# Patient Record
Sex: Male | Born: 1945
Health system: Southern US, Community
[De-identification: ages and names within clinical notes are randomized; demographics above are authoritative.]

## PROBLEM LIST (undated history)

## (undated) DIAGNOSIS — J439 Emphysema, unspecified: Secondary | ICD-10-CM

## (undated) DIAGNOSIS — R52 Pain, unspecified: Secondary | ICD-10-CM

## (undated) DIAGNOSIS — J449 Chronic obstructive pulmonary disease, unspecified: Secondary | ICD-10-CM

## (undated) DIAGNOSIS — G479 Sleep disorder, unspecified: Secondary | ICD-10-CM

## (undated) DIAGNOSIS — E7800 Pure hypercholesterolemia, unspecified: Secondary | ICD-10-CM

## (undated) DIAGNOSIS — I219 Acute myocardial infarction, unspecified: Secondary | ICD-10-CM

## (undated) DIAGNOSIS — I2510 Atherosclerotic heart disease of native coronary artery without angina pectoris: Secondary | ICD-10-CM

## (undated) DIAGNOSIS — N3281 Overactive bladder: Secondary | ICD-10-CM

## (undated) DIAGNOSIS — I4891 Unspecified atrial fibrillation: Secondary | ICD-10-CM

## (undated) DIAGNOSIS — N400 Enlarged prostate without lower urinary tract symptoms: Secondary | ICD-10-CM

## (undated) DIAGNOSIS — C801 Malignant (primary) neoplasm, unspecified: Secondary | ICD-10-CM

## (undated) DIAGNOSIS — Z87891 Personal history of nicotine dependence: Secondary | ICD-10-CM

## (undated) HISTORY — PX: TONSILLECTOMY: SUR1361

## (undated) HISTORY — DX: Acute myocardial infarction, unspecified: I21.9

## (undated) HISTORY — PX: CORONARY ANGIOPLASTY WITH STENT PLACEMENT: SHX49

## (undated) HISTORY — DX: Pure hypercholesterolemia, unspecified: E78.00

## (undated) HISTORY — DX: Atherosclerotic heart disease of native coronary artery without angina pectoris: I25.10

## (undated) HISTORY — PX: CORONARY ARTERY BYPASS GRAFT: SHX141

---

## 1997-05-29 ENCOUNTER — Observation Stay (HOSPITAL_COMMUNITY): Admission: AD | Admit: 1997-05-29 | Discharge: 1997-05-30 | Payer: Self-pay | Admitting: Cardiology

## 2002-06-04 ENCOUNTER — Ambulatory Visit (HOSPITAL_COMMUNITY): Admission: RE | Admit: 2002-06-04 | Discharge: 2002-06-04 | Payer: Self-pay | Admitting: Internal Medicine

## 2002-07-18 ENCOUNTER — Ambulatory Visit (HOSPITAL_COMMUNITY): Admission: RE | Admit: 2002-07-18 | Discharge: 2002-07-19 | Payer: Self-pay | Admitting: Cardiology

## 2002-07-24 ENCOUNTER — Ambulatory Visit (HOSPITAL_COMMUNITY): Admission: RE | Admit: 2002-07-24 | Discharge: 2002-07-25 | Payer: Self-pay | Admitting: Cardiology

## 2003-02-06 ENCOUNTER — Ambulatory Visit (HOSPITAL_COMMUNITY): Admission: RE | Admit: 2003-02-06 | Discharge: 2003-02-06 | Payer: Self-pay | Admitting: Cardiology

## 2003-02-24 ENCOUNTER — Inpatient Hospital Stay (HOSPITAL_COMMUNITY): Admission: RE | Admit: 2003-02-24 | Discharge: 2003-02-28 | Payer: Self-pay | Admitting: Surgery

## 2003-04-03 ENCOUNTER — Encounter (HOSPITAL_COMMUNITY): Admission: RE | Admit: 2003-04-03 | Discharge: 2003-05-03 | Payer: Self-pay | Admitting: Cardiology

## 2003-05-05 ENCOUNTER — Encounter (HOSPITAL_COMMUNITY): Admission: RE | Admit: 2003-05-05 | Discharge: 2003-06-04 | Payer: Self-pay | Admitting: Cardiology

## 2003-06-06 ENCOUNTER — Encounter (HOSPITAL_COMMUNITY): Admission: RE | Admit: 2003-06-06 | Discharge: 2003-07-06 | Payer: Self-pay | Admitting: *Deleted

## 2003-12-18 ENCOUNTER — Ambulatory Visit: Payer: Self-pay | Admitting: Cardiology

## 2004-08-16 ENCOUNTER — Ambulatory Visit: Payer: Self-pay | Admitting: Internal Medicine

## 2004-08-16 ENCOUNTER — Ambulatory Visit (HOSPITAL_COMMUNITY): Admission: RE | Admit: 2004-08-16 | Discharge: 2004-08-16 | Payer: Self-pay | Admitting: Internal Medicine

## 2004-08-16 ENCOUNTER — Encounter (INDEPENDENT_AMBULATORY_CARE_PROVIDER_SITE_OTHER): Payer: Self-pay | Admitting: Internal Medicine

## 2004-12-15 ENCOUNTER — Ambulatory Visit: Payer: Self-pay | Admitting: Cardiology

## 2005-04-01 ENCOUNTER — Ambulatory Visit (HOSPITAL_COMMUNITY): Admission: RE | Admit: 2005-04-01 | Discharge: 2005-04-01 | Payer: Self-pay | Admitting: Family Medicine

## 2006-03-20 ENCOUNTER — Ambulatory Visit: Payer: Self-pay | Admitting: Cardiology

## 2006-03-30 ENCOUNTER — Ambulatory Visit: Payer: Self-pay

## 2008-05-27 ENCOUNTER — Ambulatory Visit (HOSPITAL_COMMUNITY): Admission: RE | Admit: 2008-05-27 | Discharge: 2008-05-27 | Payer: Self-pay | Admitting: Family Medicine

## 2008-07-09 ENCOUNTER — Ambulatory Visit (HOSPITAL_COMMUNITY): Admission: RE | Admit: 2008-07-09 | Discharge: 2008-07-09 | Payer: Self-pay | Admitting: Internal Medicine

## 2009-04-22 ENCOUNTER — Encounter: Payer: Self-pay | Admitting: Cardiology

## 2010-02-21 ENCOUNTER — Encounter: Payer: Self-pay | Admitting: Surgery

## 2010-03-02 NOTE — Miscellaneous (Signed)
Summary: zetia refill  Clinical Lists Changes  Medications: Added new medication of ZETIA 10 MG TABS (EZETIMIBE) Take one tablet by mouth daily. - Signed Rx of ZETIA 10 MG TABS (EZETIMIBE) Take one tablet by mouth daily.;  #30 x 0;  Signed;  Entered by: Teressa Lower RN;  Authorized by: Gaylord Shih, MD, [facility];  Method used: Electronically to [facility]*, [address] [place], [place], [place]  [address], Ph: [phone] or [phone], Fax: [phone]    Prescriptions: ZETIA 10 MG TABS (EZETIMIBE) Take one tablet by mouth daily.  #30 x 0   Entered by:   Teressa Lower RN   Authorized by:   Gaylord Shih, MD, [facility]   Signed by:   Teressa Lower RN on 04/22/2009   Method used:   Electronically to        [facility]* (retail)       [address]       [place], [place]  [address]       Ph: [phone] or [phone]       Fax: [phone]   RxID:   [id number]

## 2010-06-18 NOTE — Op Note (Signed)
NAME:  Cody Coleman, Cody Coleman                          ACCOUNT NO.:  [id number]   MEDICAL RECORD NO.:  [id number]                   PATIENT TYPE:  INP   LOCATION:  2311                                 FACILITY:  [facility]   PHYSICIAN:  Evelene Croon, M.D.                  DATE OF BIRTH:  [birth year]   DATE OF PROCEDURE:  02/24/2003  DATE OF DISCHARGE:                                 OPERATIVE REPORT   PREOPERATIVE DIAGNOSES:  Severe three-vessel coronary artery disease.   POSTOPERATIVE DIAGNOSES:  Severe three-vessel coronary artery disease.   OPERATION PERFORMED:  Median sternotomy, extracorporeal circulation,  coronary artery bypass grafting surgery times six using a left internal  mammary artery graft to the left anterior descending coronary artery, with a  saphenous vein graft to the diagonal branch of the left anterior descending,  sequential saphenous vein graft to the first major obtuse marginal coronary  artery and then the distal left circumflex coronary artery, and the  sequential saphenous vein graft to the distal right coronary artery and  posterior descending coronary artery.  Endoscopic vein harvesting from the  right leg.   SURGEON:  Alleen Borne, M.D.   ASSISTANT:  Carmin Muskrat. Eustaquio Boyden.   ANESTHESIA:  General endotracheal.   INDICATIONS FOR PROCEDURE:  The patient is a 65 year old gentleman with a  history of coronary disease, status post stenting of his right coronary  artery in June of 2004.  He also had cutting balloon angioplasty of the  posterior descending coronary artery at that time.  He apparently had  disease in the left circumflex and LAD at that time.  His ejection fraction  was 60%.  He denies any significant chest pain or pressure but has had  progressive fatigue.  He underwent a stress Cardiolite exam recently that  was clinically negative but electrically positive for ischemia.  The nuclear  images showed evidence of inferior, inferolateral and lateral  ischemia.  Left ventricular ejection fraction was calculated at 46%.  He underwent  cardiac catheterization on February 06, 2003.  The left main was normal. The  LAD had 70% proximal stenosis just before the take off of the diagonal  branch.  The diagonal branch had about 80% ostial stenosis.  The left  circumflex was a large vessel.  There was a large first marginal that had  80% stenosis.  The AV groove portion had 70% stenosis after a small third  marginal branch and this stenosis compromised the large distal left  circumflex branch.  The right coronary artery had 50% ostial stenosis.  The  previously stented area in the proximal vessel was widely patent but  irregular.  The proximal posterior descending coronary artery had 90%  stenosis.  Left ventricular ejection fraction was about 49% with  anterolateral hypokinesis.  There was no aortic stenosis or mitral  regurgitation.  After review of the angiogram and examination of the  patient, it was felt that coronary artery bypass graft surgery was the best  treatment.  I discussed the operative procedure with the patient and his  wife including alternatives, benefits, and risks including bleeding,  possible blood transfusion, infection, stroke, myocardial infarction and  death.  They understood and agreed to proceed.   DESCRIPTION OF PROCEDURE:  The patient was taken to the operating room and  placed on the table in supine position.  After induction of general  endotracheal anesthesia, a Foley catheter was placed in the bladder using  sterile technique.  Then the chest, abdomen and both lower extremities were  prepped and draped in the usual sterile manner.  The chest was entered  through a median sternotomy incision and the pericardium opened in the  midline.  Examination of the heart showed good ventricular contractility.  The ascending aorta had no palpable plaques in it.   Then the left internal mammary artery was harvested from the  chest wall as a  pedicle graft.  This was a medium caliber vessel with excellent blood flow  through it.  At the same time, a segment of greater saphenous vein was  harvested from the right leg using endoscopic vein harvest technique.  This  vein was of medium caliber and good quality.   Then the patient was heparinized and when an adequate activated clotting  time was achieved, the distal ascending aorta was cannulated using a 20  French aortic cannula for arterial inflow.  Venous outflow was achieved  using a two-stage venous cannula for the right atrial appendage.  An  antegrade cardioplegia and vent cannula was inserted in the aortic root.   The patient was placed on cardiopulmonary bypass and the distal coronary  arteries identified.  The LAD was a large graftable vessel.  The diagonal  branch had two subbranches.  The more medial subbranch was tiny and  diffusely diseased and nongraftable.  The more lateral subbranch was a  medium sized graftable vessel.  The first marginal was a large graftable  vessel with minimal distal disease.  The distal left circumflex terminated  as a large branch that was graftable.  The right coronary artery was  diffusely diseased.  The right coronary artery just before the takeoff of  the posterior descending branch was soft and had minimal disease.  The  proximal portion of the posterior descending coronary artery was diseased  but the remainder of the vessel appeared relatively free of disease.  There  were three small posterolateral branches.   Then the aorta was crossclamped and 500 mL of cold blood antegrade  cardioplegia was administered in the aortic root with quick arrest of the  heart.  Systemic hypothermia to 20 degrees centigrade and topical  hypothermia with iced saline was used.  A temperature probe was placed on  the septum and insulating pad in the pericardium.  The first distal anastomosis was performed to the major obtuse marginal   branch.  The internal diameter of this vessel was about 1.75 mm.  The  conduit used was a segment of greater saphenous vein and anastomosis  performed in a sequential side-to-side manner using continuous 7-0 Prolene  suture.  Flow was measured through the graft and was excellent.   The second distal anastomosis was performed to the distal left circumflex  coronary artery.  The internal diameter of this vessel was about 1.75 mm.  The conduit used was the same segment of greater saphenous vein and  anastomosis was performed  in a sequential end-to-side manner using  continuous 7-0 Prolene suture.  Flow was measured through the graft and was  excellent. Then another dose of cardioplegia was given down the vein graft  and in the aortic root.   The third distal anastomosis was performed to the distal right coronary  artery.  The internal diameter was about 2.5 mm.  The conduit used was a  second segment of greater saphenous vein and the anastomosis performed in a  sequential side-to-side manner using continuous 7-0 Prolene suture.  Flow  was measured through the graft and was excellent.   The fourth distal anastomosis was performed to the posterior descending  coronary artery.  The internal diameter of this vessel was 1.6 mm.  The  conduit used was the same segment of greater saphenous vein.  The  anastomosis was performed in a sequential end-to-side manner using  continuous 7-0 Prolene suture.  Flow was measured through the graft and was  excellent.  Then another dose of cardioplegia was given down the vein graft  and in the aortic root.   The fifth distal anastomosis was performed to the diagonal branch.  The  internal diameter of this vessel was 1.5 mm.  The conduit used was a third  segment of greater saphenous vein.  The anastomosis was performed in an end-  to-side manner using continuous 7-0 Prolene suture.  Flow was measured  through the graft and was excellent.   The sixth distal  anastomosis was performed to the midportion of the left  anterior descending coronary artery.  The internal diameter of this vessel  was about 2 mm.  The conduit used was the left internal mammary artery graft  and this was brought through an opening in the left pericardium anterior to  the phrenic nerve.  It was anastomosed to the LAD in end-to-side manner  using continuous 8-0 Prolene suture.  The pedicle was tacked to the  epicardium with 6-0 Prolene sutures.  The patient was rewarmed to 37 degrees  centigrade. With a cross-clamp in place, the three proximal vein graft  anastomoses were performed to the aortic root in end-to-side manner using  continuous 6-0 Prolene suture.  Then the clamp was removed from the mammary  pedicle.  There was rapid warming of the ventricular septum and return of  spontaneous ventricular fibrillation.  The crossclamp was removed with time of 93 minutes and the patient spontaneously converted into sinus rhythm.  The proximal and distal anastomoses appeared hemostatic and the line of the  graft satisfactory.  Graft markers were placed around the proximal  anastomoses.  Two temporary right ventricular and right atrial pacing wires  placed and brought out through the skin.   When the patient had rewarmed to 37 degrees centigrade, he was weaned from  cardiopulmonary bypass on no inotropic agents.  Total bypass time was 109  minutes.  Cardiac function appeared excellent with a cardiac output of 5L  per minute.  Protamine was given and the venous and aortic cannulas were  removed without difficulty.  Hemostasis was achieved.  Three chest tubes  were placed with a tube in the posterior pericardium, one in the left  pleural space and one in the anterior mediastinum.  The pericardium was  easily approximated over the heart.  The sternum was closed with #6  stainless steel wires.  The fascia was closed with continuous #1 Vicryl  suture.  The subcutaneous tissue was  closed with continuous 2-0 Vicryl and  the  skin with 3-0 Vicryl subcuticular closure.  The lower extremity vein  harvest site was closed in layers in a similar manner.  Sponge, needle and  instrument counts were correct according to the scrub nurse.  Dry sterile  dressings were applied over the incisions, around the chest tubes which were  hooked to Pleur-evac suction.  The patient remained hemodynamically stable  and was transported to the SICU in guarded but stable condition.                                               Evelene Croon, M.D.    BB/MEDQ  D:  02/24/2003  T:  02/25/2003  Job:  [id number]   cc:   Thomas C. Wall, M.D.   Cath lab

## 2010-06-18 NOTE — Assessment & Plan Note (Signed)
[facility]                            CARDIOLOGY OFFICE NOTE   ERIVERTO, BYRNES                       MRN:          [id number]  DATE:03/20/2006                            DOB:          October 15, 1945    Cody Coleman returns today for further management of the following issues:   1. Coronary artery disease.  He is status post coronary artery bypass      grafting February 24, 2003.  He had a negative stress Myoview with      excellent exercise tolerance in November 2005.  2. Hyperlipidemia.  He has done remarkably well with about a 25-pound      weight loss, a combination of Lipitor and Zetia, and also stopping      smoking.  His HDL is now up to 65!  His LDL is 58.  3. Remote tobacco.   His medications are unchanged since last visit.   He is exercising on a regular basis.  He cut up 5 trees the other day  without any problem.   PHYSICAL EXAMINATION:  VITAL SIGNS:  His blood pressure is 124/78, his  pulse is 73 and regular, his weight is 184, up 6.  HEENT:  He is bearded, normocephalic, atraumatic.  PERRLA.  Extraocular  movements intact.  Sclerae are clear.  NECK:  Supple.  Carotid upstrokes are equal bilaterally without bruits.  There is no JVD.  Thyroid is not enlarged.  The trachea is midline.  LUNGS:  Clear.  CARDIAC:  His sternotomy site is stable.  PMI is nondisplaced.  Normal  S1-S2 without murmur, rub, or gallop.  ABDOMEN:  Soft, good bowel sounds, no midline bruit.  There is no  hepatomegaly.  EXTREMITIES:  No clubbing, cyanosis, or edema.  Pulses are intact.  NEUROLOGIC:  Intact.  SKIN:  Intact.   I am delighted with how Cody Coleman has done.  He gives a whole new  meaning to TLC!   PLAN:  1. Exercise rest-stress Myoview.  2. See me back in a year.     Thomas C. Daleen Squibb, MD, [facility]  Electronically Signed   TCW/MedQ  DD: 03/20/2006  DT: 03/20/2006  Job #: [id number]   cc:   Patrica Duel, M.D.

## 2010-06-18 NOTE — Cardiovascular Report (Signed)
NAME:  Cody Coleman, Cody Coleman                          ACCOUNT NO.:  [id number]   MEDICAL RECORD NO.:  [id number]                   PATIENT TYPE:  OIB   LOCATION:  2899                                 FACILITY:  [facility]   PHYSICIAN:  Salvadore Farber, M.D.             DATE OF BIRTH:  02/01/1945   DATE OF PROCEDURE:  02/06/2003  DATE OF DISCHARGE:                              CARDIAC CATHETERIZATION   PROCEDURE:  Left heart catheterization, left ventriculography, coronary  angiography.   INDICATIONS:  Mr. Turano is a 65 year old gentleman with hypertension and  dyslipidemia who initially presented after an abnormal Cardiolite done as  part of a preoperative evaluation for sinus surgery.  This was performed  back in June of 2004.  Demonstrated inferolateral ischemia leading to  cardiac catheterization.  This demonstrated severe stenosis of an obtuse  marginal for which recommendation was to treat medically.  There was also  severe stenosis of the proximal RCA which was treated with a drug-eluting  stent and severe stenosis of the PDA which was treated with balloon  angioplasty.  He now presents with fatigue which prompted a repeat  Cardiolite.  This, again, demonstrated inferolateral ischemia and he is  referred for repeat cardiac catheterization.   PROCEDURAL TECHNIQUE:  Informed consent was obtained.  Under 1% lidocaine  local anesthesia a 6-French sheath was placed in the right femoral artery  using the modified Seldinger technique.  Diagnostic angiography and  ventriculography were performed using JL5, no-torque right, and pigtail  catheters.  The patient tolerated the procedure well and was transferred to  the holding room in stable condition.  Sheaths are to be removed there.   COMPLICATIONS:  None.   FINDINGS:  1. LV 146/22/26.  EF 49% with anterolateral hypokinesis.  2. No aortic stenosis or mitral regurgitation.  3. Left main:  Angiographically normal.  4. LAD:  The LAD is a  moderate sized, diffusely diseased vessel giving rise     to a single diagonal branch.  There is a 70% stenosis of the proximal LAD     just proximal to the takeoff of the diagonal branch.  The diagonal has an     ostial 80% stenosis.  A medial division of this diagonal is diffusely     diseased with up to 90% stenosis in a small vessel.  5. Circumflex:  Relatively large vessel giving rise to three __________     obtuse marginals.  The first substantial marginal has an 80% stenosis     which is unchanged from prior catheter.  The AV groove circumflex has a     70% stenosis after a small third marginal.  6. RCA:  The RCA is a dominant vessel.  There is a 50% ostial stenosis.  The     previously placed stent in the proximal vessel is widely patent.     However, the proximal PDA previously treated with balloon angioplasty  had     severe stenosis of up to 90%.   IMPRESSION/RECOMMENDATIONS:  The patient has severe multivessel coronary  disease with mildly impaired ventricular function.  He has restenosis of the  posterior descending artery, stable, severe disease of an obtuse marginal,  and substantial progression of his left anterior descending over the past  six months.  Each of these three lesions is challenging to treat  percutaneously.  I have therefore recommended coronary artery bypass  grafting.                                               Salvadore Farber, M.D.    WED/MEDQ  D:  02/06/2003  T:  02/06/2003  Job:  [id number]   cc:   Patrica Duel, M.D.  [address]  [place]  [place] [address]  Fax: [phone]   Jesse Sans. Wall, M.D.   Evelene Croon, M.D.  [address]  [place]  [place] [address]

## 2010-06-18 NOTE — Discharge Summary (Signed)
NAME:  ABDULAH, Cody Coleman                          ACCOUNT NO.:  [id number]   MEDICAL RECORD NO.:  [id number]                   PATIENT TYPE:  OIB   LOCATION:  6531                                 FACILITY:  [facility]   PHYSICIAN:  Veneda Melter, M.D.                   DATE OF BIRTH:  07/30/1945   DATE OF ADMISSION:  07/24/2002  DATE OF DISCHARGE:  07/25/2002                                 DISCHARGE SUMMARY   DISCHARGE DIAGNOSES:  Coronary artery disease.   PROCEDURE PERFORMED:  PTCA and stent placement in proximal right coronary  artery with PTCA via posterior descending artery on July 24, 2002,  replacement of a 3.0 x 32 mm Taxus stent in the proximal RCA.   HISTORY:  [name] is a 65 year old gentleman requiring sinus surgery who  presented for preoperative clearance. The patient underwent cardiac  catheterization on July 18, 2002 showing severe two-vessel coronary artery  disease and well preserved LV function. He subsequently underwent a stress  injury study showing ischemia in the inferior wall, and he presented for  percutaneous intervention. This was performed on July 24, 2002, placing a  3.0 x 32 mm Taxus stent in the proximal right coronary artery and cutting  balloon angioplasty to the proximal PDA, using a 2.25-mm balloon. He  received an excellent result and was continued on antiplatelet therapy  overnight. He was started on Plavix and will be continued for six months. He  had slight hypotension overnight requiring reduction of his lisinopril  dosage. The following morning, he had no elevation of cardiac enzymes, no  change in his ECG. He was ambulating without difficulty and had no chest  pain or shortness of breath. He was deemed stable for discharge. He was  discharged in stable condition.   DISCHARGE MEDICATIONS:  1. Aspirin 325 mg per day.  2. Plavix 75 mg per day for six months.  3. Lipitor 40 mg per day.  4. Zetia 10 mg q.d.  5. Pindolol 5 mg b.i.d.  6.  Lisinopril 20 mg q.h.s. The patient previously had been on 40 mg, and     this may be adjusted as needed.   DIET:  Low fat, low cholesterol.   ACTIVITY:  No driving or lifting for three days. He is okay to return to  work on Monday, June 28. Followup will be with Dr. Daleen Squibb in eight weeks. He  will receive a treadmill stress test in six weeks to ensure that he has no  remaining ischemic burden. His surgical intervention to his sinuses will be  deferred for six months.                                               Veneda Melter, M.D.  NG/MEDQ  D:  07/25/2002  T:  07/26/2002  Job:  [id number]   cc:   Donzetta Sprung  [address]  [place]  [place] [address]  Fax: [phone]   Veverly Fells. Arletha Grippe, M.D.  [address]  [place]  [place] [address]  Fax: [phone]   Jesse Sans. Wall, M.D.    cc:   Donzetta Sprung  [address]  [place]  [place] [address]  Fax: [phone]   Veverly Fells. Arletha Grippe, M.D.  [address]  [place]  [place] [address]  Fax: [phone]   Jesse Sans. Wall, M.D.

## 2010-06-18 NOTE — Consult Note (Signed)
NAME:  Cody Coleman, Cody Coleman NO.:  [id number]   MEDICAL RECORD NO.:  [id number]                  PATIENT TYPE:   LOCATION:                                       FACILITY:   PHYSICIAN:  Lionel December, M.D.                 DATE OF BIRTH:  07/06/1945   DATE OF CONSULTATION:  DATE OF DISCHARGE:                                   CONSULTATION   REFERRING PHYSICIAN:  Dorthea Cove, M.D.   REASON FOR GASTROINTESTINAL CONSULTATION:  Difficulty swallowing pills.   HISTORY OF PRESENT ILLNESS:  The patient is a 65 year old Caucasian  gentleman who presents today for further evaluation of dysphagia primarily  to pills.  He recently saw Dr. Veverly Fells. Arletha Grippe with [facility] for further evaluation of chronic sinusitis.  He is scheduled  for sinus surgery and deviated septum repair on 06/19/02.  The patient had  mentioned difficulty swallowing and it was recommended that he be seen by a  gastroenterologist.  The patient states he has had this for several months.  He typically has problems swallowing pills.  He feels like it is becoming  lodged in his throat.  It dissolves and eventually the feeling goes away.  He also has difficulty with swallowing small foods but not really meat or  bread.  Sometimes the pill will come back up almost immediately.  He denies  any odynophagia, nausea or vomiting, heartburn, abdominal pain,  constipation, diarrhea, melena, rectal bleeding. He has a history of peptic  ulcer disease in the 1970s diagnosed by upper endoscopy.  He had a  colonoscopy in 1990.   CURRENT MEDICATIONS:  1. Lipitor 40 mg daily.  2. Zestril 40 mg daily.  3. Multivitamin daily.  4. Aspirin 81 mg daily.   ALLERGIES:  AUGMENTIN causes GI upset.   PAST MEDICAL HISTORY:  1. Hypertension.  2. Hypercholesterolemia.  3. History of peptic ulcer disease in 1978.  4. History of mildly elevated ALT in the setting of frequent alcohol use,  followed by Dr. Dorthea Cove.  5. Status post angioplasty in 1985.  6. Tonsillectomy.  7. History of ischemic heart disease with EF ICT percent per Dr. Garner Nash     note.   FAMILY HISTORY:  Father had a stroke.  No family history of colorectal  cancer or chronic GI illness or liver disease.   SOCIAL HISTORY:  He has been married for 28 years.  Has one daughter.  He is  employed with [employer].  He smokes 1-1 and 1/2 packs of cigarettes  daily and has smoked for most of his life.  He consumes alcohol  approximately three times weekly.   REVIEW OF SYSTEMS:  Please see HPI for GI.  GENERAL:  Denies any weight  loss.  CARDIOPULMONARY:  Denies any chest pain or shortness of breath.   PHYSICAL EXAMINATION:  VITAL SIGNS:  Weight 192, height 5' 8, temp 98,  blood pressure 110/70, pulse 80.  GENERAL:  A pleasant, well nourished, well developed, Caucasian male in no  acute distress.  SKIN:  Warm and dry and no jaundice.  HEENT:  Pupils equal, round, reactive to light.  Conjunctivae are pink.  Sclera nonicteric.  Oropharyngeal mucosa moist and pink.  No lesions,  erythema or exudate.  No lymphadenopathy, thyromegaly.  CHEST:  Lungs are clear to auscultation.  CARDIAC:  Reveals regular, rate and rhythm, normal  S1, S2.  No murmurs,  rubs or gallops.  ABDOMEN:  Positive bowel sounds.  Soft, nontender, nondistended.  No  organomegaly or masses.  Liver edge easily palpable 7-8 cm below the right  costal margin in the midclavicular line.  Spleen not appreciated.  EXTREMITIES:  No edema.   IMPRESSION:  1. The patient is a 65 year old gentleman who has difficulty swallowing     pills and small foods.  At times the pills do come back up.  He may have     a proximal esophageal stricture.  Although we can not rule out increased     diverticulum.  Initially would like to proceed with EGD and possible     dilatation.  He may ultimately need barium swallow to rule out increased      diverticulum based on upper endoscopy findings.  2. On examination his liver is easily palpable 7-8 cm below the right costal     margin midclavicular line.  He also has a history of elevated liver     function tests and frequent alcohol use.   PLAN:  1. EGD.  2. Dr. Karilyn Cota to examine the patient at the time of the endoscopy.  He may     need to have an abdominal ultrasound for further evaluation of liver if     indeed felt to be enlarged.  3. Further recommendations to follow.   I would like to thank Dr. Garner Nash for allowing [place] to participate in the care  of this patient.     Tana Coast, P.A.                        Lionel December, M.D.    LL/MEDQ  D:  05/28/2002  T:  05/28/2002  Job:  [id number]   cc:   Dorthea Cove  [address]  [place]  [place] [address]  Fax: [phone]

## 2010-06-18 NOTE — Cardiovascular Report (Signed)
NAME:  ABBIE, JABLON                          ACCOUNT NO.:  [id number]   MEDICAL RECORD NO.:  [id number]                   PATIENT TYPE:  OIB   LOCATION:  2899                                 FACILITY:  [facility]   PHYSICIAN:  Learta Codding, M.D.                 DATE OF BIRTH:  Mar 16, 1945   DATE OF PROCEDURE:  DATE OF DISCHARGE:  02/06/2003                              CARDIAC CATHETERIZATION   REFERRING PHYSICIAN:  Donzetta Sprung, M.D.   PROCEDURES PERFORMED:  1. Left heart catheterization.  2. Ventriculography.   DIAGNOSES:  1. Multivessel coronary artery disease involving high-grade lesions on the     circumflex and right coronary artery.  2. __________  systolic function.   CARDIOLOGIST:  Learta Codding, M.D.   DESCRIPTION OF THE PROCEDURE:  After informed consent was obtained the  patient was brought to the catheterization laboratory.  A 6 French sheath  was placed using the modified Seldinger technique.  Six family history  Judkins catheters were used.  Coronary angiography was performed in various  projections using manual injection of contrast.  Ventriculography was  performed using a 6 French pigtail catheter using power injection.   COMPLICATIONS:  No complications were encountered.   FINDINGS:   HEMODYNAMIC DATA:  Left ventricular pressure 101/4 mmHg.  Aortic pressure  101/66 mmHg.  Ejection fraction 60%.  No wall motion abnormality.   ANGIOGRAPHIC DATA:  Selective Coronary Angiography:  The left main coronary artery was large and showed no evidence of  significant disease.   The left anterior descending artery was a large caliber vessel with a lesion  of 70% in the mid vessel prior to the first diagonal.  The diagonal proper  had an ostial 90% lesion.   Left circumflex coronary artery gave rise to a small obtuse marginal branch  and the second obtuse marginal branch had a 90% proximal stenosis.   The right coronary artery is a large caliber vessel and coronary  anatomy was  right dominant.  There was a proximal 80% stenosis, 90% in the proximal PDA.   Angiographically, __________.  Proceed with PCI of the RCA and possibly the  circumflex coronary artery.  ________ LAD __________.                                               Learta Codding, M.D.    GED/MEDQ  D:  03/26/2003  T:  03/27/2003  Job:  [id number]

## 2010-06-18 NOTE — Op Note (Signed)
NAMECARLON, CHALOUX                ACCOUNT NO.:  [id number]   MEDICAL RECORD NO.:  [id number]          PATIENT TYPE:  AMB   LOCATION:  DAY                           FACILITY:  [facility]   PHYSICIAN:  Lionel December, M.D.    DATE OF BIRTH:  1945/07/28   DATE OF PROCEDURE:  08/16/2004  DATE OF DISCHARGE:                                 OPERATIVE REPORT   PROCEDURE:  Colonoscopy.   INDICATIONS:  Slayter is a 65 year old Caucasian male who is here for  screening colonoscopy. Family history is negative for CRC. Procedure and  risks were reviewed with the patient, informed consent was obtained.   PREMEDICATION:  Demerol 50 milligrams IV, Versed 5 milligrams IV in divided  dose.   FINDINGS:  Procedure performed in endoscopy suite. The patient's vital signs  and O2 sat were monitored during procedure and remained stable. The patient  was placed left lateral position and rectal examination performed. No  abnormality noted external or digital exam. Olympus videoscope was placed  rectum and advanced under vision into sigmoid colon beyond. Fairly redundant  sigmoid colon. Preparation was felt to be satisfactory. Scope was advanced  into cecum which was identified by appendiceal orifice and ileocecal valve.  Pictures taken for the record. There was single small polyp across the  ileocecal valve which was ablated via cold biopsy. As the scope was  withdrawn colonic mucosa was examined for the second time and was normal.  Rectal mucosa similarly was normal. Scope was retroflexed to examine  anorectal junction which was unremarkable. Endoscope was straightened and  withdrawn. The patient tolerated the procedure well.   Small cecal polyp ablated via cold biopsy. Rest of the examination was  normal.   RECOMMENDATIONS:  Standard instructions given. He will resume his ASA and  other meds. I will be contacting the patient with biopsy results and further  recommendations. Sent a small copy to Dr.  presents       NR/MEDQ  D:  08/16/2004  T:  08/16/2004  Job:  [id number]   cc:   Patrica Duel, M.D.  [address]  [place]  [place] [address]  Fax: [phone]

## 2010-06-18 NOTE — Discharge Summary (Signed)
NAME:  ANTWAIN, CALIENDO                          ACCOUNT NO.:  [id number]   MEDICAL RECORD NO.:  [id number]                   PATIENT TYPE:  INP   LOCATION:  2027                                 FACILITY:  [facility]   PHYSICIAN:  Evelene Croon, M.D.                  DATE OF BIRTH:  [birth year]   DATE OF ADMISSION:  02/24/2003  DATE OF DISCHARGE:  02/28/2003                                 DISCHARGE SUMMARY   ADMISSION DIAGNOSIS:  Severe three vessel coronary artery disease.   PAST MEDICAL HISTORY:  1. Hypercholesterolemia.  2. Coronary artery disease, status post myocardial infarction in 1985 and     1993.  3. Status post tonsillectomy.  4. Status post hydrocele repair.  5. Hypertension.   ALLERGIES:  Augmentin.   DISCHARGE DIAGNOSIS:  Three vessel coronary artery disease status post  coronary artery bypass grafting x6   BRIEF HISTORY:  This is a 65 year old male with a history of coronary artery  disease status post stenting of the right coronary artery in June 2004. The  patient also had cutting balloon angioplasty of the posterior descending  artery at that time. The patient apparently had disease in the left  circumflex and LAD at that time and his ejection fraction was 60%. The  patient underwent a recent stress Cardiolite exam, which was clinically  negative, but electrically positive for ischemia. Nuclear images showed  evidence of inferior, inferolateral, and lateral ischemia and possible scar  versus stunting in the basilar portions. The left ventricular ejection  fraction was calculated at 46%. The patient then underwent cardiac  catheterization by Dr. Samule Ohm on February 06, 2003. This revealed severe three  vessel coronary artery disease. The patient was then referred to Dr. Evelene Croon for evaluation of possible surgical revascularization. After  examining the patient and reviewing the information it was Dr. Sharee Pimple  opinion that the patient should indeed undergo coronary  artery bypass  grafting.   HOSPITAL COURSE:  The patient was admitted and taken to the OR on February 24, 2003 for coronary artery bypass graft x6. The left internal mammary was  grafted to the LAD, saphenous vein graft to the distal right coronary artery  in sequence to the PDA, saphenous vein graft in sequence to OM1 and OM2, and  saphenous vein graft to the diagonal. Endoscopic vein harvest was performed  on the right leg. The patient tolerated the procedure well and was  hemodynamically stable immediately postoperatively. The patient was  extubated without problem and woke up from anesthesia neurologically intact.  The patient was then transported in stable condition to the SICU. The  patient's postoperative course has progressed as expected. Chest tubes and  invasive lines were discontinued on postoperative day one without  complication. The patient was also transferred to the step down unit on  postoperative day one. The patient initiated cardiac rehab on day one and  tolerated this well. The patient had initially been placed on Lopressor  postoperatively, however, on postoperative day two the patient's systolic  blood pressure was consistently in the 90s and therefore the Lopressor was  discontinued. The patient received a smoking cessation consult on  postoperative day two and the patient is currently using Nicotine patches to  try to quit smoking. On postoperative day three, the day prior to discharge,  the patient is afebrile and vital signs are stable with a blood pressure of  117/63, heart rate 97, respirations 20 with O2 saturation of 94% on room  air. The patient's lungs were clear and the heart was regular rate and  rhythm. The incisions were healing well. The patient has progressed rapidly  and is in stable condition. The patient should be ready for discharge on the  morning of February 28, 2003 on p.o. medications and in stable condition. If  the patient's blood pressures  remain within normal range ACE inhibitor will  be held, however, the ACE inhibitor may be started as an outpatient if the  blood pressure rises. The patient will be sent home on Colace for several  weeks due to the fact that he is having small bowel movements.   LABORATORY DATA:  CBC on February 26, 2003 white count 7.9, hemoglobin 8.6,  hematocrit 24.7, platelets 127. BMP on February 26, 2003 sodium 133,  potassium 3.9, BUN 25, creatinine 1.3, glucose 119. Calcium 7.5.   CONDITION ON DISCHARGE:  Improved.   DISCHARGE INSTRUCTIONS:   MEDICATIONS:  The patient is to resume the following medications:  1. Aspirin 235 mg p.o. daily.  2. Zetia 10 mg p.o. daily.  3. Lipitor 40 mg p.o. daily.  4. Nicotine patches.  5. Xanax 0.5 mg 1 p.o. t.i.d. p.r.n.  6. Colace 100  mg two tabs p.o. daily  7. The patient was given a prescription for Tylox 1-2 p.o. q.4-6h. p.r.n.     pain.   ACTIVITY:  No driving, no strenuous activity. The patient is to continue  daily breathing and walking exercises.   DIET:  Low-salt, low-fat, and low-chortosterol.   WOUND CARE:  Shower daily and clean incisions with soap and water.   SPECIAL INSTRUCTIONS:  If the incisions are to become red, swollen, or  drain, or if the patient has a fever of 101 degrees Fahrenheit he is to call  the CVTS office at [phone].   DISCHARGE FOLLOW UP:  1. Dr. Laneta Simmers on April 08, 2003 at 11:30 a.m. The patient will go to the     [facility] at 10:30 on April 08, 2003 for a chest x-ray.     The patient will need to bring that chest x-ray with him to the     appointment with Dr. Laneta Simmers.  2. Dr. Daleen Squibb two weeks after discharge. The patient is to call and setup that     appointment.      Pecola Leisure, [place]                      Evelene Croon, M.D.    AY/MEDQ  D:  02/27/2003  T:  02/28/2003  Job:  [id number]   cc:   Evelene Croon, M.D.  [address]  [place]  [place] [address]   Jesse Sans. Wall, M.D.

## 2010-06-18 NOTE — H&P (Signed)
NAME:  Cody Coleman, Cody Coleman                          ACCOUNT NO.:  [id number]   MEDICAL RECORD NO.:  [id number]                   PATIENT TYPE:  OIB   LOCATION:  2899                                 FACILITY:  [facility]   PHYSICIAN:  Salvadore Farber, M.D.             DATE OF BIRTH:  1945-10-15   DATE OF ADMISSION:  02/06/2003  DATE OF DISCHARGE:                                HISTORY & PHYSICAL   CHIEF COMPLAINT:  Chest pain, abnormal Cardiolite.   HISTORY OF PRESENT ILLNESS:  Cody Coleman is a 65 year old male with a history  of coronary artery disease.  He has had PTCA and stent in the proximal RCA,  as well as PTCA in the PDA in June 2004.  He was complaining of dyspnea and  fatigue, and he had a stress Cardiolite on January 15, 2003, and was seen  by Dr. Daleen Squibb at that time.  The Cardiolite showed decreased tracer activity  in the inferior wall, inferolateral wall, and lateral wall with stress.  In  recovery images, there was near complete resolution of this except at the  basilar level.  The films were evaluated by Dr. Daleen Squibb and Dr. Samule Ohm, and it  was felt that he needed cardiac catheterization.  His EF was calculated at  46% at [facility].  He comes to the hospital today on February 06, 2003, for  the procedure.   PAST MEDICAL HISTORY:  1. MI in 1995 with PTCA, and MI in 1993.  Cutting balloon angioplasty of the     PDA with stenting of the proximal RCA performed in June 2004.  2. Hypertension.  3. Hyperlipidemia.  4. Family history of coronary artery disease.  5. Tobacco use, recently quit.  6. History of sinus problems.   PAST SURGICAL HISTORY:  1. Status post tonsillectomy.  2. Status post EGD with esophageal dilatation.  3. Cardiac catheterization x3.   ALLERGIES:  AUGMENTIN.   MEDICATIONS:  1. Plavix 75 mg daily.  2. Zetia 10 mg daily.  3. Lipitor 30 mg daily.  4. Xanax 0.5 mg p.r.n.  5. Aspirin daily.  6. Lisinopril 20 mg daily.  7. __________ 400 mg daily.   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  He has recently quit tobacco use and is on the patch for  this and scheduled to go the lowest patch level in the next day or two.  He  has a greater than 30 pack year history of tobacco.  He does not abuse  alcohol or drugs.  He does not exercise regularly.   FAMILY HISTORY:  The family history is positive for coronary artery disease,  but it is not premature.   REVIEW OF SYSTEMS:  Positive for chronic sinus problems, but no recent  fevers or chills.  He denies any hematemesis, hemoptysis, or melena.  He  denies any gastroesophageal  reflux disease symptoms.  Review of systems is  otherwise negative.   PHYSICAL EXAMINATION:  VITAL SIGNS:  Temperature is 97.6, blood pressure  120/73, heart rate 82, respiratory rate 16, O2 saturation 98% on room air.  GENERAL:  He is a well-developed, slightly overweight white male in no acute  distress.  HEENT:  His head is normocephalic, atraumatic with pupils equal, round,  reactive to light and accommodation.  Extraocular movements are intact.  NECK:  There is no thyromegaly or JVD.  CHEST:  Clear to auscultation bilaterally.  CARDIOVASCULAR:  Regular rate and rhythm with no significant murmur, rub, or  gallop noted.  ABDOMEN:  Soft and nontender with active bowel sounds.  EXTREMITIES:  There is no edema noted, and distal pulses are 2+ in all four  extremities.  NEUROLOGIC:  He is alert and oriented with no focal deficits noted.   ASSESSMENT AND PLAN:  His Cardiolite was abnormal in multiple areas and he  is here today for cardiac catheterization.  Further evaluation will depend  on this.  The patient is otherwise stable and will be followed by Dr. Daleen Squibb  and Dr. Donzetta Sprung.      Theodore Demark, P.A. [facility]                  Salvadore Farber, M.D.    RB/MEDQ  D:  02/06/2003  T:  02/06/2003  Job:  [id number]

## 2010-06-18 NOTE — Cardiovascular Report (Signed)
   NAME:  Cody Coleman, Cody Coleman                          ACCOUNT NO.:  [id number]   MEDICAL RECORD NO.:  [id number]                   PATIENT TYPE:  OIB   LOCATION:  2899                                 FACILITY:  [facility]   PHYSICIAN:  Salvadore Farber, M.D.             DATE OF BIRTH:  November 28, 1945   DATE OF PROCEDURE:  07/24/2002  DATE OF DISCHARGE:                              CARDIAC CATHETERIZATION   PROCEDURES PERFORMED:  1. Cutting balloon angioplasty of the posterior descending artery.  2. Stenting of the proximal right coronary artery.   CARDIOLOGIST:  Salvadore Farber, M.D.   INDICATIONS:   Dictation ended at this point.                                                 Salvadore Farber, M.D.    WED/MEDQ  D:  07/24/2002  T:  07/24/2002  Job:  [id number]  Jesse Sans. Wall, M.D.   Donzetta Sprung  [address]  [place]  [place] [address]  Fax: [phone]   Veverly Fells. Arletha Grippe, M.D.  [address]  [place]  [place] [address]  Fax: [phone]   cc:   Thomas C. Wall, M.D.   Donzetta Sprung  [address]  [place]  [place] [address]  Fax: [phone]   Veverly Fells. Arletha Grippe, M.D.  [address]  [place]  [place] [address]  Fax: [phone]

## 2010-06-18 NOTE — Cardiovascular Report (Signed)
NAME:  Cody Coleman, Cody Coleman                          ACCOUNT NO.:  [id number]   MEDICAL RECORD NO.:  [id number]                   PATIENT TYPE:  OIB   LOCATION:  6531                                 FACILITY:  [facility]   PHYSICIAN:  Salvadore Farber, M.D.             DATE OF BIRTH:  28-Aug-1945   DATE OF PROCEDURE:  07/24/2002  DATE OF DISCHARGE:  07/25/2002                              CARDIAC CATHETERIZATION   PROCEDURE:  1. Planned PCI of the right coronary artery.  2. Coronary angiography.   INDICATIONS:  Mr. Stoudt is a 65 year old gentleman who recently underwent  ETT Cardiolite as part of a preoperative evaluation.  He underwent cardiac  catheterization on July 18, 2002 by Learta Codding, M.D. showing severe two  vessel disease with preserved left ventricular systolic function.  He is now  brought back for relook angiography at the left coronary circulation to  assess the feasibility of PCI of severely diseased obtuse marginal branch as  well as planned PCI of the RCA.   PROCEDURAL TECHNIQUE:  Informed consent was obtained.  Under 1% lidocaine  local anesthesia a 7-French sheath was placed in the right femoral artery  using the modified Seldinger technique.  Anticoagulation was initiated with  Integrilin and heparin to achieve an ACT greater than 200 seconds.  Coronary  angiography was then performed of the left system using a JL4 catheter after  the administration of intracoronary nitroglycerin.  This demonstrated 70%  stenosis of the LAD as previously documented by Learta Codding, M.D.  After  the administration of intracoronary nitroglycerin the first obtuse marginal  had a 90% stenosis and was 2.0 mm vessel.  Decision was made for medical  management of this lesion.   Attention was then turned to the RCA.  A 7-French JR4 guide was advanced  over a wire and engaged in the ostium of the RCA.  A BMW wire was advanced  into the distal PDA.  I began with the PDA lesion.  Angioplasty  was  performed using a 2.25 x 10 mm cutting balloon.  This could not be delivered  to the PDA lesion.  Therefore, I inserted a 2.25 x 15 mm Maverick balloon.  This crossed readily.  Angioplasty was performed of the ostial PDA lesion  using this balloon at 6 atmospheres.  The Maverick balloon was then pulled  back to the proximal RCA and used to predilate the tightest portion of the  proximal RCA lesion at 8 atmospheres.  This then facilitated passage of the  cutting balloon.  It was advanced to the PDA ostium and inflated for a  series of three inflations at 8 atmospheres.  Attention was then turned to  the lesion of the proximal RCA.  A 3.0 x 32 mm Taxus stent was positioned  across the lesion and deployed at 14 atmospheres.  The entirety of the stent  was then post dilated  using a 3.0 x 18 mm PowerSail balloon at 16  atmospheres distally and 18 atmospheres proximally.  Final angiograms  demonstrated no residual stenosis in the proximal RCA, diffuse mild disease  in the mid RCA, and approximately 10% residual stenosis at the PDA ostium.  There was no dissection and TIMI 3 flow to the distal vasculature.   COMPLICATIONS:  None.   IMPRESSION/RECOMMENDATIONS:  Successful drug eluting stent placement in the  proximal right coronary artery with cutting balloon angioplasty of the  posterior descending artery ostium.  There is residual disease of 70%  in the left anterior descending and 90% in a long, but relatively small  diameter first obtuse marginal.  Will plan exercise tolerance testing after  six weeks to assess residual ischemic burden.  He will be continued on  Plavix for a minimum of six months.  Sinus surgery should be delayed for at  least six months.                                               Salvadore Farber, M.D.    WED/MEDQ  D:  10/17/2002  T:  10/17/2002  Job:  [id number]   cc:   Thomas C. Wall, M.D.   Donzetta Sprung  [address], [address]  [place]  [place] [address]   Fax: [phone]   Veverly Fells. Arletha Grippe, M.D.  [address]  [place]  [place] [address]  Fax: [phone]

## 2010-06-18 NOTE — Discharge Summary (Signed)
NAME:  Cody Coleman, Cody                          ACCOUNT NO.:  [id number]   MEDICAL RECORD NO.:  [id number]                   PATIENT TYPE:  OIB   LOCATION:  3705                                 FACILITY:  [facility]   PHYSICIAN:  Jerene Dilling, P.A.-C  [facility]    DATE OF BIRTH:  [birth year]   DATE OF ADMISSION:  07/18/2002  DATE OF DISCHARGE:  07/19/2002                           DISCHARGE SUMMARY - REFERRING   DISCHARGE DIAGNOSES:  1. Coronary artery disease with presurgical left heart catheterization July 18, 2002, with finding of ejection fraction of 60% with two-vessel     disease.     a. History of coronary artery disease, status post myocardial infarction        in 1985, with percutaneous transluminal coronary angioplasty, and        status post myocardial infarction in 1993.  2. Decreased ejection fraction after a myocardial infarction and has now     recovered.  Previous Cardiolite stress study shows lateral wall     infarction.  3. Chronic sinus problems, awaiting sinus surgery.  4. Ongoing tobacco use.  5. Status post tonsillectomy in 1961.  6. Status post esophageal dilatation.  7. Family history of coronary artery disease.  8. Hyperlipidemia.  9. Hypertension.   PROCEDURE:  July 18, 2002, left heart catheterization, left ventriculogram,  coronary angiography.  This study showed the left main is free of disease.  The left anterior descending coronary artery had a mid point 70% stenosis;  however, there is a 90% lesion at just before the ostium of the first  diagonal which also has 90% stenosis.  The left circumflex is free of  disease overall, but the second obtuse marginal has a 90% proximal stenosis,  and the right coronary artery has an 80% proximal stenosis.  There is a 90%  stenosis at the ostium of the posterolateral branch.  The ejection fraction  estimated at 60%.   DISCHARGE DISPOSITION:  Mr. Ether Wolters was kept overnight for observation  at [facility] after left heart catheterization due to swelling in  the right groin which has now completely resolved.  There is no bruit over  the femoral artery and no evidence of ecchymosis or drainage.  He has no  pain there.  He is ambulating well.  His vital signs are stable.  He is  alert and oriented, comfortable.  His oxygen saturation is 95% on room air.  He is in sinus rhythm.   DISCHARGE MEDICATIONS:  1. Plavix 75 mg daily which is new.  2. Aspirin, enteric coated, 325 mg daily.  3. Lipitor 40 mg daily, preferably at bedtime.  4. Zestril 40 mg daily.  5. Zetia 10 mg daily.   ACTIVITY:  Avoid stressful activity for the next two days; that is, no  lifting or pushing, no driving, no sexual activity.  He may shower.   DIET:  Low sodium, low cholesterol.  FOLLOWUP:  He is to call [facility] at [phone] if he experiences  swelling, any increase of pain or drainage from this catheterization site.  He is to return for intervention at [facility] Wednesday, July 24, 2002, at 8:30 in the morning.  He is to have nothing to drink or eat after  midnight, July 23, 2002.  He may take his morning medications on July 24, 2002, with sips of water.   BRIEF HISTORY:  Cody Coleman is a 65 year old gentleman with a past history  of coronary artery disease.  His last left heart catheterization showed  coronary artery disease with diffuse disease in the distal vessels.  At that  time, ejection fraction was 25-30% with global hypokinesis and anterior  apical dyskinesia.  Past Cardiolite showed lateral wall infarction with some  mild ischemia.  Ejection fraction was 33%.  An echocardiogram done a year  ago in [place] showed ejection fraction had markedly improved to  50-55% with  inferior posterior hypokinesia.  He had mild left atrial enlargement.   He was seen by Dr. Daleen Squibb in the office Jun 24, 2002.  He had not been seen by  [facility] for about five years.  He had been  totally asymptomatic.  He presented for surgical clearance prior to nasal and sinus surgery.  He is  scheduled for left heart catheterization July 18, 2002.   HOSPITAL COURSE:  As described in discharge disposition, he underwent left  heart catheterization July 18, 2002, without difficulty except that he did  have a mild right groin swelling.  This has completely dissipated by the  morning of July 19, 2002, and he is ready for discharge.  Medications have  been reviewed, and he will have followup interventional catheterization in  one week on July 24, 2002.                                               Jerene Dilling, P.A.-C  [facility]    GGM/MEDQ  D:  07/19/2002  T:  07/20/2002  Job:  [id number]   cc:   Veverly Fells. Arletha Grippe, M.D.  [address]  [place]  [place] [address]  Fax: [phone]   Lucita Lora.,, M.D., [facility], [place]    cc:   Veverly Fells. Arletha Grippe, M.D.  [address]  [place]  [place] [address]  Fax: [phone]   Lucita Lora.,, M.D., [facility], [place]

## 2010-06-18 NOTE — Op Note (Signed)
NAME:  Cody Coleman, Cody Coleman                          ACCOUNT NO.:  [id number]   MEDICAL RECORD NO.:  [id number]                   PATIENT TYPE:  AMB   LOCATION:  DAY                                  FACILITY:  [facility]   PHYSICIAN:  Lionel December, M.D.                 DATE OF BIRTH:  05/01/1945   DATE OF PROCEDURE:  06/04/2002  DATE OF DISCHARGE:                                  PROCEDURE NOTE   PROCEDURE:  Esophagogastroduodenoscopy with esophageal dilatation.   INDICATION:  [name] is a 65 year old Caucasian male with a few months'  history of dysphagia which is to solids and to pills.  He did not experience  any heartburn.  He is undergoing a diagnostic and therapeutic procedure.  The procedure was reviewed with the patient and informed consent was  obtained.   PREOP MEDICATIONS:  Cetacaine spray for pharyngeal topical anesthesia,  Demerol 50 mg IV, Versed 8 mg IV in divided dose.   INSTRUMENT:  Olympus video system.   FINDINGS:  Procedure performed in endoscopy suite.  Patient's vital signs  and O2 sat were monitored during the procedure and remained stable.  Patient  was placed in the left lateral recumbent position and endoscope was passed  through the oropharynx without any difficulty into the esophagus.  Mucosa of  the esophagus normal throughout.  Squamocolumnar junction was unremarkable.  Cervical esophagus was examined on the way out and there was no Zenker's  diverticulum.  Small, sliding, hiatal hernia was noted.   Stomach:  It was empty and distended very well on insufflation.  Folds and  proximal stomach were normal.  Examination of the mucosa revealed antral  streaks of erythematous and edematous mucosa.  Pyloric channel was patent.  Angularis and fundus of the cardia were normal.   Duodenum:  Examination of the bulb and second part of the duodenum was  normal.  Endoscope was withdrawn.   Esophagus was dilated by passing 56-French Maloney dilator to full  insertion.   Endoscope was passed again and the esophagus reexamined and  there was no mucosal injury.  Endoscope was withdrawn.  The patient  tolerated the procedure well.   FINAL DIAGNOSES:  1. Small, sliding, hiatal hernia.  2. No other evidence of ring, stricture, diverticulum.  3. Esophagus dilated by passing 56-French Maloney dilator given the history     of dysphagia.  4. Nonerosive antral gastritis.  He possibly has Helicobacter pylori     infection.    RECOMMENDATIONS:  H pylori serology will be checked today.  If his dysphagia  persists, we will bring him back for barium study with a pill.  Lionel December, M.D.    NR/MEDQ  D:  06/04/2002  T:  06/04/2002  Job:  [id number]   cc:   Donzetta Sprung  [address]  [place]  [place] [address]  Fax: [phone]

## 2012-04-10 ENCOUNTER — Encounter (INDEPENDENT_AMBULATORY_CARE_PROVIDER_SITE_OTHER): Payer: Self-pay | Admitting: *Deleted

## 2012-04-12 ENCOUNTER — Ambulatory Visit (INDEPENDENT_AMBULATORY_CARE_PROVIDER_SITE_OTHER): Payer: 59 | Admitting: Internal Medicine

## 2012-04-12 ENCOUNTER — Telehealth (INDEPENDENT_AMBULATORY_CARE_PROVIDER_SITE_OTHER): Payer: Self-pay | Admitting: *Deleted

## 2012-04-12 ENCOUNTER — Other Ambulatory Visit (INDEPENDENT_AMBULATORY_CARE_PROVIDER_SITE_OTHER): Payer: Self-pay | Admitting: *Deleted

## 2012-04-12 ENCOUNTER — Encounter (INDEPENDENT_AMBULATORY_CARE_PROVIDER_SITE_OTHER): Payer: Self-pay | Admitting: Internal Medicine

## 2012-04-12 VITALS — BP 150/70 | HR 80 | Temp 98.7°F | Ht 67.0 in | Wt 194.4 lb

## 2012-04-12 DIAGNOSIS — K625 Hemorrhage of anus and rectum: Secondary | ICD-10-CM

## 2012-04-12 DIAGNOSIS — E7800 Pure hypercholesterolemia, unspecified: Secondary | ICD-10-CM | POA: Insufficient documentation

## 2012-04-12 MED ORDER — PEG 3350-KCL-NA BICARB-NACL 420 G PO SOLR: 4000.0000 mL | Freq: Once | ORAL | Status: DC

## 2012-04-12 NOTE — Progress Notes (Signed)
Subjective:     Patient ID: Cody Coleman, male   DOB: [birth year], 67 y.o.   MRN: [id number]  HPI Referred to our our office by [facility]  (Dr. Regino Schultze) for abdominal pain  and bloiod in his stools. Given Rx for Cipro and Flagyl x 10 day. Empirically treated for diverticulitis. He tells me he was having some dull lower abdominal which would come and go x 3 months. He describes as a dull ache. He also tells me he had rectal bleeding was associated with his symptoms. He has had rectal bleeding for several months. He see blood several times a week on the tissue. He has a BM every day. His stools are usually soft.  He does have frequent flatus.  He says the smell is terrible. His BMs are also malodorous. Appetite is good. No weight loss.  His last colonoscopy was in 2006 by Dr. Rehman:Small cecal polyp ablated via cold biopsy. Rest of the examination was  Normal. Biopsy:  1) polyp (Cecum, polypectomy): BENIGN FRAGMENTS OF MILDLY INFLAMED COLONIC MUCOSA. NO TUMOR     Review of Systems see hpi Current Outpatient Prescriptions  Medication Sig Dispense Refill  . ALPRAZolam (XANAX) 1 MG tablet Take 1 mg by mouth at bedtime as needed for sleep.      Marland Kitchen aspirin 325 MG tablet Take 325 mg by mouth daily.      Marland Kitchen atorvastatin (LIPITOR) 10 MG tablet Take 10 mg by mouth daily.      Marland Kitchen ezetimibe (ZETIA) 10 MG tablet Take 10 mg by mouth daily.      . tamsulosin (FLOMAX) 0.4 MG CAPS Take by mouth.      . sildenafil (VIAGRA) 100 MG tablet Take 100 mg by mouth daily as needed for erectile dysfunction.       No current facility-administered medications for this visit.   Past Medical History  Diagnosis Date  . CAD (coronary artery disease)   . MI (myocardial infarction)     x 2  . High cholesterol    Past Surgical History  Procedure Laterality Date  . Coronary artery bypass graft      2005  . Tonsillectomy    . Coronary angioplasty with stent placement     Allergies  Allergen Reactions  .  Augmentin (Amoxicillin-Pot Clavulanate)         Objective:   Physical Exam  Filed Vitals:   04/12/12 1500  BP: 150/70  Pulse: 80  Temp: 98.7 F (37.1 C)  Height: 5\' 7"  (1.702 m)  Weight: 194 lb 6.4 oz (88.179 kg)   Alert and oriented. Skin warm and dry. Oral mucosa is moist.   . Sclera anicteric, conjunctivae is pink. Thyroid not enlarged. No cervical lymphadenopathy. Lungs clear. Heart regular rate and rhythm.  Abdomen is soft. Bowel sounds are positive. No hepatomegaly. No abdominal masses felt. No tenderness.  No edema to lower extremities.  Stool brown and grossly positive. External hemorrhoids. No pain during rectal exam. No rectal mass felt.     Assessment:    Rectal bleeding. Colonic neoplasm, AVM, poly, diverticular bleed needs to be ruled out.     Plan:     Colonosocpy with Dr. Karilyn Cota.

## 2012-04-12 NOTE — Telephone Encounter (Signed)
Patient needs trilyte 

## 2012-04-12 NOTE — Patient Instructions (Signed)
Colonoscopy with Dr. Rehman 

## 2012-04-16 ENCOUNTER — Encounter (HOSPITAL_COMMUNITY): Payer: Self-pay | Admitting: Pharmacy Technician

## 2012-04-20 ENCOUNTER — Encounter (HOSPITAL_COMMUNITY): Payer: Self-pay | Admitting: *Deleted

## 2012-04-20 ENCOUNTER — Encounter (HOSPITAL_COMMUNITY)
Admission: RE | Disposition: A | Discharge status: Home / Self Care | Payer: Self-pay | Source: Ambulatory Visit | Attending: Internal Medicine

## 2012-04-20 ENCOUNTER — Ambulatory Visit (HOSPITAL_COMMUNITY)
Admission: RE | Admit: 2012-04-20 | Discharge: 2012-04-20 | Disposition: A | Discharge status: Home / Self Care | Payer: 59 | Source: Ambulatory Visit | Attending: Internal Medicine | Admitting: Internal Medicine

## 2012-04-20 DIAGNOSIS — D126 Benign neoplasm of colon, unspecified: Secondary | ICD-10-CM | POA: Insufficient documentation

## 2012-04-20 DIAGNOSIS — C20 Malignant neoplasm of rectum: Secondary | ICD-10-CM | POA: Insufficient documentation

## 2012-04-20 DIAGNOSIS — D129 Benign neoplasm of anus and anal canal: Secondary | ICD-10-CM | POA: Diagnosis not present

## 2012-04-20 DIAGNOSIS — K921 Melena: Secondary | ICD-10-CM | POA: Insufficient documentation

## 2012-04-20 DIAGNOSIS — K625 Hemorrhage of anus and rectum: Secondary | ICD-10-CM

## 2012-04-20 DIAGNOSIS — E7800 Pure hypercholesterolemia, unspecified: Secondary | ICD-10-CM | POA: Insufficient documentation

## 2012-04-20 DIAGNOSIS — D128 Benign neoplasm of rectum: Secondary | ICD-10-CM

## 2012-04-20 HISTORY — PX: COLONOSCOPY: SHX5424

## 2012-04-20 SURGERY — COLONOSCOPY
Anesthesia: Moderate Sedation

## 2012-04-20 MED ORDER — SODIUM CHLORIDE 0.9 % IV SOLN
INTRAVENOUS | Status: DC
  Administered 2012-04-20: 14:00:00 via INTRAVENOUS

## 2012-04-20 MED ORDER — MIDAZOLAM HCL 5 MG/5ML IJ SOLN
INTRAMUSCULAR | Status: AC
  Filled 2012-04-20: qty 10

## 2012-04-20 MED ORDER — MEPERIDINE HCL 50 MG/ML IJ SOLN
INTRAMUSCULAR | Status: DC | PRN
  Administered 2012-04-20 (×2): 25 mg via INTRAVENOUS

## 2012-04-20 MED ORDER — MEPERIDINE HCL 50 MG/ML IJ SOLN
INTRAMUSCULAR | Status: AC
  Filled 2012-04-20: qty 1

## 2012-04-20 MED ORDER — MIDAZOLAM HCL 5 MG/5ML IJ SOLN
INTRAMUSCULAR | Status: DC | PRN
  Administered 2012-04-20 (×5): 2 mg via INTRAVENOUS

## 2012-04-20 NOTE — Op Note (Signed)
COLONOSCOPY PROCEDURE REPORT  PATIENT:  Cody Coleman  MR#:  [id number] Birthdate:  [birth year], 67 y.o., male Endoscopist:  Dr. Malissa Hippo, MD Referred By:  Dr. Kirk Ruths, MD Procedure Date: 04/20/2012  Procedure:   Colonoscopy  Indications:  Patient is 67 year old Caucasian male who presents with recurrent hematochezia which started back in last fall. Initially sporadic no is seen small amount of blood with bowel movements daily.  Informed Consent:  The procedure and risks were reviewed with the patient and informed consent was obtained.  Medications:  Demerol 50 mg IV Versed 10 mg IV  Description of procedure:  After a digital rectal exam was performed, that colonoscope was advanced from the anus through the rectum and colon to the area of the cecum, ileocecal valve and appendiceal orifice. The cecum was deeply intubated. These structures were well-seen and photographed for the record. From the level of the cecum and ileocecal valve, the scope was slowly and cautiously withdrawn. The mucosal surfaces were carefully surveyed utilizing scope tip to flexion to facilitate fold flattening as needed. The scope was pulled down into the rectum where a thorough exam including retroflexion was performed.  Findings:   Prep excellent. Redundant colon. Small polyp of there are cold biopsy from transverse. 4 x 5 cm broad-based rectal lesion which appears partly adenomatous and partly suspicious for carcinoma. Left proximal edge of this lesion was snared. Lesion was also biopsied from the left side. This lesion was palpable on digital exam for scope was inserted into the rectum. Anorectal margin is unremarkable.    Therapeutic/Diagnostic Maneuvers Performed:  See above  Complications:  None  Cecal Withdrawal Time:  29 minutes  Impression:  Examination performed to cecum. Redundant colon. Small polyp ablated via cold biopsy from transverse colon. 4 x 5 cm broad-based rectal  lesion which appears partly adenomatous and partly suspicious for carcinoma. Distal end of this lesion was palpable on digital exam. Part of this lesion was snared and biopsy taken from different area and submitted separately.  Recommendations:  Standard instructions given. I will contact patient with biopsy results and further recommendations.  Cody Coleman,Cody Coleman  04/20/2012 3:31 PM  CC: Dr. Kirk Ruths, MD & Dr. Bonnetta Barry ref. provider found

## 2012-04-20 NOTE — H&P (Signed)
Cody Coleman is an 67 y.o. male.   Chief Complaint: Patient is here for colonoscopy. HPI: Patient is 67 year old Caucasian male who presents with intermittent hematochezia. Few weeks ago he was treated with antibiotics for presumed diverticulitis. His lower abdominal pain resolved and he is having intermittent problems daily hematochezia. His stools are now soft. He denies anorexia or weight loss. His last colonoscopy was 8 years ago. He had single small polyp removed from the cecum which was not an adenoma. Family history is negative for colorectal carcinoma.  Past Medical History  Diagnosis Date  . CAD (coronary artery disease)   . MI (myocardial infarction)     x 2  . High cholesterol     Past Surgical History  Procedure Laterality Date  . Coronary artery bypass graft      2005  . Tonsillectomy    . Coronary angioplasty with stent placement      Family History  Problem Relation Age of Onset  . Colon cancer Neg Hx    Social History:  reports that he has quit smoking. He does not have any smokeless tobacco history on file. He reports that  drinks alcohol. He reports that he does not use illicit drugs.  Allergies:  Allergies  Allergen Reactions  . Augmentin (Amoxicillin-Pot Clavulanate)     Medications Prior to Admission  Medication Sig Dispense Refill  . ALPRAZolam (XANAX) 1 MG tablet Take 1 mg by mouth at bedtime as needed for sleep.      Marland Kitchen aspirin 325 MG tablet Take 325 mg by mouth daily.      Marland Kitchen atorvastatin (LIPITOR) 10 MG tablet Take 10 mg by mouth daily.      Marland Kitchen ezetimibe (ZETIA) 10 MG tablet Take 10 mg by mouth daily.      . polyethylene glycol-electrolytes (NULYTELY/GOLYTELY) 420 G solution Take 4,000 mLs by mouth once.  4000 mL  0  . tamsulosin (FLOMAX) 0.4 MG CAPS Take 0.4 mg by mouth daily after breakfast.       . sildenafil (VIAGRA) 100 MG tablet Take 100 mg by mouth daily as needed for erectile dysfunction.        No results found for this or any previous  visit (from the past 48 hour(s)). No results found.  ROS  Blood pressure 123/83, pulse 84, temperature 98 F (36.7 C), temperature source Oral, resp. rate 21, height 5\' 7"  (1.702 m), weight 194 lb (87.998 kg), SpO2 98.00%. Physical Exam  Constitutional: He appears well-developed and well-nourished.  HENT:  Mouth/Throat: Oropharynx is clear and moist.  Eyes: Conjunctivae are normal.  Neck: No thyromegaly present.  Cardiovascular: Normal rate, regular rhythm and normal heart sounds.   No murmur heard. Respiratory: Effort normal and breath sounds normal.  GI: Soft. He exhibits no distension and no mass. There is no tenderness.  Lymphadenopathy:    He has no cervical adenopathy.     Assessment/Plan Recurrent hematochezia. Diagnostic colonoscopy.  REHMAN,NAJEEB U 04/20/2012, 2:15 PM

## 2012-04-24 ENCOUNTER — Encounter (HOSPITAL_COMMUNITY): Payer: Self-pay | Admitting: Internal Medicine

## 2012-04-24 ENCOUNTER — Telehealth (INDEPENDENT_AMBULATORY_CARE_PROVIDER_SITE_OTHER): Payer: Self-pay | Admitting: Internal Medicine

## 2012-04-24 ENCOUNTER — Telehealth (INDEPENDENT_AMBULATORY_CARE_PROVIDER_SITE_OTHER): Payer: Self-pay | Admitting: *Deleted

## 2012-04-24 ENCOUNTER — Other Ambulatory Visit (INDEPENDENT_AMBULATORY_CARE_PROVIDER_SITE_OTHER): Payer: Self-pay | Admitting: Internal Medicine

## 2012-04-24 DIAGNOSIS — R109 Unspecified abdominal pain: Secondary | ICD-10-CM

## 2012-04-24 DIAGNOSIS — Z0189 Encounter for other specified special examinations: Secondary | ICD-10-CM

## 2012-04-24 DIAGNOSIS — C20 Malignant neoplasm of rectum: Secondary | ICD-10-CM

## 2012-04-24 NOTE — Telephone Encounter (Signed)
Lab noted.

## 2012-04-24 NOTE — Telephone Encounter (Signed)
CT sch'd 04/26/12 at 145 (130), patient aware  He will need creatinine done and will go tomorrow

## 2012-04-24 NOTE — Telephone Encounter (Signed)
Lab needed prior to Diagnostic procedure

## 2012-04-24 NOTE — Telephone Encounter (Signed)
Biopsy results reviewed with patient's wife Transverse colon polyp is hyperplastic Rectal lesion is adenocarcinoma. Will proceed with chest and abdominopelvic CT with contrast. If process is localized he would also need rectal EUS before optimal treatment plan made

## 2012-04-25 ENCOUNTER — Encounter (INDEPENDENT_AMBULATORY_CARE_PROVIDER_SITE_OTHER): Payer: Self-pay | Admitting: *Deleted

## 2012-04-25 LAB — CREATININE, SERUM: Creat: 0.76 mg/dL (ref 0.50–1.35)

## 2012-04-26 ENCOUNTER — Ambulatory Visit (HOSPITAL_COMMUNITY)
Admission: RE | Admit: 2012-04-26 | Discharge: 2012-04-26 | Disposition: A | Discharge status: Home / Self Care | Payer: 59 | Source: Ambulatory Visit | Attending: Internal Medicine | Admitting: Internal Medicine

## 2012-04-26 DIAGNOSIS — R109 Unspecified abdominal pain: Secondary | ICD-10-CM

## 2012-04-26 DIAGNOSIS — C20 Malignant neoplasm of rectum: Secondary | ICD-10-CM | POA: Insufficient documentation

## 2012-04-26 DIAGNOSIS — R911 Solitary pulmonary nodule: Secondary | ICD-10-CM | POA: Insufficient documentation

## 2012-04-26 DIAGNOSIS — Z951 Presence of aortocoronary bypass graft: Secondary | ICD-10-CM | POA: Insufficient documentation

## 2012-04-26 MED ORDER — IOHEXOL 300 MG/ML  SOLN
100.0000 mL | Freq: Once | INTRAMUSCULAR | Status: AC | PRN
  Administered 2012-04-26: 100 mL via INTRAVENOUS

## 2012-04-27 ENCOUNTER — Telehealth: Payer: Self-pay | Admitting: Gastroenterology

## 2012-04-27 ENCOUNTER — Encounter: Payer: Self-pay | Admitting: Gastroenterology

## 2012-04-27 ENCOUNTER — Other Ambulatory Visit: Payer: Self-pay

## 2012-04-27 DIAGNOSIS — C20 Malignant neoplasm of rectum: Secondary | ICD-10-CM

## 2012-04-27 NOTE — Telephone Encounter (Signed)
Cody Coleman,  He needs lower EUS for newly diagnosed rectal cancer.  45 min, WL, moderate sedation, next week lunch case if there is no time on Thursday.  Thanks

## 2012-04-27 NOTE — Telephone Encounter (Signed)
Pt has been scheduled for EUS on 05/03/12 info has been mailed   Dr Karilyn Cota has been paged, pt needs to be instructed

## 2012-04-27 NOTE — Telephone Encounter (Signed)
Pt has instructed and meds reviewed he will call with any questions or concerns

## 2012-05-02 ENCOUNTER — Encounter (HOSPITAL_COMMUNITY): Payer: Self-pay | Admitting: Pharmacy Technician

## 2012-05-03 ENCOUNTER — Ambulatory Visit (HOSPITAL_COMMUNITY)
Admission: RE | Admit: 2012-05-03 | Discharge: 2012-05-03 | Disposition: A | Discharge status: Home / Self Care | Payer: 59 | Source: Ambulatory Visit | Attending: Gastroenterology | Admitting: Gastroenterology

## 2012-05-03 ENCOUNTER — Encounter (HOSPITAL_COMMUNITY): Payer: Self-pay

## 2012-05-03 ENCOUNTER — Encounter (HOSPITAL_COMMUNITY)
Admission: RE | Disposition: A | Discharge status: Home / Self Care | Payer: Self-pay | Source: Ambulatory Visit | Attending: Gastroenterology

## 2012-05-03 DIAGNOSIS — Z79899 Other long term (current) drug therapy: Secondary | ICD-10-CM | POA: Insufficient documentation

## 2012-05-03 DIAGNOSIS — Z951 Presence of aortocoronary bypass graft: Secondary | ICD-10-CM | POA: Insufficient documentation

## 2012-05-03 DIAGNOSIS — C20 Malignant neoplasm of rectum: Secondary | ICD-10-CM | POA: Diagnosis not present

## 2012-05-03 DIAGNOSIS — Z7982 Long term (current) use of aspirin: Secondary | ICD-10-CM | POA: Insufficient documentation

## 2012-05-03 DIAGNOSIS — I2510 Atherosclerotic heart disease of native coronary artery without angina pectoris: Secondary | ICD-10-CM | POA: Insufficient documentation

## 2012-05-03 DIAGNOSIS — I252 Old myocardial infarction: Secondary | ICD-10-CM | POA: Insufficient documentation

## 2012-05-03 HISTORY — PX: EUS: SHX5427

## 2012-05-03 SURGERY — ULTRASOUND, LOWER GI TRACT, ENDOSCOPIC
Anesthesia: Moderate Sedation

## 2012-05-03 MED ORDER — SPOT INK MARKER SYRINGE KIT
PACK | SUBMUCOSAL | Status: DC | PRN
  Administered 2012-05-03: 2.5 mL via SUBMUCOSAL

## 2012-05-03 MED ORDER — LACTATED RINGERS IV SOLN
INTRAVENOUS | Status: DC
  Administered 2012-05-03 (×2): via INTRAVENOUS

## 2012-05-03 MED ORDER — FENTANYL CITRATE 0.05 MG/ML IJ SOLN
INTRAMUSCULAR | Status: DC | PRN
  Administered 2012-05-03 (×4): 25 ug via INTRAVENOUS

## 2012-05-03 MED ORDER — SODIUM CHLORIDE 0.9 % IV SOLN: INTRAVENOUS | Status: DC

## 2012-05-03 MED ORDER — DIPHENHYDRAMINE HCL 50 MG/ML IJ SOLN
INTRAMUSCULAR | Status: DC | PRN
  Administered 2012-05-03: 25 mg via INTRAVENOUS

## 2012-05-03 MED ORDER — DIPHENHYDRAMINE HCL 50 MG/ML IJ SOLN
INTRAMUSCULAR | Status: AC
  Filled 2012-05-03: qty 1

## 2012-05-03 MED ORDER — MIDAZOLAM HCL 10 MG/2ML IJ SOLN
INTRAMUSCULAR | Status: AC
  Filled 2012-05-03: qty 4

## 2012-05-03 MED ORDER — FENTANYL CITRATE 0.05 MG/ML IJ SOLN
INTRAMUSCULAR | Status: AC
  Filled 2012-05-03: qty 4

## 2012-05-03 MED ORDER — SPOT INK MARKER SYRINGE KIT
PACK | SUBMUCOSAL | Status: AC
  Filled 2012-05-03: qty 5

## 2012-05-03 MED ORDER — MIDAZOLAM HCL 10 MG/2ML IJ SOLN
INTRAMUSCULAR | Status: DC | PRN
  Administered 2012-05-03 (×4): 2 mg via INTRAVENOUS

## 2012-05-03 NOTE — Op Note (Signed)
[facility] [address] [place], [address]   ENDOSCOPIC ULTRASOUND PROCEDURE REPORT  PATIENT: Cody Coleman, Cody Coleman  MR#: [id number] BIRTHDATE: 08-15-1945  GENDER: Male ENDOSCOPIST: Rachael Fee, MD REFERRED BY:  Lionel December, M.D. PROCEDURE DATE:  05/03/2012 PROCEDURE:   Lower EUS ASA CLASS:      Class II INDICATIONS:   rectal bleeding led to colonoscopy (Dr.  Karilyn Cota), this found rectal cancer and other small colon polyp.  CT scan shows no clear distant disease. MEDICATIONS: Fentanyl 100 mcg IV, Versed 8 mg IV, and Benadryl 25 mg IV  DESCRIPTION OF PROCEDURE:   After the risks benefits and alternatives of the procedure were  explained, informed consent was obtained. The patient was then placed in the left, lateral, decubitus postion and IV sedation was administered. Throughout the procedure, the patients blood pressure, pulse and oxygen saturations were monitored continuously.  Under direct visualization, the PENTAX EUS SCOPE  endoscope was introduced through the anus  and advanced to the sigmoid colon .  Water was used as necessary to provide an acoustic interface.  Upon completion of the imaging, water was removed and the patient was sent to the recovery room in satisfactory condition.    Sigmoidoscopic findings: 1. Distal rectal mass found. This is 3.5cm in diameter, heaped up, non-circumferential, friable.  Distal edge of the mass is 2cm from anal verge.  Following EUS examination, the proximal edge of the mass and one of the lateral edges were labeled with submucosal injection of [place] Ink.  EUS findings: 1. The mass above corresponds with a hypoechoic, heterogeneous lesion that is 3.5cm across, 8mm deep, passes into but not through the muscularis propria layer of the left lateral rectal wall (uT2). 2. There was a single 5mm round, hypoechoic, homogeneous, clearly bordered perirectal lymphnode that was small but still suspicious for malignant  involvement (uN1)  Impression: uT2N1 3.5cm, non-circumferential, adenocarcinoma that lays on left lateral wall of distal rectum with distal edge 2cm from anal verge. Two edges of the mass were labeled with [place] Ink following EUS examination.  He will likely benefit from neoadjuvant chemo/XRT and I will communicate this with Dr. Karilyn Cota.   _______________________________ eSigned:  Rachael Fee, MD 05/03/2012 9:11 AM

## 2012-05-03 NOTE — Interval H&P Note (Signed)
History and Physical Interval Note:  05/03/2012 8:21 AM  Cody Coleman  has presented today for surgery, with the diagnosis of Rectal cancer [154.1]  The various methods of treatment have been discussed with the patient and family. After consideration of risks, benefits and other options for treatment, the patient has consented to  Procedure(s): LOWER ENDOSCOPIC ULTRASOUND (EUS) (N/A) as a surgical intervention .  The patient's history has been reviewed, patient examined, no change in status, stable for surgery.  I have reviewed the patient's chart and labs.  Questions were answered to the patient's satisfaction.     Rob Bunting

## 2012-05-03 NOTE — H&P (View-Only) (Signed)
Cody Coleman is an 66 y.o. male.   Chief Complaint: Patient is here for colonoscopy. HPI: Patient is 66-year-old Caucasian male who presents with intermittent hematochezia. Few weeks ago he was treated with antibiotics for presumed diverticulitis. His lower abdominal pain resolved and he is having intermittent problems daily hematochezia. His stools are now soft. He denies anorexia or weight loss. His last colonoscopy was 8 years ago. He had single small polyp removed from the cecum which was not an adenoma. Family history is negative for colorectal carcinoma.  Past Medical History  Diagnosis Date  . CAD (coronary artery disease)   . MI (myocardial infarction)     x 2  . High cholesterol     Past Surgical History  Procedure Laterality Date  . Coronary artery bypass graft      2005  . Tonsillectomy    . Coronary angioplasty with stent placement      Family History  Problem Relation Age of Onset  . Colon cancer Neg Hx    Social History:  reports that he has quit smoking. He does not have any smokeless tobacco history on file. He reports that  drinks alcohol. He reports that he does not use illicit drugs.  Allergies:  Allergies  Allergen Reactions  . Augmentin (Amoxicillin-Pot Clavulanate)     Medications Prior to Admission  Medication Sig Dispense Refill  . ALPRAZolam (XANAX) 1 MG tablet Take 1 mg by mouth at bedtime as needed for sleep.      . aspirin 325 MG tablet Take 325 mg by mouth daily.      . atorvastatin (LIPITOR) 10 MG tablet Take 10 mg by mouth daily.      . ezetimibe (ZETIA) 10 MG tablet Take 10 mg by mouth daily.      . polyethylene glycol-electrolytes (NULYTELY/GOLYTELY) 420 G solution Take 4,000 mLs by mouth once.  4000 mL  0  . tamsulosin (FLOMAX) 0.4 MG CAPS Take 0.4 mg by mouth daily after breakfast.       . sildenafil (VIAGRA) 100 MG tablet Take 100 mg by mouth daily as needed for erectile dysfunction.        No results found for this or any previous  visit (from the past 48 hour(s)). No results found.  ROS  Blood pressure 123/83, pulse 84, temperature 98 F (36.7 C), temperature source Oral, resp. rate 21, height 5' 7" (1.702 m), weight 194 lb (87.998 kg), SpO2 98.00%. Physical Exam  Constitutional: He appears well-developed and well-nourished.  HENT:  Mouth/Throat: Oropharynx is clear and moist.  Eyes: Conjunctivae are normal.  Neck: No thyromegaly present.  Cardiovascular: Normal rate, regular rhythm and normal heart sounds.   No murmur heard. Respiratory: Effort normal and breath sounds normal.  GI: Soft. He exhibits no distension and no mass. There is no tenderness.  Lymphadenopathy:    He has no cervical adenopathy.     Assessment/Plan Recurrent hematochezia. Diagnostic colonoscopy.  Shields Pautz U 04/20/2012, 2:15 PM    

## 2012-05-04 ENCOUNTER — Encounter (HOSPITAL_COMMUNITY): Payer: Self-pay | Admitting: Gastroenterology

## 2012-05-11 ENCOUNTER — Encounter (HOSPITAL_COMMUNITY): Payer: Self-pay | Admitting: Oncology

## 2012-05-11 ENCOUNTER — Encounter (HOSPITAL_COMMUNITY): Payer: 59 | Attending: Oncology | Admitting: Oncology

## 2012-05-11 VITALS — BP 157/97 | HR 85 | Temp 97.9°F | Resp 18 | Ht 66.0 in | Wt 197.0 lb

## 2012-05-11 DIAGNOSIS — C20 Malignant neoplasm of rectum: Secondary | ICD-10-CM | POA: Insufficient documentation

## 2012-05-11 DIAGNOSIS — M7989 Other specified soft tissue disorders: Secondary | ICD-10-CM

## 2012-05-11 LAB — CBC WITH DIFFERENTIAL/PLATELET
Basophils Absolute: 0 10*3/uL (ref 0.0–0.1)
Eosinophils Relative: 3 % (ref 0–5)
HCT: 41.7 % (ref 39.0–52.0)
Lymphocytes Relative: 29 % (ref 12–46)
Lymphs Abs: 1.8 10*3/uL (ref 0.7–4.0)
MCV: 90.8 fL (ref 78.0–100.0)
Neutro Abs: 3.6 10*3/uL (ref 1.7–7.7)
Platelets: 195 10*3/uL (ref 150–400)
RBC: 4.59 MIL/uL (ref 4.22–5.81)
RDW: 14.7 % (ref 11.5–15.5)
WBC: 6.2 10*3/uL (ref 4.0–10.5)

## 2012-05-11 LAB — COMPREHENSIVE METABOLIC PANEL
ALT: 33 U/L (ref 0–53)
AST: 37 U/L (ref 0–37)
Alkaline Phosphatase: 64 U/L (ref 39–117)
CO2: 27 mEq/L (ref 19–32)
Chloride: 100 mEq/L (ref 96–112)
GFR calc Af Amer: >90 mL/min (ref >90)
GFR calc non Af Amer: >90 mL/min (ref >90)
Glucose, Bld: 97 mg/dL (ref 70–99)
Sodium: 140 mEq/L (ref 135–145)
Total Bilirubin: 0.9 mg/dL (ref 0.3–1.2)

## 2012-05-11 LAB — PROTIME-INR: INR: 1.04 (ref 0.00–1.49)

## 2012-05-11 NOTE — Progress Notes (Signed)
Rectal adenocarcinoma status post biopsy, colonoscopy, and rectal EUS.  Very pleasant 67 year old gentleman who works for [employer]. He started having mild abdominal pain and blood in his stools and was treated appeared that for diverticulitis in approximately January of this year. He has some dull abdominal discomfort off and on for 3 months prior to this. After lack of complete disappearance of antibiotics he was referred to Dr. Karilyn Cota. The rectal bleeding had actually increased in spite of the antibiotics. He underwent colonoscopy by Dr. Karilyn Cota on 04/20/2012 which time he was found to have a rectal mass 2 cm above the anal verge and a small polyp in the transverse colon. The rectal lesion was approximately 4 x 5 cm but felt to be 3.5 cm in diameter by Dr. Christella Hartigan and again 2 cm from the anal verge. It was friable not circumferential. One 5 mm lymph node was suspicious but hypoechoic bordering the rectum.  He is here today for consultation. There is no family history of colon cancer or other cancers in the family. Both of his parents died of congestive heart failure or cardiac disease. They're both in their 73s. He does have 1 brother.  He and his wife been married many years and a daughter in her mid 11s is in good health. He smoked for many years He stopped smoking at the time he had coronary artery bypass grafting surgery by Dr. Rexanne Mano.   His oncology review of systems otherwise is noncontributory. His weight is been stable. No fevers chills or night sweats. He does have a right knee which is painful and felt to be degenerative arthritis in nature but he has mild swelling of his right leg on physical exam which I will address later. He has had difficulty sleeping ever since he was in his early 42s and sometimes sleeps only 45 minutes to 3 hours at most.  BP 157/97  Pulse 85  Temp(Src) 97.9 F (36.6 C) (Oral)  Resp 18  Ht 5\' 6"  (1.676 m)  Wt 197 lb (89.359 kg)  BMI 31.81  kg/m2  He is in no acute distress. He is a right-handed gentleman. His lungs are clear to auscultation and percussion though he does have decreased breath sounds. His heart shows a regular rhythm and rate without murmur rub or gallop. His chest midline incision is well-healed. He has no lymphadenopathy in the cervical, supraclavicular, infraclavicular, axillary, or inguinal areas. He has no hepatosplenomegaly. Bowel sounds are normal. He has no peripheral edema the arms or the left leg the right leg has pitting 1+ edema in the mid lower leg. Pulse 1-2+ and symmetrical. Throat is clear. Several teeth are missing. The remainder-appear in good health. Pupils are equally round and reactive to light. Facial symmetry appears intact. Is no thyromegaly. He does not have gynecomastia. Skin does not sure rash.  I think this gentleman should have surgery initially for falsely aching since he may have only a T2 tumor in the lymph node may or may not be positive for tumor. He has a very low-lying rectal cancer which I suspect will need to be treated with an AP resection. I have given him the names of our surgeons here in town as well as several surgeons in [place]. He has not decided to he wants to see if this time. In the meantime we'll get some blood work and a PET scan to make sure he does not have a distant disease and to see if we can see a  positivity in the lymph nodes around the rectum.  I personally doubt that a 5 mm lymph node will show up on a PET scan if that is all he has.  He does not have lymph node involvement or more extensive local involvement he may not need radiation therapy let alone chemotherapy therefore I think full staging to surgery would be more appropriate.  He is agreeable to this plan I will discuss his case with Dr. Karilyn Cota on Monday. He will think about where he wants to be seen from a surgical standpoint.

## 2012-05-11 NOTE — Progress Notes (Signed)
Cody Coleman presented for labwork. Labs per MD order drawn via Peripheral Line 23 gauge needle inserted in right AC  Good blood return present. Procedure without incident.  Needle removed intact. Patient tolerated procedure well.

## 2012-05-11 NOTE — Patient Instructions (Addendum)
[facility] Discharge Instructions  RECOMMENDATIONS MADE BY THE CONSULTANT AND ANY TEST RESULTS WILL BE SENT TO YOUR REFERRING PHYSICIAN.  EXAM FINDINGS BY THE PHYSICIAN TODAY AND SIGNS OR SYMPTOMS TO REPORT TO CLINIC OR PRIMARY PHYSICIAN: Exam and discussion by MD.  Will do ultrasound of your right leg on Monday and PET scan on Tuesday.  MEDICATIONS PRESCRIBED:  none  INSTRUCTIONS GIVEN AND DISCUSSED: PET scan to be done at [facility] on 4/15 at 12 noon.  Arrive in radiology department at 11:45am.  Nothing to eat or drink 6 hours prior to scan especially nothing with sugar, no gum,mints or hard candy.  SPECIAL INSTRUCTIONS/FOLLOW-UP: Follow-up with [place] in 2 weeks.  Thank you for choosing [facility] to provide your oncology and hematology care.  To afford each patient quality time with our providers, please arrive at least 15 minutes before your scheduled appointment time.  With your help, our goal is to use those 15 minutes to complete the necessary work-up to ensure our physicians have the information they need to help with your evaluation and healthcare recommendations.    Effective January 1st, 2014, we ask that you re-schedule your appointment with our physicians should you arrive 10 or more minutes late for your appointment.  We strive to give you quality time with our providers, and arriving late affects you and other patients whose appointments are after yours.    Again, thank you for choosing [facility].  Our hope is that these requests will decrease the amount of time that you wait before being seen by our physicians.       _____________________________________________________________  Should you have questions after your visit to [facility], please contact our office at [phone] between the hours of 8:30 a.m. and 5:00 p.m.  Voicemails left after 4:30 p.m. will not be returned until the following business  day.  For prescription refill requests, have your pharmacy contact our office with your prescription refill request.

## 2012-05-12 LAB — IRON AND TIBC
Iron: 136 ug/dL — ABNORMAL HIGH (ref 42–135)
Saturation Ratios: 33 % (ref 20–55)
TIBC: 409 ug/dL (ref 215–435)
UIBC: 273 ug/dL (ref 125–400)

## 2012-05-14 ENCOUNTER — Ambulatory Visit (HOSPITAL_COMMUNITY)
Admission: RE | Admit: 2012-05-14 | Discharge: 2012-05-14 | Disposition: A | Discharge status: Home / Self Care | Payer: 59 | Source: Ambulatory Visit | Attending: Oncology | Admitting: Oncology

## 2012-05-14 DIAGNOSIS — M7120 Synovial cyst of popliteal space [Baker], unspecified knee: Secondary | ICD-10-CM | POA: Insufficient documentation

## 2012-05-14 DIAGNOSIS — M7989 Other specified soft tissue disorders: Secondary | ICD-10-CM | POA: Diagnosis not present

## 2012-05-14 DIAGNOSIS — C20 Malignant neoplasm of rectum: Secondary | ICD-10-CM

## 2012-05-15 ENCOUNTER — Other Ambulatory Visit (HOSPITAL_COMMUNITY): Payer: Self-pay | Admitting: Oncology

## 2012-05-15 ENCOUNTER — Telehealth (INDEPENDENT_AMBULATORY_CARE_PROVIDER_SITE_OTHER): Payer: Self-pay

## 2012-05-15 ENCOUNTER — Encounter (HOSPITAL_COMMUNITY)
Admission: RE | Admit: 2012-05-15 | Discharge: 2012-05-15 | Disposition: A | Discharge status: Home / Self Care | Payer: 59 | Source: Ambulatory Visit | Attending: Oncology | Admitting: Oncology

## 2012-05-15 ENCOUNTER — Encounter (INDEPENDENT_AMBULATORY_CARE_PROVIDER_SITE_OTHER): Payer: Self-pay | Admitting: General Surgery

## 2012-05-15 DIAGNOSIS — C20 Malignant neoplasm of rectum: Secondary | ICD-10-CM | POA: Diagnosis not present

## 2012-05-15 DIAGNOSIS — E876 Hypokalemia: Secondary | ICD-10-CM

## 2012-05-15 DIAGNOSIS — M7989 Other specified soft tissue disorders: Secondary | ICD-10-CM

## 2012-05-15 LAB — GLUCOSE, CAPILLARY: Glucose-Capillary: 107 mg/dL — ABNORMAL HIGH (ref 70–99)

## 2012-05-15 MED ORDER — FLUDEOXYGLUCOSE F - 18 (FDG) INJECTION
19.7000 | Freq: Once | INTRAVENOUS | Status: AC | PRN
  Administered 2012-05-15: 19.7 via INTRAVENOUS

## 2012-05-15 MED ORDER — POTASSIUM CHLORIDE CRYS ER 20 MEQ PO TBCR: 20.0000 meq | EXTENDED_RELEASE_TABLET | Freq: Two times a day (BID) | ORAL | Status: DC

## 2012-05-15 NOTE — Telephone Encounter (Signed)
I called to schedule the pt for an appointment to see Dr Maisie Fus after she spoke to Dr Abbey Chatters.  Dr Mariel Sleet referred.  The pt states he is confused because he was expecting to see Dr Abbey Chatters and Dr Maisie Fus is not on the list he was given.   I told him he can talk to Dr Mariel Sleet 1st if he felt comfortable to be sure he is ok with that referral to her.  The pt will call me back.

## 2012-05-15 NOTE — Progress Notes (Signed)
Patient ID: Cody Coleman, male   DOB: [birth year], 67 y.o.   MRN: [id number] I spoke with him about his situation and how I felt Dr Maisie Fus was the most qualified person in our practice to do anal/sphincter sparing surgery if it would be possible.  He will call back tomorrow and set up an appointment for consultation with her.

## 2012-05-16 ENCOUNTER — Other Ambulatory Visit (HOSPITAL_COMMUNITY): Payer: Self-pay

## 2012-05-23 ENCOUNTER — Ambulatory Visit (INDEPENDENT_AMBULATORY_CARE_PROVIDER_SITE_OTHER): Payer: 59 | Admitting: General Surgery

## 2012-05-23 ENCOUNTER — Encounter (HOSPITAL_BASED_OUTPATIENT_CLINIC_OR_DEPARTMENT_OTHER): Payer: 59 | Admitting: Oncology

## 2012-05-23 ENCOUNTER — Encounter (INDEPENDENT_AMBULATORY_CARE_PROVIDER_SITE_OTHER): Payer: Self-pay | Admitting: General Surgery

## 2012-05-23 VITALS — BP 113/77 | HR 92 | Temp 97.5°F | Resp 16

## 2012-05-23 VITALS — BP 136/84 | HR 68 | Temp 97.8°F | Resp 16 | Ht 67.0 in | Wt 198.8 lb

## 2012-05-23 DIAGNOSIS — C20 Malignant neoplasm of rectum: Secondary | ICD-10-CM

## 2012-05-23 NOTE — Patient Instructions (Signed)
[facility] Discharge Instructions  RECOMMENDATIONS MADE BY THE CONSULTANT AND ANY TEST RESULTS WILL BE SENT TO YOUR REFERRING PHYSICIAN.  EXAM FINDINGS BY THE PHYSICIAN TODAY AND SIGNS OR SYMPTOMS TO REPORT TO CLINIC OR PRIMARY PHYSICIAN: Exam and findings as discussed by Dr. Mariel Sleet.  SPECIAL INSTRUCTIONS/FOLLOW-UP: 1.  Follow-up with your surgeon as planned. 2.  Return to our clinic in 4-5 weeks as planned.  Contact us sooner for concerns or questions.  Thank you for choosing [facility] to provide your oncology and hematology care.  To afford each patient quality time with our providers, please arrive at least 15 minutes before your scheduled appointment time.  With your help, our goal is to use those 15 minutes to complete the necessary work-up to ensure our physicians have the information they need to help with your evaluation and healthcare recommendations.    Effective January 1st, 2014, we ask that you re-schedule your appointment with our physicians should you arrive 10 or more minutes late for your appointment.  We strive to give you quality time with our providers, and arriving late affects you and other patients whose appointments are after yours.    Again, thank you for choosing [facility].  Our hope is that these requests will decrease the amount of time that you wait before being seen by our physicians.       _____________________________________________________________  Should you have questions after your visit to [facility], please contact our office at [phone] between the hours of 8:30 a.m. and 5:00 p.m.  Voicemails left after 4:30 p.m. will not be returned until the following business day.  For prescription refill requests, have your pharmacy contact our office with your prescription refill request.

## 2012-05-23 NOTE — Progress Notes (Signed)
No chief complaint on file.   HISTORY: Cody Coleman is a 67 y.o. male who presents to the office with a distal rectal cancer.  This was found on colonoscopy.  Workup thus far has included an [place] (uT3N1).  A CT Chest, Abd and Pelvis were negative for any distal metastatic disease.  He denies any abd pain.  He has occasional rectal bleeding.  He has had some diarrhea recently.  He is currently changing job requirements and planning on retiring in the next few years.  Past Medical History  Diagnosis Date  . CAD (coronary artery disease)   . MI (myocardial infarction)     x 2  . High cholesterol       Past Surgical History  Procedure Laterality Date  . Coronary artery bypass graft      2005  . Tonsillectomy    . Coronary angioplasty with stent placement    . Colonoscopy N/A 04/20/2012    Procedure: COLONOSCOPY;  Surgeon: Malissa Hippo, MD;  Location[facility];  Service: Endoscopy;  Laterality: N/A;  225  . Eus N/A 05/03/2012    Procedure: LOWER ENDOSCOPIC ULTRASOUND (EUS);  Surgeon: Rachael Fee, MD;  Location: [facility];  Service: Endoscopy;  Laterality: N/A;      Current Outpatient Prescriptions  Medication Sig Dispense Refill  . ALPRAZolam (XANAX) 1 MG tablet Take 1 mg by mouth at bedtime as needed for sleep.      Marland Kitchen aspirin 325 MG tablet Take 325 mg by mouth daily.      Marland Kitchen atorvastatin (LIPITOR) 10 MG tablet Take 10 mg by mouth daily before breakfast.       . ezetimibe (ZETIA) 10 MG tablet Take 10 mg by mouth daily before breakfast.       . potassium chloride SA (K-DUR,KLOR-CON) 20 MEQ tablet Take 1 tablet (20 mEq total) by mouth 2 (two) times daily.  30 tablet  1  . sildenafil (VIAGRA) 100 MG tablet Take 100 mg by mouth daily as needed for erectile dysfunction.      . tamsulosin (FLOMAX) 0.4 MG CAPS Take 0.4 mg by mouth daily after breakfast.        No current facility-administered medications for this visit.      Allergies  Allergen Reactions  . Augmentin  (Amoxicillin-Pot Clavulanate) Swelling      Family History  Problem Relation Age of Onset  . Colon cancer Neg Hx   . Congestive Heart Failure Mother   . Stroke Father   . Coronary artery disease Brother       History   Social History  . Marital Status: Married    Spouse Name: N/A    Number of Children: N/A  . Years of Education: N/A   Social History Main Topics  . Smoking status: Former Games developer  . Smokeless tobacco: None     Comment: quit 86yrs ago  . Alcohol Use: 15.0 oz/week    25 Shots of liquor per week     Comment: drinks 2-3 drinks on occasion  . Drug Use: No  . Sexually Active: None   Other Topics Concern  . None   Social History Narrative  . None       REVIEW OF SYSTEMS - PERTINENT POSITIVES ONLY: Review of Systems - General ROS: negative for - chills or fever Hematological and Lymphatic ROS: negative for - bleeding problems, blood clots or jaundice Respiratory ROS: no cough, shortness of breath, or wheezing Cardiovascular ROS: no chest  pain or dyspnea on exertion Gastrointestinal ROS: positive for - blood in stools, change in bowel habits and diarrhea negative for - abdominal pain, appetite loss, gas/bloating or nausea/vomiting Genito-Urinary ROS: no dysuria, trouble voiding, or hematuria  EXAM: Filed Vitals:   05/23/12 0926  BP: 136/84  Pulse: 68  Temp: 97.8 F (36.6 C)  Resp: 16    Gen:  No acute distress.  Well nourished and well groomed.   Neurological: Alert and oriented to person, place, and time. Coordination normal.  Head: Normocephalic and atraumatic.  Eyes: Conjunctivae are normal. Pupils are equal, round, and reactive to light. No scleral icterus.  Neck: Normal range of motion. Neck supple. No tracheal deviation or thyromegaly present.  No cervical lymphadenopathy. Cardiovascular: Normal rate, regular rhythm, normal heart sounds and intact distal pulses.  Exam reveals no gallop and no friction rub.  No murmur heard. Respiratory: Effort  normal.  No respiratory distress. No chest wall tenderness. Breath sounds normal.  No wheezes, rales or rhonchi.  GI: Soft. Bowel sounds are normal. The abdomen is soft and nontender.  There is no rebound and no guarding.  Musculoskeletal: Normal range of motion. Extremities are nontender.  Skin: Skin is warm and dry. No rash noted. No diaphoresis. No erythema. No pallor. No clubbing, cyanosis, or edema.   Psychiatric: Normal mood and affect. Behavior is normal. Judgment and thought content normal.   Anorectal Exam: mass palpated 2-3 cm from anal verge, mobile, ~1-2cm above sphincter, R lateral sidewall   LABORATORY RESULTS: Available labs are reviewed  CEA: 2.9  RADIOLOGY RESULTS:   Images and reports are reviewed. EUS: T2N1 CT CHEST IMPRESSION:  1. No acute cardiopulmonary abnormalities. No specific features to suggest metastatic disease.  2. Subpleural nodule along the minor fissure of the right lung measures 3 mm. CT ABD and Pelvis IMPRESSION:  1. No evidence for metastatic disease.  2. Nodularity along the left lateral wall of the rectum is noted. This may correspond to the colonoscopy findings of well differentiated adenocarcinoma.  3. Small perirectal lymph nodes are nonspecific. PET IMPRESSION:  1. Abnormal soft tissue thickening along the left lateral wall of the rectum with associated increased FDG uptake consistent with primary rectal carcinoma.  2. No evidence for hypermetabolic metastases.     ASSESSMENT AND PLAN: Cody Coleman is a 67 y.o. M who presents to the office with a uT2N1 rectal cancer.  He has no signs of distal metastatic disease.  We discussed his options.  Either we can perform an APR and evaluate his pathology afterwards for need for chemotherapy or we can proceed with neoadjuvant therapy and then re-evaluate for a sphincter sparing procedure.  We discussed the risks and benefits of each operation.  We talked about wound healing concerns and hernias with  the APR and urgency and leakage issues with a coloanal anastomosis.  We discussed that either option carries a risk of pelvic nerve damage and damage to ureters.  We discussed that he would have a temporary ostomy with the coloanal anastomosis as well.  He is not sure which option he would like.  He will call the office in the next few days to let us know his decision.  If we go ahead with APR, he will need preop cardiac clearance.    The surgery and anatomy were described to the patient as well as the risks of surgery and the possible complications.  These include: Bleeding, infection and possible wound complications such as hernia, damage to adjacent  structures, leak of surgical connections, which can lead to other surgeries and possibly an ostomy (5-7%), possible need for other procedures, such as abscess drains in radiology, possible prolonged hospital stay, possible diarrhea from removal of part of the colon, possible constipation from narcotics, prolonged fatigue/weakness or appetite loss, possible early recurrence of cancer, possible complications of their medical problems such as heart disease or arrhythmias or lung problems, death (less than 1%). I believe the patient understands and wishes to proceed with the surgery.     Vanita Panda, MD Colon and Rectal Surgery / General Surgery [facility]      Visit Diagnoses: 1. Rectal cancer     Primary Care Physician: Kirk Ruths, MD

## 2012-05-23 NOTE — Progress Notes (Signed)
The patient has a rectal cancer with a possible 5 mm adjacent lymph node that is suspicious for involvement by ultrasound. Scan however does not reveal distant metastatic disease nor reveal positive lymphadenopathy.  He and his wife are here today to discuss his consultation with Dr. Maisie Fus concerning his various surgical options.  After a long counseling session I think 30 minutes he is leaning towards APR surgery. Dr. Karilyn Cota and I both believe that is the appropriate have to follow since he has a fairly small lesion (T2) and may or may not have positive adenopathy. If he does not warrant adjuvant chemotherapy or radiation therapy that would be important to know at this point in time. The patient and his wife understand that clearly stage in a patient is important in determining final recommendations about chemotherapy or radiation therapy. Because of the side effects of the above therapy if he does not need them, then we should avoid them.  He was very happy with our discussion and he will be in contact with Dr. Maisie Fus about his final decision.  He may need cardiac clearance and he states that Dr. Maisie Fus will arrange that prior surgery.

## 2012-05-23 NOTE — Patient Instructions (Signed)
Colorectal Cancer  Colorectal cancer is the second most common cancer in the [place], striking 140,000 people annually and causing 60,000 deaths. That's a staggering figure when you consider the disease is potentially curable if diagnosed in the early stages. Who is at risk? Though colorectal cancer may occur at any age, more than 90% of the patients are over age 66, at which point the risk doubles every ten years. In addition to age, other high risk factors include a family history of colorectal cancer and polyps and a personal history of ulcerative colitis, colon polyps or cancer of other organs, especially of the breast or uterus. How does it start? It is generally agreed that nearly all colon and rectal cancer begins in benign polyps. These pre-malignant growths occur on the bowel wall and may eventually increase in size and become cancer. Removal of benign polyps is one aspect of preventive medicine that really works! What are the symptoms? The most common symptoms are rectal bleeding and changes in bowel habits, such as constipation or diarrhea. (These symptoms are also common in other diseases so it is important you receive a thorough examination should you experience them.) Abdominal pain and weight loss are usually late symptoms indicating possible extensive disease. Unfortunately, many polyps and early cancers fail to produce symptoms. Therefore, it is important that your routine physical includes colorectal cancer detection procedures once you reach age 50.  There are several methods for detection of colorectal cancer. These include digital rectal examination, a chemical test of the stool for blood, flexible sigmoidoscopy and colonoscopy (lighted tubular instruments used to inspect the lower bowel) and barium enema. Be sure to discuss these options with your surgeon to determine which procedure is best for you. Individuals who have a first-degree relative (parent or sibling) with colon  cancer or polyps should start their colon cancer screening at the age of 66. How is colorectal cancer treated? Colorectal cancer requires surgery in nearly all cases for complete cure. Radiation and chemotherapy are sometimes used in addition to surgery. Between 80-90% are restored to normal health if the cancer is detected and treated in the earliest stages. The cure rate drops to 50% or less when diagnosed in the later stages. Thanks to modern technology, less than 5% of all colorectal cancer patients require a colostomy, the surgical construction of an artificial excretory opening from the colon. Can colon cancer be prevented? Colon cancer is very preventable. The most important step towards preventing colon cancer is getting a screening test. Any abnormal screening test should be followed by a colonoscopy. Some individuals prefer to start with colonoscopy as a screening test. Colonoscopy provides a detailed examination of the bowel. Polyps can be identified and can often be removed during colonoscopy. Though not definitely proven, there is some evidence that diet may play a significant role in preventing colorectal cancer. As far as we know, a high fiber, low fat diet is the only dietary measure that might help prevent colorectal cancer. Finally, pay attention to changes in your bowel habits. Any new changes such as persistent constipation, diarrhea, or blood in the stool should be discussed with your physician.   Can hemorrhoids lead to colon cancer? No, but hemorrhoids may produce symptoms similar to colon polyps or cancer. Should you experience these symptoms, you should have them examined and evaluated by a physician, preferably by a colon and rectal surgeon. What is a colon and rectal surgeon? Colon and rectal surgeons are experts in the surgical and non-surgical treatment   of diseases of the colon, rectum and anus. They have completed advanced surgical training in the treatment of these  diseases as well as full general surgical training. Board-certified colon and rectal surgeons complete residencies in general surgery and colon and rectal surgery, and pass intensive examinations conducted by the American Board of Surgery and the American Board of Colon and Rectal Surgery. They are well-versed in the treatment of both benign and malignant diseases of the colon, rectum and anus and are able to perform routine screening examinations and surgically treat conditions if indicated to do so.  2012 American Society of Colon & Rectal Surgeons  

## 2012-05-29 ENCOUNTER — Ambulatory Visit (HOSPITAL_COMMUNITY): Payer: 59 | Admitting: Oncology

## 2012-05-29 ENCOUNTER — Other Ambulatory Visit (INDEPENDENT_AMBULATORY_CARE_PROVIDER_SITE_OTHER): Payer: Self-pay | Admitting: General Surgery

## 2012-05-29 ENCOUNTER — Ambulatory Visit (INDEPENDENT_AMBULATORY_CARE_PROVIDER_SITE_OTHER): Payer: 59 | Admitting: Cardiology

## 2012-05-29 ENCOUNTER — Encounter: Payer: Self-pay | Admitting: Cardiology

## 2012-05-29 VITALS — BP 132/88 | HR 82 | Ht 67.0 in | Wt 198.0 lb

## 2012-05-29 DIAGNOSIS — C20 Malignant neoplasm of rectum: Secondary | ICD-10-CM

## 2012-05-29 DIAGNOSIS — I25810 Atherosclerosis of coronary artery bypass graft(s) without angina pectoris: Secondary | ICD-10-CM | POA: Diagnosis not present

## 2012-05-29 MED ORDER — ATORVASTATIN CALCIUM 20 MG PO TABS: 20.0000 mg | ORAL_TABLET | Freq: Every day | ORAL | Status: DC

## 2012-05-29 MED ORDER — METOPROLOL TARTRATE 25 MG PO TABS: 25.0000 mg | ORAL_TABLET | Freq: Two times a day (BID) | ORAL | Status: DC

## 2012-05-29 NOTE — Addendum Note (Signed)
Addended by: Micki Riley C on: 05/29/2012 11:25 AM   Modules accepted: Orders

## 2012-05-29 NOTE — Patient Instructions (Addendum)
Start metoprolol tartrate (lopressor) 25mg  two times a day.  Increase atorvastatin (lipitor) to 20mg  daily. You can take 2 of your 10 mg tablets at the same time and use your current supply.  Your physician recommends that you return for a FASTING lipid profile /liver profile in 2 months. This is scheduled for Thursday June 26,2014. Do not eat or drink after midnight the night before. The lab opens at 7:30AM.   Your physician wants you to follow-up in: 1 year with Dr Shirlee Latch. (April 2015).  You will receive a reminder letter in the mail two months in advance. If you don't receive a letter, please call our office to schedule the follow-up appointment.

## 2012-05-29 NOTE — Progress Notes (Signed)
Patient ID: Cody Coleman, male   DOB: [birth year], 67 y.o.   MRN: [id number] PCP: Dr. Regino Schultze  67 yo with history of CAD s/p CABG presents for cardiology evaluation prior to rectal cancer surgery (needs APR).  He had CABG x 6 in 2005.  Stress tests later in 2005 and in 2008 were normal.  He has not seen a cardiologist since 2008.  In general, he has done well from a cardiopulmonary standpoint.  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  He has had no chest pain or exertional dyspnea.  He has never used NTG.  He was diagnosed this year with rectal cancer and will need APR surgery.  He used to take atorvastatin 40 daily, now only taking 10 mg daily.  Apparently he had some problems with his LFTs.  However, he had been taking atorvastatin 40 mg daily for years with no problems.  He says that Dr. Regino Schultze checked his LFTs recently and they were back to normal.    ECG: NSR, normal  Labs (3/14): LDL 83, HDL 64, TGs 203 Labs (4/14): K 3.4, creatinine 0.77  PMH: 1. Rectal cancer, needs APR surgery.  2. CAD: CABG in 2005 with LIMA-LAD, SVG-D, seq SVG-OM1/dLCx, seq SVG-PDA/PLV.  Stress myoviews in 11/05 and in 2008 were normal. 3. Hyperlipidemia  SH: Lives in [place] with wife, quit smoking 2004, continues moderate ETOH intake, works for [employer] and [employer]).    FH: CAD (brother), CVA (father), CHF (mother).   ROS: All systems were reviewed and negative except as per HPI.   Current Outpatient Prescriptions  Medication Sig Dispense Refill  . ALPRAZolam (XANAX) 1 MG tablet Take 1 mg by mouth 3 (three) times daily as needed for sleep.       Marland Kitchen aspirin 325 MG tablet Take 325 mg by mouth daily.      Marland Kitchen ezetimibe (ZETIA) 10 MG tablet Take 10 mg by mouth daily before breakfast.       . potassium chloride SA (K-DUR,KLOR-CON) 20 MEQ tablet Take 1 tablet (20 mEq total) by mouth 2 (two) times daily.  30 tablet   1  . sildenafil (VIAGRA) 100 MG tablet Take 100 mg by mouth daily as needed for erectile dysfunction.      . tamsulosin (FLOMAX) 0.4 MG CAPS Take 0.4 mg by mouth daily after breakfast.       . atorvastatin (LIPITOR) 20 MG tablet Take 1 tablet (20 mg total) by mouth daily.  30 tablet  3  . metoprolol tartrate (LOPRESSOR) 25 MG tablet Take 1 tablet (25 mg total) by mouth 2 (two) times daily.  60 tablet  11   No current facility-administered medications for this visit.    BP 132/88  Pulse 82  Ht 5\' 7"  (1.702 m)  Wt 198 lb (89.812 kg)  BMI 31 kg/m2 General: NAD Neck: No JVD, no thyromegaly or thyroid nodule.  Lungs: Clear to auscultation bilaterally with normal respiratory effort. CV: Nondisplaced PMI.  Heart regular S1/S2, +S4, no murmur.  No peripheral edema.  No carotid bruit.  Normal pedal pulses.  Abdomen: Soft, nontender, no hepatosplenomegaly, no distention.  Skin: Intact without lesions or rashes.  Neurologic: Alert and oriented x 3.  Psych: Normal affect. Extremities: No clubbing or cyanosis.  HEENT: Normal.   Assessment/Plan: 1. CAD: No ischemic symptoms.  He has done quite well since his CABG.  ECG is  normal.  Last myoview in 2008 was normal.  I do not think that there is an indication for a functional study at this point given lack of symptoms.  I will have him continue ASA 81 and statin.  I will increase his atorvastatin back to 20 mg daily with lipids/LFTs in 2 months.   2. Preoperative evaluation: Patient is stable to go for surgery from a cardiac perspective without further testing.  Given his CAD history, I will start him on metoprolol 25 mg bid pre-operatively.   3. If all goes well with surgery, I would like to see him back in the office in 1 year unless intercurrent symptoms develop.   Marca Ancona 05/29/2012 11:21 AM

## 2012-05-31 ENCOUNTER — Encounter (HOSPITAL_COMMUNITY): Payer: Self-pay | Admitting: Pharmacy Technician

## 2012-06-01 ENCOUNTER — Encounter (HOSPITAL_COMMUNITY)
Admission: RE | Admit: 2012-06-01 | Discharge: 2012-06-01 | Disposition: A | Discharge status: Home / Self Care | Payer: 59 | Source: Ambulatory Visit | Attending: General Surgery | Admitting: General Surgery

## 2012-06-01 ENCOUNTER — Encounter (HOSPITAL_COMMUNITY): Payer: Self-pay

## 2012-06-01 ENCOUNTER — Encounter (INDEPENDENT_AMBULATORY_CARE_PROVIDER_SITE_OTHER): Payer: Self-pay

## 2012-06-01 DIAGNOSIS — C20 Malignant neoplasm of rectum: Secondary | ICD-10-CM | POA: Insufficient documentation

## 2012-06-01 DIAGNOSIS — Z01812 Encounter for preprocedural laboratory examination: Secondary | ICD-10-CM | POA: Insufficient documentation

## 2012-06-01 HISTORY — DX: Sleep disorder, unspecified: G47.9

## 2012-06-01 HISTORY — DX: Malignant (primary) neoplasm, unspecified: C80.1

## 2012-06-01 HISTORY — DX: Pain, unspecified: R52

## 2012-06-01 HISTORY — DX: Overactive bladder: N32.81

## 2012-06-01 LAB — CBC
Hemoglobin: 14.5 g/dL (ref 13.0–17.0)
Platelets: 150 10*3/uL (ref 150–400)
RBC: 4.62 MIL/uL (ref 4.22–5.81)
WBC: 7.6 10*3/uL (ref 4.0–10.5)

## 2012-06-01 LAB — BASIC METABOLIC PANEL
CO2: 27 mEq/L (ref 19–32)
Chloride: 102 mEq/L (ref 96–112)
Glucose, Bld: 104 mg/dL — ABNORMAL HIGH (ref 70–99)
Potassium: 4.5 mEq/L (ref 3.5–5.1)
Sodium: 138 mEq/L (ref 135–145)

## 2012-06-01 LAB — SURGICAL PCR SCREEN
MRSA, PCR: NEGATIVE
Staphylococcus aureus: NEGATIVE

## 2012-06-01 NOTE — Patient Instructions (Signed)
YOUR SURGERY IS SCHEDULED AT [facility]  ON:  Monday  5/12  REPORT TO  SHORT STAY CENTER AT:  5:30 AM      PHONE # FOR SHORT STAY IS [phone]    DO NOT EAT OR DRINK ANYTHING AFTER MIDNIGHT THE NIGHT BEFORE YOUR SURGERY.  YOU MAY BRUSH YOUR TEETH, RINSE OUT YOUR MOUTH--BUT NO WATER, NO FOOD, NO CHEWING GUM, NO MINTS, NO CANDIES, NO CHEWING TOBACCO.  PLEASE TAKE THE FOLLOWING MEDICATIONS THE AM OF YOUR SURGERY WITH A FEW SIPS OF WATER:  METOPROLOL, FLOMAX, LIPITOR.  MAY TAKE XANAX IF NEEDED FOR ANXIETY      DO NOT BRING VALUABLES, MONEY, CREDIT CARDS.  DO NOT WEAR JEWELRY, MAKE-UP, NAIL POLISH AND NO METAL PINS OR CLIPS IN YOUR HAIR. CONTACT LENS, DENTURES / PARTIALS, GLASSES SHOULD NOT BE WORN TO SURGERY AND IN MOST CASES-HEARING AIDS WILL NEED TO BE REMOVED.  BRING YOUR GLASSES CASE, ANY EQUIPMENT NEEDED FOR YOUR CONTACT LENS. FOR PATIENTS ADMITTED TO THE HOSPITAL--CHECK OUT TIME THE DAY OF DISCHARGE IS 11:00 AM.  ALL INPATIENT ROOMS ARE PRIVATE - WITH BATHROOM, TELEPHONE, TELEVISION AND WIFI INTERNET.                              PLEASE READ OVER ANY  FACT SHEETS THAT YOU WERE GIVEN: MRSA INFORMATION, BLOOD TRANSFUSION INFORMATION FAILURE TO FOLLOW THESE INSTRUCTIONS MAY RESULT IN THE CANCELLATION OF YOUR SURGERY.   PATIENT SIGNATURE_________________________________

## 2012-06-01 NOTE — Pre-Procedure Instructions (Signed)
CHEST CT REPORT IN EPIC FROM 04/26/12. EKG REPORT AND CARDIOLOGY OFFICE NOTE WITH CLEARANCE FOR COLON SURGERY - IN EPIC FROM DR. MCLEAN. Cody Coleman OSTOMY NURSE CONSULTED WITH PATIENT TODAY.

## 2012-06-01 NOTE — Consult Note (Signed)
WOC ostomy consult  Preoperative stoma site selection and marking per Dr. Maisie Fus' request.   Patient is scheduled for a laparoscopic abdominal-peritoneal resection (APR) on Monday, May12th.  A site is selected in the LLQ after observing patient's abdominal contours in the sitting and standing positions.   Site selected is within the abdominal rectus muscle, 3cm below the unbilicus and 6cm to the left.  Loraine Leriche is made with a skin marking pen and covered with a thin film transparent dressing.  Patient and wife (an Charity fundraiser) are provided with the skin marking pen and are instructed to reinforce mark as needed between today and the surgery date. Patient states that he has gained approximately 15 lbs. Since his diagnosis; he is eating nervously.  Beltline, waistband of underwear and lifestyle were all considered in placement of today's mark.  Education provided: Patient and wife instructed in pouch characteristics.  Demonstration provided of sample pouches and Lock N' Roll closure demonstrated.  A consumer website provided (www.[email or web address].com)  in addition to written materials and a CD for supplemental information prior to surgery if he is so inclined.  Patient instructed that this is not a requirement and that additional and comprehensive education will take place in the hospital and in the immediate post-acute phase. Note:  Patient and wife are interested in colostomy irrigation as a possible management strategy following recovery from surgery as he is a very active outdoors man (fishing is his favorite pastime and hobby) and he is concerned that pouch emptying may not be convenient. Additionally, patient is interested in shaving prior to pouch applications due to presence and tendency for hair growth in the parastomal field. I look forward to working with this pleasant gentleman and his wife. The [facility] Nursing Team will follow along with you.   Thanks, Ladona Mow, MSN, RN, [facility], CWOCN [phone])

## 2012-06-11 ENCOUNTER — Inpatient Hospital Stay (HOSPITAL_COMMUNITY)
Admission: RE | Admit: 2012-06-11 | Discharge: 2012-06-15 | DRG: 334 | Disposition: A | Discharge status: Home / Self Care | Payer: 59 | Source: Ambulatory Visit | Attending: General Surgery | Admitting: General Surgery

## 2012-06-11 ENCOUNTER — Encounter (HOSPITAL_COMMUNITY)
Admission: RE | Disposition: A | Discharge status: Home / Self Care | Payer: Self-pay | Source: Ambulatory Visit | Attending: General Surgery

## 2012-06-11 ENCOUNTER — Encounter (HOSPITAL_COMMUNITY): Payer: Self-pay | Admitting: *Deleted

## 2012-06-11 ENCOUNTER — Inpatient Hospital Stay (HOSPITAL_COMMUNITY): Payer: 59 | Admitting: Anesthesiology

## 2012-06-11 ENCOUNTER — Encounter (HOSPITAL_COMMUNITY): Payer: Self-pay | Admitting: Anesthesiology

## 2012-06-11 DIAGNOSIS — E669 Obesity, unspecified: Secondary | ICD-10-CM | POA: Diagnosis present

## 2012-06-11 DIAGNOSIS — E7800 Pure hypercholesterolemia, unspecified: Secondary | ICD-10-CM | POA: Diagnosis present

## 2012-06-11 DIAGNOSIS — C20 Malignant neoplasm of rectum: Principal | ICD-10-CM | POA: Diagnosis present

## 2012-06-11 DIAGNOSIS — R339 Retention of urine, unspecified: Secondary | ICD-10-CM | POA: Diagnosis not present

## 2012-06-11 DIAGNOSIS — C189 Malignant neoplasm of colon, unspecified: Secondary | ICD-10-CM

## 2012-06-11 DIAGNOSIS — Z01812 Encounter for preprocedural laboratory examination: Secondary | ICD-10-CM

## 2012-06-11 DIAGNOSIS — R209 Unspecified disturbances of skin sensation: Secondary | ICD-10-CM | POA: Diagnosis not present

## 2012-06-11 DIAGNOSIS — I252 Old myocardial infarction: Secondary | ICD-10-CM

## 2012-06-11 DIAGNOSIS — I2510 Atherosclerotic heart disease of native coronary artery without angina pectoris: Secondary | ICD-10-CM | POA: Diagnosis present

## 2012-06-11 HISTORY — PX: LAPAROSCOPIC ASSISTED ABDOMINAL PERINEAL RESECTION: SHX5888

## 2012-06-11 LAB — TYPE AND SCREEN
ABO/RH(D): B POS
Antibody Screen: NEGATIVE

## 2012-06-11 SURGERY — RESECTION, ABDOMINOPERINEAL, LAPAROSCOPIC
Anesthesia: General | Site: Abdomen | Wound class: Clean Contaminated

## 2012-06-11 MED ORDER — METOPROLOL TARTRATE 25 MG PO TABS
25.0000 mg | ORAL_TABLET | Freq: Two times a day (BID) | ORAL | Status: DC
  Administered 2012-06-11 – 2012-06-15 (×8): 25 mg via ORAL
  Filled 2012-06-11 (×9): qty 1

## 2012-06-11 MED ORDER — KETAMINE HCL 10 MG/ML IJ SOLN
INTRAMUSCULAR | Status: DC | PRN
  Administered 2012-06-11 (×2): 10 mg via INTRAVENOUS

## 2012-06-11 MED ORDER — MIDAZOLAM HCL 5 MG/5ML IJ SOLN
INTRAMUSCULAR | Status: DC | PRN
  Administered 2012-06-11: 2 mg via INTRAVENOUS

## 2012-06-11 MED ORDER — PROMETHAZINE HCL 25 MG/ML IJ SOLN: 6.2500 mg | INTRAMUSCULAR | Status: DC | PRN

## 2012-06-11 MED ORDER — DEXTROSE 5 % IV SOLN
2.0000 g | Freq: Two times a day (BID) | INTRAVENOUS | Status: AC
  Administered 2012-06-11: 2 g via INTRAVENOUS
  Filled 2012-06-11: qty 2

## 2012-06-11 MED ORDER — ONDANSETRON HCL 4 MG/2ML IJ SOLN
INTRAMUSCULAR | Status: DC | PRN
  Administered 2012-06-11: 4 mg via INTRAVENOUS
  Administered 2012-06-11 (×2): 2 mg via INTRAVENOUS

## 2012-06-11 MED ORDER — LIDOCAINE HCL (CARDIAC) 20 MG/ML IV SOLN
INTRAVENOUS | Status: DC | PRN
  Administered 2012-06-11: 30 mg via INTRAVENOUS

## 2012-06-11 MED ORDER — POTASSIUM CHLORIDE CRYS ER 20 MEQ PO TBCR
20.0000 meq | EXTENDED_RELEASE_TABLET | Freq: Two times a day (BID) | ORAL | Status: DC
  Administered 2012-06-11 – 2012-06-15 (×9): 20 meq via ORAL
  Filled 2012-06-11 (×10): qty 1

## 2012-06-11 MED ORDER — TAMSULOSIN HCL 0.4 MG PO CAPS
0.4000 mg | ORAL_CAPSULE | Freq: Every day | ORAL | Status: DC
  Administered 2012-06-12 – 2012-06-15 (×4): 0.4 mg via ORAL
  Filled 2012-06-11 (×5): qty 1

## 2012-06-11 MED ORDER — CISATRACURIUM BESYLATE (PF) 10 MG/5ML IV SOLN
INTRAVENOUS | Status: DC | PRN
  Administered 2012-06-11: 2 mg via INTRAVENOUS
  Administered 2012-06-11: 10 mg via INTRAVENOUS
  Administered 2012-06-11 (×2): 2 mg via INTRAVENOUS
  Administered 2012-06-11: 1 mg via INTRAVENOUS
  Administered 2012-06-11: 3 mg via INTRAVENOUS
  Administered 2012-06-11: 2 mg via INTRAVENOUS
  Administered 2012-06-11: 1 mg via INTRAVENOUS
  Administered 2012-06-11 (×4): 2 mg via INTRAVENOUS
  Administered 2012-06-11: 1 mg via INTRAVENOUS
  Administered 2012-06-11 (×2): 2 mg via INTRAVENOUS

## 2012-06-11 MED ORDER — EPHEDRINE SULFATE 50 MG/ML IJ SOLN
INTRAMUSCULAR | Status: DC | PRN
  Administered 2012-06-11 (×4): 10 mg via INTRAVENOUS
  Administered 2012-06-11: 5 mg via INTRAVENOUS
  Administered 2012-06-11 (×3): 10 mg via INTRAVENOUS
  Administered 2012-06-11: 5 mg via INTRAVENOUS
  Administered 2012-06-11: 10 mg via INTRAVENOUS
  Administered 2012-06-11 (×2): 5 mg via INTRAVENOUS
  Administered 2012-06-11: 10 mg via INTRAVENOUS

## 2012-06-11 MED ORDER — ATORVASTATIN CALCIUM 20 MG PO TABS
20.0000 mg | ORAL_TABLET | Freq: Every morning | ORAL | Status: DC
  Administered 2012-06-12 – 2012-06-15 (×4): 20 mg via ORAL
  Filled 2012-06-11 (×4): qty 1

## 2012-06-11 MED ORDER — ONDANSETRON HCL 4 MG/2ML IJ SOLN
4.0000 mg | Freq: Four times a day (QID) | INTRAMUSCULAR | Status: DC | PRN
  Administered 2012-06-13: 4 mg via INTRAVENOUS
  Filled 2012-06-11: qty 2

## 2012-06-11 MED ORDER — HEPARIN SODIUM (PORCINE) 5000 UNIT/ML IJ SOLN
5000.0000 [IU] | Freq: Once | INTRAMUSCULAR | Status: AC
  Administered 2012-06-11: 5000 [IU] via SUBCUTANEOUS
  Filled 2012-06-11: qty 1

## 2012-06-11 MED ORDER — ENOXAPARIN SODIUM 40 MG/0.4ML ~~LOC~~ SOLN
40.0000 mg | SUBCUTANEOUS | Status: DC
  Administered 2012-06-12 – 2012-06-15 (×4): 40 mg via SUBCUTANEOUS
  Filled 2012-06-11 (×5): qty 0.4

## 2012-06-11 MED ORDER — FENTANYL CITRATE 0.05 MG/ML IJ SOLN
INTRAMUSCULAR | Status: DC | PRN
  Administered 2012-06-11: 25 ug via INTRAVENOUS
  Administered 2012-06-11: 75 ug via INTRAVENOUS
  Administered 2012-06-11 (×3): 25 ug via INTRAVENOUS
  Administered 2012-06-11: 50 ug via INTRAVENOUS
  Administered 2012-06-11: 25 ug via INTRAVENOUS
  Administered 2012-06-11: 50 ug via INTRAVENOUS
  Administered 2012-06-11: 25 ug via INTRAVENOUS
  Administered 2012-06-11: 50 ug via INTRAVENOUS
  Administered 2012-06-11 (×2): 25 ug via INTRAVENOUS
  Administered 2012-06-11: 50 ug via INTRAVENOUS
  Administered 2012-06-11: 25 ug via INTRAVENOUS

## 2012-06-11 MED ORDER — BUPIVACAINE-EPINEPHRINE 0.25% -1:200000 IJ SOLN
INTRAMUSCULAR | Status: DC | PRN
  Administered 2012-06-11: 10 mL

## 2012-06-11 MED ORDER — ALVIMOPAN 12 MG PO CAPS
12.0000 mg | ORAL_CAPSULE | Freq: Once | ORAL | Status: AC
  Administered 2012-06-11: 12 mg via ORAL
  Filled 2012-06-11: qty 1

## 2012-06-11 MED ORDER — LACTATED RINGERS IV SOLN
INTRAVENOUS | Status: DC | PRN
  Administered 2012-06-11 (×5): via INTRAVENOUS

## 2012-06-11 MED ORDER — HYDROMORPHONE HCL PF 1 MG/ML IJ SOLN: 0.2500 mg | INTRAMUSCULAR | Status: DC | PRN

## 2012-06-11 MED ORDER — PROPOFOL 10 MG/ML IV EMUL
INTRAVENOUS | Status: DC | PRN
  Administered 2012-06-11: 200 mg via INTRAVENOUS

## 2012-06-11 MED ORDER — ALVIMOPAN 12 MG PO CAPS
12.0000 mg | ORAL_CAPSULE | Freq: Two times a day (BID) | ORAL | Status: DC
  Administered 2012-06-12 – 2012-06-14 (×6): 12 mg via ORAL
  Filled 2012-06-11 (×8): qty 1

## 2012-06-11 MED ORDER — GLYCOPYRROLATE 0.2 MG/ML IJ SOLN
INTRAMUSCULAR | Status: DC | PRN
  Administered 2012-06-11: 0.6 mg via INTRAVENOUS

## 2012-06-11 MED ORDER — DEXTROSE 5 % IV SOLN
2.0000 g | INTRAVENOUS | Status: AC
  Administered 2012-06-11: 2 g via INTRAVENOUS
  Filled 2012-06-11: qty 2

## 2012-06-11 MED ORDER — LACTATED RINGERS IV SOLN: INTRAVENOUS | Status: DC

## 2012-06-11 MED ORDER — DEXAMETHASONE SODIUM PHOSPHATE 4 MG/ML IJ SOLN
INTRAMUSCULAR | Status: DC | PRN
  Administered 2012-06-11: 8 mg via INTRAVENOUS

## 2012-06-11 MED ORDER — LACTATED RINGERS IR SOLN
Status: DC | PRN
  Administered 2012-06-11: 1000 mL

## 2012-06-11 MED ORDER — DIPHENHYDRAMINE HCL 50 MG/ML IJ SOLN: 12.5000 mg | Freq: Four times a day (QID) | INTRAMUSCULAR | Status: DC | PRN

## 2012-06-11 MED ORDER — SODIUM CHLORIDE 0.9 % IJ SOLN: 9.0000 mL | INTRAMUSCULAR | Status: DC | PRN

## 2012-06-11 MED ORDER — NALOXONE HCL 0.4 MG/ML IJ SOLN: 0.4000 mg | INTRAMUSCULAR | Status: DC | PRN

## 2012-06-11 MED ORDER — EZETIMIBE 10 MG PO TABS
10.0000 mg | ORAL_TABLET | Freq: Every morning | ORAL | Status: DC
  Administered 2012-06-12 – 2012-06-15 (×4): 10 mg via ORAL
  Filled 2012-06-11 (×4): qty 1

## 2012-06-11 MED ORDER — PHENYLEPHRINE HCL 10 MG/ML IJ SOLN
INTRAMUSCULAR | Status: DC | PRN
  Administered 2012-06-11 (×2): 40 ug via INTRAVENOUS

## 2012-06-11 MED ORDER — DIPHENHYDRAMINE HCL 12.5 MG/5ML PO ELIX: 12.5000 mg | ORAL_SOLUTION | Freq: Four times a day (QID) | ORAL | Status: DC | PRN

## 2012-06-11 MED ORDER — ACETAMINOPHEN 10 MG/ML IV SOLN
INTRAVENOUS | Status: DC | PRN
  Administered 2012-06-11: 1000 mg via INTRAVENOUS

## 2012-06-11 MED ORDER — SUCCINYLCHOLINE CHLORIDE 20 MG/ML IJ SOLN
INTRAMUSCULAR | Status: DC | PRN
  Administered 2012-06-11: 150 mg via INTRAVENOUS

## 2012-06-11 MED ORDER — LACTATED RINGERS IV SOLN
INTRAVENOUS | Status: DC
  Administered 2012-06-11 (×2): via INTRAVENOUS

## 2012-06-11 MED ORDER — NEOSTIGMINE METHYLSULFATE 1 MG/ML IJ SOLN
INTRAMUSCULAR | Status: DC | PRN
  Administered 2012-06-11: 5 mg via INTRAVENOUS

## 2012-06-11 MED ORDER — MORPHINE SULFATE (PF) 1 MG/ML IV SOLN
INTRAVENOUS | Status: DC
  Administered 2012-06-11: 18:00:00 via INTRAVENOUS
  Administered 2012-06-11: 10.5 mg via INTRAVENOUS
  Administered 2012-06-11: 14:00:00 via INTRAVENOUS
  Administered 2012-06-12: 4.5 mg via INTRAVENOUS
  Administered 2012-06-12: 3 mg via INTRAVENOUS
  Administered 2012-06-12: 23 mg via INTRAVENOUS
  Administered 2012-06-12: 3 mg via INTRAVENOUS
  Administered 2012-06-12: 11:00:00 via INTRAVENOUS
  Administered 2012-06-13: 6 mg via INTRAVENOUS
  Administered 2012-06-13: 1.5 mg via INTRAVENOUS
  Filled 2012-06-11 (×2): qty 25

## 2012-06-11 MED ORDER — ALPRAZOLAM 1 MG PO TABS
1.0000 mg | ORAL_TABLET | Freq: Three times a day (TID) | ORAL | Status: DC | PRN
  Administered 2012-06-12 – 2012-06-14 (×4): 1 mg via ORAL
  Filled 2012-06-11 (×5): qty 1

## 2012-06-11 SURGICAL SUPPLY — 99 items
APPLIER CLIP 5 13 M/L LIGAMAX5 (MISCELLANEOUS)
APPLIER CLIP ROT 10 11.4 M/L (STAPLE) ×2
APR CLP MED LRG 11.4X10 (STAPLE) ×1
APR CLP MED LRG 5 ANG JAW (MISCELLANEOUS)
BANDAGE GAUZE ELAST BULKY 4 IN (GAUZE/BANDAGES/DRESSINGS) ×1 IMPLANT
BLADE EXTENDED COATED 6.5IN (ELECTRODE) ×3 IMPLANT
BLADE HEX COATED 2.75 (ELECTRODE) ×2 IMPLANT
BLADE SURG SZ10 CARB STEEL (BLADE) ×2 IMPLANT
CABLE HIGH FREQUENCY MONO STRZ (ELECTRODE) ×1 IMPLANT
CANISTER SUCTION 2500CC (MISCELLANEOUS) ×4 IMPLANT
CATH FOLEY SILVER 30CC 28FR (CATHETERS) IMPLANT
CELLS DAT CNTRL 66122 CELL SVR (MISCELLANEOUS) IMPLANT
CLIP APPLIE 5 13 M/L LIGAMAX5 (MISCELLANEOUS) ×1 IMPLANT
CLIP APPLIE ROT 10 11.4 M/L (STAPLE) ×1 IMPLANT
CLOTH BEACON ORANGE TIMEOUT ST (SAFETY) ×2 IMPLANT
COVER MAYO STAND STRL (DRAPES) ×2 IMPLANT
COVER SURGICAL LIGHT HANDLE (MISCELLANEOUS) ×4 IMPLANT
DECANTER SPIKE VIAL GLASS SM (MISCELLANEOUS) ×2 IMPLANT
DEVICE SUTURE ENDOST 10MM (ENDOMECHANICALS) ×1 IMPLANT
DRAIN CHANNEL 19F RND (DRAIN) ×2 IMPLANT
DRAPE LAPAROSCOPIC ABDOMINAL (DRAPES) ×2 IMPLANT
DRAPE LG THREE QUARTER DISP (DRAPES) ×3 IMPLANT
DRAPE UTILITY XL STRL (DRAPES) ×4 IMPLANT
DRAPE WARM FLUID 44X44 (DRAPE) ×3 IMPLANT
DRSG PAD ABDOMINAL 8X10 ST (GAUZE/BANDAGES/DRESSINGS) ×1 IMPLANT
DRSG TEGADERM 2-3/8X2-3/4 SM (GAUZE/BANDAGES/DRESSINGS) IMPLANT
DRSG TEGADERM 4X4.75 (GAUZE/BANDAGES/DRESSINGS) IMPLANT
ELECT REM PT RETURN 9FT ADLT (ELECTROSURGICAL) ×2
ELECTRODE REM PT RTRN 9FT ADLT (ELECTROSURGICAL) ×1 IMPLANT
EVACUATOR SILICONE 100CC (DRAIN) ×3 IMPLANT
FILTER SMOKE EVAC LAPAROSHD (FILTER) IMPLANT
GLOVE BIO SURGEON STRL SZ7.5 (GLOVE) ×1 IMPLANT
GLOVE BIOGEL PI IND STRL 7.0 (GLOVE) ×1 IMPLANT
GLOVE BIOGEL PI IND STRL 7.5 (GLOVE) IMPLANT
GLOVE BIOGEL PI INDICATOR 7.0 (GLOVE) ×6
GLOVE BIOGEL PI INDICATOR 7.5 (GLOVE) ×4
GLOVE SS BIOGEL STRL SZ 7 (GLOVE) IMPLANT
GLOVE SUPERSENSE BIOGEL SZ 7 (GLOVE) ×4
GLOVE SURG SS PI 7.0 STRL IVOR (GLOVE) ×1 IMPLANT
GOWN STRL NON-REIN LRG LVL3 (GOWN DISPOSABLE) ×3 IMPLANT
GOWN STRL REIN 3XL XLG LVL4 (GOWN DISPOSABLE) ×7 IMPLANT
GOWN STRL REIN XL XLG (GOWN DISPOSABLE) ×11 IMPLANT
HAND ACTIVATED (MISCELLANEOUS) IMPLANT
HANDLE STAPLE EGIA 4 XL (STAPLE) ×1 IMPLANT
KIT BASIN OR (CUSTOM PROCEDURE TRAY) ×3 IMPLANT
LEGGING LITHOTOMY PAIR STRL (DRAPES) ×1 IMPLANT
MESH STOMA (Mesh General) ×1 IMPLANT
NS IRRIG 1000ML POUR BTL (IV SOLUTION) ×3 IMPLANT
PACK GENERAL/GYN (CUSTOM PROCEDURE TRAY) ×2 IMPLANT
PENCIL BUTTON HOLSTER BLD 10FT (ELECTRODE) ×5 IMPLANT
RELOAD EGIA 60 MED/THCK PURPLE (STAPLE) ×2 IMPLANT
RELOAD ENDO STITCH (ENDOMECHANICALS) ×4 IMPLANT
RELOAD STAPLE 60 MED/THCK ART (STAPLE) IMPLANT
RELOAD SUT TRIPLE-STITCH 2-0 (ENDOMECHANICALS) IMPLANT
RETRACT II ENDO 10MM 32CML (ENDOMECHANICALS) ×2
RETRACTOR II ENDO 10MM 32CML (ENDOMECHANICALS) IMPLANT
RETRACTOR LONE STAR DISPOSABLE (INSTRUMENTS) ×2 IMPLANT
RETRACTOR STAY HOOK 5MM (MISCELLANEOUS) ×2 IMPLANT
RETRACTOR WND ALEXIS 18 MED (MISCELLANEOUS) IMPLANT
RTRCTR WOUND ALEXIS 18CM MED (MISCELLANEOUS)
SCISSORS LAP 5X35 DISP (ENDOMECHANICALS) ×2 IMPLANT
SEALER TISSUE G2 CVD JAW 35 (ENDOMECHANICALS) ×1 IMPLANT
SEALER TISSUE G2 CVD JAW 45CM (ENDOMECHANICALS) ×1
SET IRRIG TUBING LAPAROSCOPIC (IRRIGATION / IRRIGATOR) ×2 IMPLANT
SLEEVE SURGEON STRL (DRAPES) ×1 IMPLANT
SOLUTION ANTI FOG 6CC (MISCELLANEOUS) ×2 IMPLANT
SPONGE GAUZE 4X4 12PLY (GAUZE/BANDAGES/DRESSINGS) ×2 IMPLANT
SPONGE LAP 18X18 X RAY DECT (DISPOSABLE) ×4 IMPLANT
STAPLER VISISTAT 35W (STAPLE) ×2 IMPLANT
SUCTION POOLE TIP (SUCTIONS) ×2 IMPLANT
SUT ETHILON 2 0 PS N (SUTURE) ×2 IMPLANT
SUT NYLON 3 0 (SUTURE) ×1 IMPLANT
SUT PDS AB 1 CT1 27 (SUTURE) IMPLANT
SUT PDS AB 1 CTX 36 (SUTURE) ×2 IMPLANT
SUT PDS AB 1 TP1 96 (SUTURE) IMPLANT
SUT PROLENE 2 0 KS (SUTURE) IMPLANT
SUT SILK 2 0 (SUTURE) ×2
SUT SILK 2 0 SH CR/8 (SUTURE) ×2 IMPLANT
SUT SILK 2-0 18XBRD TIE 12 (SUTURE) ×1 IMPLANT
SUT SILK 3 0 (SUTURE) ×2
SUT SILK 3 0 SH CR/8 (SUTURE) ×2 IMPLANT
SUT SILK 3-0 18XBRD TIE 12 (SUTURE) ×1 IMPLANT
SUT VIC AB 2-0 SH 18 (SUTURE) ×6 IMPLANT
SUT VIC AB 4-0 PS2 27 (SUTURE) ×1 IMPLANT
SUT VICRYL 0 UR6 27IN ABS (SUTURE) ×1 IMPLANT
SUT VICRYL 2 0 18  UND BR (SUTURE)
SUT VICRYL 2 0 18 UND BR (SUTURE) ×2 IMPLANT
SYR 30ML LL (SYRINGE) IMPLANT
SYS LAPSCP GELPORT 120MM (MISCELLANEOUS)
SYSTEM LAPSCP GELPORT 120MM (MISCELLANEOUS) IMPLANT
TOWEL OR 17X26 10 PK STRL BLUE (TOWEL DISPOSABLE) ×5 IMPLANT
TOWEL OR NON WOVEN STRL DISP B (DISPOSABLE) ×4 IMPLANT
TRAY FOLEY CATH 14FRSI W/METER (CATHETERS) ×2 IMPLANT
TRAY LAP CHOLE (CUSTOM PROCEDURE TRAY) ×2 IMPLANT
TROCAR BLADELESS OPT 12M 100M (ENDOMECHANICALS) ×2 IMPLANT
TROCAR BLADELESS OPT 5 100 (ENDOMECHANICALS) ×6 IMPLANT
TUBING FILTER THERMOFLATOR (ELECTROSURGICAL) ×2 IMPLANT
YANKAUER SUCT BULB TIP 10FT TU (MISCELLANEOUS) ×4 IMPLANT
YANKAUER SUCT BULB TIP NO VENT (SUCTIONS) ×2 IMPLANT

## 2012-06-11 NOTE — Op Note (Signed)
06/11/2012  2:12 PM  PATIENT:  Cody Coleman  67 y.o. male  Patient Care Team: Karleen Hampshire, MD as PCP - General (Family Medicine)  PRE-OPERATIVE DIAGNOSIS:  Low rectal cancer  POST-OPERATIVE DIAGNOSIS:  Low rectal cancer  PROCEDURE:  Procedure(s): LAPAROSCOPIC ASSISTED ABDOMINAL PERINEAL RESECTION  SURGEON:  Surgeon(s): Romie Levee, MD Lodema Pilot, DO  ASSISTANT: Biagio Quint  ANESTHESIA:   general  EBL:  Total I/O In: 4700 [I.V.:4700] Out: 255 [Urine:200; Other:5; Blood:50]  Delay start of Pharmacological VTE agent (>24hrs) due to surgical blood loss or risk of bleeding:  no  DRAINS: (19) Jackson-Pratt drain(s) with closed bulb suction in the pelvis   SPECIMEN:  Source of Specimen:  rectum and anus  DISPOSITION OF SPECIMEN:  PATHOLOGY  COUNTS:  YES  PLAN OF CARE: Admit to inpatient   PATIENT DISPOSITION:  PACU - hemodynamically stable.  INDICATION: this is a 67 year old male who was found to have a distal rectal cancer. This was shown to be a T2 cancer on ultrasound. Given these findings he was offered neoadjuvant chemotherapy and radiation with possible sphincter preserving procedure versus abdominoperineal resection.  He elected for the latter. We're here today for this procedure.  OR FINDINGS: rectal tumor noted in the distal left posterior position.  DESCRIPTION: The patient was identified in the preoperative holding area and taken to the OR where they were laid supine on the operating room table. Gen. anesthesia was smoothly induced.  The patient was then placed in lithotomy position and a Foley catheter was inserted under sterile conditions. The patient was then prepped and draped in the usual sterile fashion. A surgical timeout was performed indicating the correct patient, procedure, positioning and preoperative antibiotics. SCDs were noted to be in place and functioning prior to the operation.  After this was completed a supraumbilical incision was  made at midline and dissection was carried down to the level of the fascia. The fascia was elevated using 2 Kocher glands. The fascia was incised with a scalpel. The peritoneum was entered bluntly. The abdomen insufflated to approximately 15 mm of mercury. A Babcock camera was introduced into the abdomen. There are no signs of metastatic disease. And liver appeared normal. There were no peritoneal metastases. The remaining bowel appeared normal as well. The patient had a large amount of redundant sigmoid colon. This was elevated out of the pelvis. The remaining small bowel was also removed from the pelvis. A 5 mm right lower quadrant and suprapubic port were placed under direct visitation. A 12 mm port was placed through the projected ostomy site in the left lower quadrant. After this was completed the presacral plane was entered at the sacral promontory using sharp and blunt dissection. This dissection was carried down to the level of the coccyx. The lateral sidewall were mobilized and then transected using the Enseal device. Before taking the anterior reflection I terminated into the vascular pedicle. Dissection was carried out bluntly around the vascular pedicle. I identified the ureter in the retroperitoneum and mobilized this out of our resection pathway.  The hemorrhoidal artery was taken using the Enseal device. The remaining sigmoid artery was then transected approximately 2 cm distal to the takeoff from the aorta using the Enseal device. The vein was transected in similar fashion. The remaining mesentery was transected using the Enseal device to the level of the colonic wall. The colon was then transected with a Endo GIA purple load. The proximal end was then placed into the abdomen and the distal  end was placed into the pelvis. We then continued our dissection down into the pelvis past the level of the coccyx. The anterior peritoneal reflection was opened and the prostate and seminal vesicles were  retracted upward using a fan retractor. Once we were past the level of the prostate and seminal vesicles and we were down to the level of the levators laterally, I transitioned to the patient's perineum. An elliptical incision was made around the anal canal using Bovie electrocautery. Dissection was carried around the anal canal using electrocautery. I carried this dissection posteriorly to the level of the tip of the coccyx. I was then able to enter the abdomen and developed a defect for which the proximal rectum could be pulled through. Once the rectum was pulled through the remaining attachments were taken using Bovie electrocautery. The specimen was then removed and sent to pathology for further examination. Hemostasis was achieved using direct pressure area and laparoscopically the peritoneal reflection was then closed using an Endo Stitch device. A 19 [place] Blake drain was placed into the pelvis and brought out through the right lower corner port site. The peritoneum was then closed using interrupted 2-0 Vicryl sutures in multiple layers. The skin was left open for drainage. A sterile dressing was applied. I then turned my attention back to the abdomen. After reprepping and draping I affixed the drain to the right lower quadrant using a 2-0 nylon suture. The omentum was then brought down and placed into the pelvis. The small bowel was pulled out of the pelvis. The distal sigmoid stump was then identified laparoscopically. A laparoscopic retractor was placed on the staple line. I then created I left lower quadrant defect for the ostomy around the previously placed 12 mm port. A plug of subcutaneous tissue was removed. The fascia was identified and slightly enlarged. I then identified the posterior sheath of the fascia and created a plane in between the rectus and the posterior sheath. A Cook bioprosthetic device was then brought onto the field. This was then placed into the preperitoneal space around the  ostomy site. The ostomy was then pulled through the device through a defect in the middle.  The abdomen was then reinsufflated. The ostomy was oriented and the abdomen was reevaluated. There were no signs of active of bleeding. The abdomen was then desufflated. The midline port was then removed and the umbilical site was closed using 3-0 Vicryl sutures. The skin of both of these ports sites left were closed using 4-0 subcuticular Vicryl suture. Dermabond was applied to both of these incisions. After this was completed the ostomy was matured in standard Garrison fashion. The ostomy was patent and allowed one finger width between the fascia and the ostomy itself. There was good bleeding upon removal of the staple line prior to maturation of the ostomy. An ostomy appliance was then placed. The patient was awakened from anesthesia and sent to the post anesthesia care unit in stable condition. All counts were correct per operating room staff.

## 2012-06-11 NOTE — Anesthesia Postprocedure Evaluation (Signed)
  Anesthesia Post-op Note  Patient: Cody Coleman  Procedure(s) Performed: Procedure(s) (LRB): LAPAROSCOPIC ASSISTED ABDOMINAL PERINEAL RESECTION (N/A)  Patient Location: PACU  Anesthesia Type: General  Level of Consciousness: awake and alert   Airway and Oxygen Therapy: Patient Spontanous Breathing  Post-op Pain: mild  Post-op Assessment: Post-op Vital signs reviewed, Patient's Cardiovascular Status Stable, Respiratory Function Stable, Patent Airway and No signs of Nausea or vomiting  Last Vitals:  Filed Vitals:   06/11/12 1816  BP:   Pulse:   Temp:   Resp: 15    Post-op Vital Signs: stable   Complications: No apparent anesthesia complications

## 2012-06-11 NOTE — Progress Notes (Signed)
Reported called to [place], RN on fourth floor

## 2012-06-11 NOTE — Anesthesia Preprocedure Evaluation (Addendum)
Anesthesia Evaluation  Patient identified by MRN, date of birth, ID band Patient awake    Reviewed: Allergy & Precautions, H&P , NPO status , Patient's Chart, lab work & pertinent test results  Airway Mallampati: II TM Distance: >3 FB Neck ROM: Full    Dental no notable dental hx.    Pulmonary neg pulmonary ROS,  breath sounds clear to auscultation  Pulmonary exam normal       Cardiovascular Exercise Tolerance: Good + CAD and + Past MI negative cardio ROS  Rhythm:Regular Rate:Normal  Cardiology clearance note from 05-29-12 reviewed.  No cardiopulmonary symptoms since CABG in 2005   Neuro/Psych negative neurological ROS  negative psych ROS   GI/Hepatic negative GI ROS, Neg liver ROS,   Endo/Other  negative endocrine ROS  Renal/GU negative Renal ROS  negative genitourinary   Musculoskeletal negative musculoskeletal ROS (+)   Abdominal (+) + obese,   Peds negative pediatric ROS (+)  Hematology negative hematology ROS (+)   Anesthesia Other Findings   Reproductive/Obstetrics negative OB ROS                          Anesthesia Physical Anesthesia Plan  ASA: III  Anesthesia Plan: General   Post-op Pain Management:    Induction: Intravenous  Airway Management Planned: Oral ETT  Additional Equipment:   Intra-op Plan:   Post-operative Plan: Extubation in OR  Informed Consent: I have reviewed the patients History and Physical, chart, labs and discussed the procedure including the risks, benefits and alternatives for the proposed anesthesia with the patient or authorized representative who has indicated his/her understanding and acceptance.   Dental advisory given  Plan Discussed with: CRNA  Anesthesia Plan Comments:         Anesthesia Quick Evaluation

## 2012-06-11 NOTE — Progress Notes (Signed)
Utilization Review completed.  Treacy Holcomb RN CM  

## 2012-06-11 NOTE — Transfer of Care (Signed)
Immediate Anesthesia Transfer of Care Note  Patient: Cody Coleman  Procedure(s) Performed: Procedure(s): LAPAROSCOPIC ASSISTED ABDOMINAL PERINEAL RESECTION (N/A)  Patient Location: PACU  Anesthesia Type:General  Level of Consciousness: awake and sedated  Airway & Oxygen Therapy: Patient Spontanous Breathing and Patient connected to face mask oxygen  Post-op Assessment: Report given to PACU RN and Post -op Vital signs reviewed and stable  Post vital signs: stable  Complications: No apparent anesthesia complications

## 2012-06-11 NOTE — Interval H&P Note (Signed)
History and Physical Interval Note:  06/11/2012 7:08 AM  Cody Coleman  has presented today for surgery, with the diagnosis of rectal cancer  The various methods of treatment have been discussed with the patient and family. After consideration of risks, benefits and other options for treatment, the patient has consented to  Procedure(s): LAPAROSCOPIC ASSISTED ABDOMINAL PERINEAL RESECTION (N/A) as a surgical intervention .  The patient's history has been reviewed, patient examined, no change in status, stable for surgery.  I have reviewed the patient's chart and labs.  Questions were answered to the patient's satisfaction.     Vanita Panda, MD  Colorectal and General Surgery [facility]

## 2012-06-11 NOTE — Progress Notes (Signed)
Pt complaining of numbness and tingling in left thumb, first finger, and middle finger. Per pt, this was present after waking up from surgery. Pt was able to move all fingers, all fingers were pink and warm. IV was stopped. IV team was paged, and a new IV was placed. After removal, pt reported having more feeling in fingers. Pt was able to move all fingers, all fingers were pink and warm, with 1 sec capillary refill. Will continue to monitor pt.

## 2012-06-11 NOTE — H&P (View-Only) (Signed)
Patient ID: Cody Coleman, male   DOB: 03/21/1945, 66 y.o.   MRN: [id number] I spoke with him about his situation and how I felt Dr Thomas was the most qualified person in our practice to do anal/sphincter sparing surgery if it would be possible.  He will call back tomorrow and set up an appointment for consultation with her. 

## 2012-06-12 ENCOUNTER — Encounter (HOSPITAL_COMMUNITY): Payer: Self-pay | Admitting: General Surgery

## 2012-06-12 LAB — CBC
Hemoglobin: 12.6 g/dL — ABNORMAL LOW (ref 13.0–17.0)
MCH: 31.5 pg (ref 26.0–34.0)
RBC: 4 MIL/uL — ABNORMAL LOW (ref 4.22–5.81)
WBC: 10.6 10*3/uL — ABNORMAL HIGH (ref 4.0–10.5)

## 2012-06-12 LAB — BASIC METABOLIC PANEL
GFR calc Af Amer: >90 mL/min (ref >90)
GFR calc non Af Amer: >90 mL/min (ref >90)
Glucose, Bld: 129 mg/dL — ABNORMAL HIGH (ref 70–99)
Potassium: 4.2 mEq/L (ref 3.5–5.1)
Sodium: 136 mEq/L (ref 135–145)

## 2012-06-12 MED ORDER — KCL IN DEXTROSE-NACL 20-5-0.45 MEQ/L-%-% IV SOLN
INTRAVENOUS | Status: DC
  Administered 2012-06-12 – 2012-06-13 (×4): via INTRAVENOUS
  Filled 2012-06-12 (×3): qty 1000

## 2012-06-12 NOTE — Consult Note (Signed)
WOC ostomy consult  Stoma type/location: LLQ, end colostomy Stomal assessment/size: aprox. 1 5/8" round, appears budded from the skin, pink Peristomal assessment: pouching intact from OR Output minimal, bloody drainage in pouch Ostomy pouching: 1pc. Karaya in place from OR.   Education provided:  Wife at bedside, answered questions about pouching.  Will plan pouch change tom.  Wife has already reviewed literature provided preop per [facility] nurse and visited website.  I have ordered 2 pc pouches to the bedside and reviewed basic ostomy care with pt and wife today.    WOC team will follow along with you for ostomy teaching and support Victoriana Aziz [place] RN,CWOCN [phone]

## 2012-06-12 NOTE — Progress Notes (Signed)
PT NOTE  Order received. Chart reviewed. Attempted PT eval. Pt/wife report some issues while getting up this am-waiting on MD to come by to check pt's stitches. Will check back this afternoon. Thanks. [name], PT  [phone]

## 2012-06-12 NOTE — Evaluation (Signed)
Physical Therapy Evaluation Patient Details Name: Cody Coleman MRN: [id number] DOB: [birth year] Today's Date: 06/12/2012 Time: 6213-0865 PT Time Calculation (min): 27 min  PT Assessment / Plan / Recommendation Clinical Impression  67 yo male s/p lap abdomen, perineal resection. On eval, pt required Min-Mod assist for mobility-able to ambulate ~200 feet while pushing IV pole. Educated pt on proper technique for bed mobility. Encouraged pt to ambulate at least one more time today with nursing assistance. Anticipate pt will progress well during stay.     PT Assessment  Patient needs continued PT services    Follow Up Recommendations  No PT follow up    Does the patient have the potential to tolerate intense rehabilitation      Barriers to Discharge        Equipment Recommendations   (will continue to assess for possible need for walker)    Recommendations for Other Services OT consult   Frequency Min 3X/week    Precautions / Restrictions Precautions Precautions: None Precaution Comments: abdominal and perineal area surgical incisions. JP drain Restrictions Weight Bearing Restrictions: No   Pertinent Vitals/Pain 3-4/10 abdomen/buttocks area      Mobility  Bed Mobility Bed Mobility: Rolling Left;Left Sidelying to Sit;Sit to Sidelying Left Rolling Left: 4: Min assist Left Sidelying to Sit: 3: Mod assist Sit to Sidelying Left: 3: Mod assist Details for Bed Mobility Assistance: Assist for bil LEs off/onto bed and trunk to upright/supine. VCs safety, technique, hand placement Transfers Transfers: Sit to Stand;Stand to Sit Sit to Stand: 4: Min assist;From bed Stand to Sit: 4: Min guard;To bed Details for Transfer Assistance: VCS safety, technique, hand placement. Assist to rise, stabilize, control descent Ambulation/Gait Ambulation/Gait Assistance: 4: Min guard Ambulation Distance (Feet): 200 Feet Assistive device: Other (Comment) (pushed IV pole) Ambulation/Gait  Assistance Details: Slow gait speed, little to no arm swing. Pt very nervous.  Gait Pattern: Decreased step length - right;Decreased step length - left;Decreased stride length;Step-through pattern    Exercises     PT Diagnosis: Difficulty walking;Acute pain  PT Problem List: Decreased activity tolerance;Decreased mobility;Pain PT Treatment Interventions: Gait training;Functional mobility training;Therapeutic activities;Therapeutic exercise;Patient/family education   PT Goals Acute Rehab PT Goals PT Goal Formulation: With patient/family Time For Goal Achievement: 06/26/12 Potential to Achieve Goals: Good Pt will Roll Supine to Left Side: with supervision PT Goal: Rolling Supine to Left Side - Progress: Goal set today Pt will go Supine/Side to Sit: with supervision PT Goal: Supine/Side to Sit - Progress: Goal set today Pt will go Sit to Stand: with supervision PT Goal: Sit to Stand - Progress: Goal set today Pt will Ambulate: 51 - 150 feet;with supervision;with least restrictive assistive device PT Goal: Ambulate - Progress: Goal set today  Visit Information  Last PT Received On: 06/12/12 Assistance Needed: +1    Subjective Data  Subjective: I had a bad experience this morning (when OOB with nursing) Patient Stated Goal: Regain independence   Prior Functioning  Home Living Lives With: Spouse Available Help at Discharge: Family Type of Home: House Home Access: Stairs to enter Secretary/administrator of Steps: 2 Entrance Stairs-Rails: None Home Layout: One level Bathroom Shower/Tub: Banker: None Prior Function Level of Independence: Independent Able to Take Stairs?: Yes Driving: Yes Communication Communication: No difficulties    Cognition  Cognition Arousal/Alertness: Awake/alert Behavior During Therapy: WFL for tasks assessed/performed Overall Cognitive Status: Within Functional Limits for tasks assessed    Extremity/Trunk Assessment  Right Lower Extremity Assessment RLE ROM/Strength/Tone:  WFL for tasks assessed Left Lower Extremity Assessment LLE ROM/Strength/Tone: [facility] for tasks assessed Trunk Assessment Trunk Assessment: Normal   Balance    End of Session PT - End of Session Activity Tolerance: Patient tolerated treatment well Patient left: in bed;with call bell/phone within reach;with family/visitor present  GP     [name], MPT Pager: [phone]

## 2012-06-12 NOTE — Progress Notes (Signed)
1 Day Post-Op Lap APR Subjective: Pt doing well.  He complains of numbness in his L hand in a median nerve distribution.  No nausea.  Pain controlled with PCA. Good UOP.  Objective: Vital signs in last 24 hours: Temp:  [97.8 F (36.6 C)-99 F (37.2 C)] 98.3 F (36.8 C) (05/13 0549) Pulse Rate:  [81-100] 81 (05/13 0549) Resp:  [5-18] 15 (05/13 0549) BP: (100-143)/(59-75) 110/74 mmHg (05/13 0549) SpO2:  [94 %-100 %] 97 % (05/13 0549) FiO2 (%):  [39 %] 39 % (05/13 0750) Weight:  [197 lb (89.359 kg)] 197 lb (89.359 kg) (05/12 1450)   Intake/Output from previous day: 05/12 0701 - 05/13 0700 In: 5950 [I.V.:5900; IV Piggyback:50] Out: 2182 [Urine:2125; Drains:2; Blood:50] Intake/Output this shift:     General appearance: alert and cooperative GI: normal findings: soft, non-tender  Incision: no significant drainage, no significant erythema  Lab Results:   Recent Labs  06/12/12 0447  WBC 10.6*  HGB 12.6*  HCT 36.1*  PLT 144*   BMET  Recent Labs  06/12/12 0447  NA 136  K 4.2  CL 100  CO2 28  GLUCOSE 129*  BUN 10  CREATININE 0.75  CALCIUM 9.0   PT/INR No results found for this basename: LABPROT, INR,  in the last 72 hours ABG No results found for this basename: PHART, PCO2, PO2, HCO3,  in the last 72 hours  MEDS, Scheduled . alvimopan  12 mg Oral BID  . atorvastatin  20 mg Oral q morning - 10a  . enoxaparin (LOVENOX) injection  40 mg Subcutaneous Q24H  . ezetimibe  10 mg Oral q morning - 10a  . metoprolol tartrate  25 mg Oral BID  . morphine   Intravenous Q4H  . potassium chloride SA  20 mEq Oral BID  . tamsulosin  0.4 mg Oral QPC breakfast    Studies/Results: No results found.  Assessment: s/p Procedure(s): LAPAROSCOPIC ASSISTED ABDOMINAL PERINEAL RESECTION Patient Active Problem List   Diagnosis Date Noted  . Rectal cancer 05/03/2012  . High cholesterol 04/12/2012  . CAD (coronary artery disease) of artery bypass graft 04/12/2012   Expected  post op course  Plan: Advance diet to clear liquids Ambulate in halls, PT consulted Cont dvt prohylaxis, inc spir Ostomy teaching   LOS: 1 day     .Vanita Panda, MD [facility], [place] [phone]   06/12/2012 8:17 AM

## 2012-06-13 LAB — BASIC METABOLIC PANEL
CO2: 28 mEq/L (ref 19–32)
Chloride: 98 mEq/L (ref 96–112)
Glucose, Bld: 112 mg/dL — ABNORMAL HIGH (ref 70–99)
Potassium: 4 mEq/L (ref 3.5–5.1)
Sodium: 134 mEq/L — ABNORMAL LOW (ref 135–145)

## 2012-06-13 LAB — CBC
HCT: 35 % — ABNORMAL LOW (ref 39.0–52.0)
Hemoglobin: 11.9 g/dL — ABNORMAL LOW (ref 13.0–17.0)
MCH: 31.3 pg (ref 26.0–34.0)
MCV: 92.1 fL (ref 78.0–100.0)
RBC: 3.8 MIL/uL — ABNORMAL LOW (ref 4.22–5.81)

## 2012-06-13 MED ORDER — OXYCODONE-ACETAMINOPHEN 5-325 MG PO TABS
1.0000 | ORAL_TABLET | ORAL | Status: DC | PRN
  Administered 2012-06-14 (×5): 1 via ORAL
  Administered 2012-06-15: 2 via ORAL
  Administered 2012-06-15 (×2): 1 via ORAL
  Filled 2012-06-13 (×3): qty 1
  Filled 2012-06-13: qty 2
  Filled 2012-06-13 (×4): qty 1

## 2012-06-13 MED ORDER — MORPHINE SULFATE 2 MG/ML IJ SOLN
2.0000 mg | INTRAMUSCULAR | Status: DC | PRN
  Administered 2012-06-13 – 2012-06-14 (×3): 2 mg via INTRAVENOUS
  Filled 2012-06-13 (×4): qty 1

## 2012-06-13 NOTE — Progress Notes (Signed)
Physical Therapy Treatment Patient Details Name: Cody Coleman MRN: [id number] DOB: April 04, 1945 Today's Date: 06/13/2012 Time: 1478-2956 PT Time Calculation (min): 23 min  PT Assessment / Plan / Recommendation Comments on Treatment Session  Progressing well with mobility. Pt ambulated earliler with wife in hallway-recommended he continue this throughout day as tolerated. Wife stated that MD wanted "foam cushion" for pt to sit up in recliner-notified front desk to see if they could order one. Discussed possible need for tub/transfer bench-informed wife that these are available for purchase if they need one.     Follow Up Recommendations  No PT follow up     Does the patient have the potential to tolerate intense rehabilitation     Barriers to Discharge        Equipment Recommendations  None recommended by PT    Recommendations for Other Services    Frequency Min 3X/week   Plan Discharge plan remains appropriate    Precautions / Restrictions Precautions Precautions: None Precaution Comments: abdominal and perineal area surgical incisions. JP drain Restrictions Weight Bearing Restrictions: No   Pertinent Vitals/Pain Abdomen/buttocks-4/10     Mobility  Bed Mobility Bed Mobility: Rolling Right;Right Sidelying to Sit;Sit to Sidelying Right Rolling Right: 4: Min guard Right Sidelying to Sit: 4: Min assist Sit to Sidelying Right: 4: Min assist Details for Bed Mobility Assistance: Assist for bil LEs off/onto bed and trunk to upright/supine. VCs safety, technique, hand placement Transfers Transfers: Sit to Stand;Stand to Sit Sit to Stand: 4: Min guard;From bed;With upper extremity assist Stand to Sit: 5: Supervision Details for Transfer Assistance: VCS safety, technique, hand placement.  Ambulation/Gait Ambulation/Gait Assistance: 4: Min guard Ambulation Distance (Feet): 500 Feet Assistive device: None;Other (Comment) Ambulation/Gait Assistance Details: 250 feet with IV pole,  250 feet without support. Good gait speed. Improved arm swing.  Gait Pattern: Step-through pattern    Exercises     PT Diagnosis:    PT Problem List:   PT Treatment Interventions:     PT Goals Acute Rehab PT Goals Pt will go Supine/Side to Sit: with supervision PT Goal: Supine/Side to Sit - Progress: Progressing toward goal Pt will go Sit to Stand: with supervision PT Goal: Sit to Stand - Progress: Progressing toward goal Pt will Ambulate: 51 - 150 feet;with supervision;with least restrictive assistive device PT Goal: Ambulate - Progress: Progressing toward goal  Visit Information  Last PT Received On: 06/13/12 Assistance Needed: +1    Subjective Data  Subjective: If I can just get this bladder under control, i'll be good Patient Stated Goal: Regain independence   Cognition  Cognition Arousal/Alertness: Awake/alert Behavior During Therapy: WFL for tasks assessed/performed Overall Cognitive Status: Within Functional Limits for tasks assessed    Balance     End of Session PT - End of Session Activity Tolerance: Patient tolerated treatment well Patient left: in bed;with call bell/phone within reach;with family/visitor present   GP     [name], MPT Pager: [phone]

## 2012-06-13 NOTE — Progress Notes (Signed)
Removed pt's foley catheter at 05:50 this morning per MD order to remove 48 hours post surgery. Catheter intact upon removal, pt tolerated well. No complaints or signs of distress at this time. Will continue to monitor pt. - Christell Faith, RN

## 2012-06-13 NOTE — Progress Notes (Signed)
Pt reported being unable to void after initial void this am of . Pt bladder scanned. of urine found in bladder. Per standing orders, I&O cathed, of urine retrieved. Will contine to monitor pt. Pt encouraged to call RN if unable to void, and having the urge to void.

## 2012-06-13 NOTE — Progress Notes (Addendum)
2 Days Post-Op Lap APR Subjective: Pt doing well.  No nausea.  Pain controlled with PCA. Good UOP.  Tolerating clears.  Air noted in ostomy bag.  Objective: Vital signs in last 24 hours: Temp:  [98 F (36.7 C)-100.6 F (38.1 C)] 98 F (36.7 C) (05/14 0548) Pulse Rate:  [69-85] 82 (05/14 0548) Resp:  [15-22] 19 (05/14 0754) BP: (109-115)/(53-68) 109/67 mmHg (05/14 0548) SpO2:  [94 %-99 %] 95 % (05/14 0754)   Intake/Output from previous day: 05/13 0701 - 05/14 0700 In: -  Out: 2450 [Urine:2400; Drains:20; Stool:30] Intake/Output this shift:   General appearance: alert and cooperative GI: normal findings: soft, non-tender JP: sanguinous drainange Incision: bloody drainage at perineal inc, no significant erythema  Lab Results:   Recent Labs  06/12/12 0447 06/13/12 0443  WBC 10.6* 10.6*  HGB 12.6* 11.9*  HCT 36.1* 35.0*  PLT 144* 136*   BMET  Recent Labs  06/12/12 0447 06/13/12 0443  NA 136 134*  K 4.2 4.0  CL 100 98  CO2 28 28  GLUCOSE 129* 112*  BUN 10 9  CREATININE 0.75 0.70  CALCIUM 9.0 8.8   PT/INR No results found for this basename: LABPROT, INR,  in the last 72 hours ABG No results found for this basename: PHART, PCO2, PO2, HCO3,  in the last 72 hours  MEDS, Scheduled . alvimopan  12 mg Oral BID  . atorvastatin  20 mg Oral q morning - 10a  . enoxaparin (LOVENOX) injection  40 mg Subcutaneous Q24H  . ezetimibe  10 mg Oral q morning - 10a  . metoprolol tartrate  25 mg Oral BID  . potassium chloride SA  20 mEq Oral BID  . tamsulosin  0.4 mg Oral QPC breakfast    Studies/Results: No results found.  Assessment: s/p Procedure(s): LAPAROSCOPIC ASSISTED ABDOMINAL PERINEAL RESECTION Patient Active Problem List   Diagnosis Date Noted  . Rectal cancer 05/03/2012  . High cholesterol 04/12/2012  . CAD (coronary artery disease) of artery bypass graft 04/12/2012   Expected post op course  Plan: Advance diet to low fiber KVO IV D/c PCA, PO pain  meds Ambulate in halls, PT consulted Cont dvt prohylaxis, inc spir Ostomy teaching If pt has urinary retention, will replace foley  LOS: 2 days     .Vanita Panda, MD [facility], [place] [phone]   06/13/2012 8:44 AM

## 2012-06-14 LAB — BASIC METABOLIC PANEL
CO2: 31 mEq/L (ref 19–32)
Calcium: 8.6 mg/dL (ref 8.4–10.5)
Chloride: 98 mEq/L (ref 96–112)
Creatinine, Ser: 0.75 mg/dL (ref 0.50–1.35)
Glucose, Bld: 116 mg/dL — ABNORMAL HIGH (ref 70–99)

## 2012-06-14 LAB — CBC
HCT: 33.6 % — ABNORMAL LOW (ref 39.0–52.0)
MCH: 30.4 pg (ref 26.0–34.0)
MCV: 91.3 fL (ref 78.0–100.0)
Platelets: 135 10*3/uL — ABNORMAL LOW (ref 150–400)
RDW: 14.9 % (ref 11.5–15.5)

## 2012-06-14 NOTE — Consult Note (Signed)
WOC ostomy follow up Stoma type/location: LLQ, end colostomy Stomal assessment/size: slightly larger than 1 5/8" round, budded nicely from the skin Peristomal assessment: intact  Treatment options for stomal/peristomal skin: none needed Output bloody drainage only Ostomy pouching: 2pc. 2 1/4" pouch placed today with wife and pt performing most of the change Education provided:  Had wife and pt practice lock and roll closure multiple times. Demonstrated wick use for cleaning the bottom of the pouch after emptying.  Measured stoma, and had wife remove old pouch.  Reviewed peristomal skin care and wife cut new wafer to fit around the stoma, she then placed the new wafer on the skin and attached the new pouch.  Wife and patient very eager for independence.  Would like HHRN for continued teaching at home. Enrolled pt in [employer] dc program today for samples to go to the home. Left [employer] catalog in the room for wife.  WOC team will follow along with you  Armen Pickup RN,CWOCN [phone]

## 2012-06-14 NOTE — Progress Notes (Signed)
3 Days Post-Op Lap APR Subjective: Pt doing well.  No nausea.  Pain controlled. Good UOP.  He had trouble urinating yesterday afternoon and was straight cathed once.  He is urinating well now.  Tolerating a min diet.  Air noted in ostomy bag.   Objective: Vital signs in last 24 hours: Temp:  [97 F (36.1 C)-98.8 F (37.1 C)] 98.8 F (37.1 C) (05/15 0526) Pulse Rate:  [84-88] 88 (05/15 0526) Resp:  [18-19] 19 (05/15 0526) BP: (91-110)/(54-68) 106/60 mmHg (05/15 0526) SpO2:  [93 %-96 %] 96 % (05/15 0526)   Intake/Output from previous day: 05/14 0701 - 05/15 0700 In: 2886 [P.O.:1200; I.V.:1686] Out: 2660 [Urine:2625; Drains:35] Intake/Output this shift:   General appearance: alert and cooperative GI: normal findings: soft, non-tender JP: sero-sanguinous drainange Incision: bloody drainage at perineal inc, no significant erythema Ostomy- pink mucosa noted, no stool output, bag full of air  Lab Results:   Recent Labs  06/13/12 0443 06/14/12 0440  WBC 10.6* 10.3  HGB 11.9* 11.2*  HCT 35.0* 33.6*  PLT 136* 135*   BMET  Recent Labs  06/13/12 0443 06/14/12 0440  NA 134* 135  K 4.0 3.7  CL 98 98  CO2 28 31  GLUCOSE 112* 116*  BUN 9 8  CREATININE 0.70 0.75  CALCIUM 8.8 8.6   PT/INR No results found for this basename: LABPROT, INR,  in the last 72 hours ABG No results found for this basename: PHART, PCO2, PO2, HCO3,  in the last 72 hours  MEDS, Scheduled . alvimopan  12 mg Oral BID  . atorvastatin  20 mg Oral q morning - 10a  . enoxaparin (LOVENOX) injection  40 mg Subcutaneous Q24H  . ezetimibe  10 mg Oral q morning - 10a  . metoprolol tartrate  25 mg Oral BID  . potassium chloride SA  20 mEq Oral BID  . tamsulosin  0.4 mg Oral QPC breakfast    Studies/Results: No results found.  Assessment: s/p Procedure(s): LAPAROSCOPIC ASSISTED ABDOMINAL PERINEAL RESECTION Patient Active Problem List   Diagnosis Date Noted  . Rectal cancer 05/03/2012  . High  cholesterol 04/12/2012  . CAD (coronary artery disease) of artery bypass graft 04/12/2012   Expected post op course  Plan: Cont low fiber diet SL IV Cont PO pain meds Ambulate in halls, PT consulted Cont dvt prohylaxis, inc spir Ostomy teaching Anticipate d/c either tom or over the weekend  LOS: 3 days     .Vanita Panda, MD [facility], [place] [phone]   06/14/2012 9:08 AM

## 2012-06-14 NOTE — Progress Notes (Signed)
S: I went to see Cody Coleman as I was informed he is experiencing numbness in left hand.  He states that he is seeing improvement in these symptoms. Specifically, his left fourth(ring) and index finger are now almost completely back to normal sensation and he believes the thumb and third finger are improving. IV had been in left forearm (it is in a new site now).  O:  Motor/Strength intact. Can make a strong fist.  A/P: Perioperative left finger numbness/tingling, improving. Expectations for gradual improvement. Patient to let us know if symptoms don't resolve.

## 2012-06-14 NOTE — Progress Notes (Signed)
04:40-offered to ambulate the pt, but he declined to ambulate at this time

## 2012-06-14 NOTE — Progress Notes (Signed)
Patient walked 3 laps in the hall and worked with walking up steps with PT and tolerated well. Will continue to encourage ambulation.  Patient states "i feel like a new person today."

## 2012-06-14 NOTE — Progress Notes (Signed)
Physical Therapy Treatment Patient Details Name: Cody Coleman MRN: [id number] DOB: 10-Nov-1945 Today's Date: 06/14/2012 Time: 4098-1191 PT Time Calculation (min): 10 min  PT Assessment / Plan / Recommendation Comments on Treatment Session  Pt is ambulating independently now, no LOB. Stair training completed. No further PT indicated. PT goals met, DC PT.     Follow Up Recommendations  No PT follow up     Does the patient have the potential to tolerate intense rehabilitation     Barriers to Discharge        Equipment Recommendations  None recommended by PT    Recommendations for Other Services    Frequency Min 3X/week   Plan All goals met and education completed, patient dischaged from PT services    Precautions / Restrictions Precautions Precautions: None Precaution Comments: abdominal and perineal area surgical incisions. JP drain Restrictions Weight Bearing Restrictions: No   Pertinent Vitals/Pain **1/10 incisional pain*    Mobility  Bed Mobility Bed Mobility: Not assessed Transfers Transfers: Sit to Stand;Stand to Sit Sit to Stand: From bed;With upper extremity assist;6: Modified independent (Device/Increase time) Ambulation/Gait Ambulation/Gait Assistance: 7: Independent Ambulation Distance (Feet): 400 Feet Assistive device: None Gait Pattern: Within Functional Limits Gait velocity: WFL General Gait Details: no LOB Stairs: Yes Stairs Assistance: 5: Supervision Stairs Assistance Details (indicate cue type and reason): SBA for safety, no LOB Stair Management Technique: No rails;Forwards Number of Stairs: 4    Exercises     PT Diagnosis:    PT Problem List:   PT Treatment Interventions:     PT Goals Acute Rehab PT Goals Pt will go Supine/Side to Sit: with supervision Pt will go Sit to Stand: with supervision PT Goal: Sit to Stand - Progress: Met Pt will Ambulate: 51 - 150 feet;with supervision;with least restrictive assistive device PT Goal: Ambulate  - Progress: Met  Visit Information  Last PT Received On: 06/14/12 Assistance Needed: +1    Subjective Data  Subjective: Today is much better than yesterday.  Patient Stated Goal: hopes to go home tomorrow   Cognition  Cognition Arousal/Alertness: Awake/alert Behavior During Therapy: WFL for tasks assessed/performed Overall Cognitive Status: Within Functional Limits for tasks assessed    Balance     End of Session PT - End of Session Activity Tolerance: Patient tolerated treatment well Patient left: with family/visitor present;Other (comment) (walking in halls with wife) Nurse Communication: Mobility status   GP     Cody Coleman 06/14/2012, 1:32 PM [phone]

## 2012-06-15 MED ORDER — OXYCODONE-ACETAMINOPHEN 5-325 MG PO TABS: 1.0000 | ORAL_TABLET | ORAL | Status: DC | PRN

## 2012-06-15 NOTE — Discharge Summary (Addendum)
Physician Discharge Summary  Patient ID: Cody Coleman MRN: [id number] DOB/AGE: March 27, 1945 67 y.o.  Admit date: 06/11/2012 Discharge date: 06/15/2012  Admission Diagnoses: Rectal cancer  Discharge Diagnoses:  Active Problems:   Rectal cancer   Discharged Condition: good  Hospital Course: The patient was admitted to the floor after APR.  He tolerated the first night with some pain.  His diet was advanced to clears on POD 1.  His foley was removed on POD 2.  He did undergo straight cath x1 before he was able to urinate completely.  His diet was advanced to low fiber.  He began to ambulate well.  By POD 4 he was tolerating a regular diet and having ostomy output.  He was ambulating without difficulty.  It was decided to discharge to home.  Consults: WOC  Significant Diagnostic Studies: labs: cbc, chem  Treatments: IV hydration and analgesia: acetaminophen w/ codeine  Discharge Exam: Blood pressure 129/62, pulse 95, temperature 98.6 F (37 C), temperature source Oral, resp. rate 18, height 5\' 7"  (1.702 m), weight 197 lb (89.359 kg), SpO2 96.00%. General appearance: alert and cooperative Resp: clear to auscultation bilaterally, mild wheezing noted Cardio: regular rate and rhythm GI: soft, non-tender; bowel sounds normal; no masses,  no organomegaly Ext: L hand and arm with 5/5 strength, good ROM, sensation better in 3rd and 4th finger, 5/5 grip strength, still with min sensation in index and thumb but this has improved since directly post op  Disposition: 81-Discharged to home/self-care with a planned acute care hospital inpt readmission   Future Appointments Provider Department Dept Phone   07/02/2012 1:30 PM Randall An, MD [facility] [phone]   07/26/2012 7:45 AM [facility] Main Office [place]) [phone]       Medication List    TAKE these medications       ALPRAZolam 1 MG tablet  Commonly known as:  XANAX  Take 1 mg by mouth  3 (three) times daily as needed for sleep or anxiety. USUALLY WOULD ONLY TAKE 1/2 TAB IF NEEDED FOR SLEEP OR ANXIETY     aspirin 325 MG tablet  Take 325 mg by mouth daily.     atorvastatin 20 MG tablet  Commonly known as:  LIPITOR  Take 20 mg by mouth every morning.     ezetimibe 10 MG tablet  Commonly known as:  ZETIA  Take 10 mg by mouth every morning.     metoprolol tartrate 25 MG tablet  Commonly known as:  LOPRESSOR  Take 1 tablet (25 mg total) by mouth 2 (two) times daily.     oxyCODONE-acetaminophen 5-325 MG per tablet  Commonly known as:  PERCOCET/ROXICET  Take 1-2 tablets by mouth every 4 (four) hours as needed.     potassium chloride SA 20 MEQ tablet  Commonly known as:  K-DUR,KLOR-CON  Take 1 tablet (20 mEq total) by mouth 2 (two) times daily.     sildenafil 100 MG tablet  Commonly known as:  VIAGRA  Take 100 mg by mouth daily as needed for erectile dysfunction.     tamsulosin 0.4 MG Caps  Commonly known as:  FLOMAX  Take 0.4 mg by mouth daily after breakfast.           Follow-up Information   Follow up with Vanita Panda., MD. Schedule an appointment as soon as possible for a visit in 10 days.   Contact information:   [address], [address] [place] [address]  [phone]       Signed: Vanita Panda 06/15/2012, 7:38 AM

## 2012-06-16 ENCOUNTER — Emergency Department (HOSPITAL_COMMUNITY)
Admission: EM | Admit: 2012-06-16 | Discharge: 2012-06-16 | Disposition: A | Discharge status: Home / Self Care | Payer: 59 | Attending: General Surgery | Admitting: General Surgery

## 2012-06-16 DIAGNOSIS — Y838 Other surgical procedures as the cause of abnormal reaction of the patient, or of later complication, without mention of misadventure at the time of the procedure: Secondary | ICD-10-CM | POA: Insufficient documentation

## 2012-06-16 DIAGNOSIS — G479 Sleep disorder, unspecified: Secondary | ICD-10-CM | POA: Insufficient documentation

## 2012-06-16 DIAGNOSIS — Z87891 Personal history of nicotine dependence: Secondary | ICD-10-CM | POA: Insufficient documentation

## 2012-06-16 DIAGNOSIS — Z87448 Personal history of other diseases of urinary system: Secondary | ICD-10-CM | POA: Insufficient documentation

## 2012-06-16 DIAGNOSIS — Z7982 Long term (current) use of aspirin: Secondary | ICD-10-CM | POA: Insufficient documentation

## 2012-06-16 DIAGNOSIS — I2510 Atherosclerotic heart disease of native coronary artery without angina pectoris: Secondary | ICD-10-CM | POA: Insufficient documentation

## 2012-06-16 DIAGNOSIS — I252 Old myocardial infarction: Secondary | ICD-10-CM | POA: Insufficient documentation

## 2012-06-16 DIAGNOSIS — T8140XA Infection following a procedure, unspecified, initial encounter: Secondary | ICD-10-CM | POA: Insufficient documentation

## 2012-06-16 DIAGNOSIS — Z9861 Coronary angioplasty status: Secondary | ICD-10-CM | POA: Insufficient documentation

## 2012-06-16 DIAGNOSIS — C20 Malignant neoplasm of rectum: Secondary | ICD-10-CM

## 2012-06-16 DIAGNOSIS — E7800 Pure hypercholesterolemia, unspecified: Secondary | ICD-10-CM | POA: Insufficient documentation

## 2012-06-16 DIAGNOSIS — Z951 Presence of aortocoronary bypass graft: Secondary | ICD-10-CM | POA: Insufficient documentation

## 2012-06-16 DIAGNOSIS — Z79899 Other long term (current) drug therapy: Secondary | ICD-10-CM | POA: Insufficient documentation

## 2012-06-16 NOTE — ED Notes (Signed)
Dr. Hoxworth paged from [facility] 

## 2012-06-16 NOTE — Discharge Summary (Signed)
  History: Patient is a 67 year old male discharged just yesterday proximally one week following laparoscopic-assisted abdominoperineal resection for rectal cancer. His wife called this evening stating that his perineal wound looked very different to her with increased drainage and she was very worried about it. I asked him to come to the emergency department for evaluation. He had some low-grade fever last night but none today. He is eating okay and not having malaise or abdominal pain or chills.  Exam: BP 103/59  Pulse 87  Temp(Src) 98.8 F (37.1 C) (Oral)  Resp 17  Ht 5\' 7"  (1.702 m)  Wt 194 lb (87.998 kg)  BMI 30.38 kg/m2  SpO2 95% General: Alert and does not appear acutely ill Abdomen: Soft and nontender. Healthy colostomy. Laparoscopic incisions healing well. Perineum. The skin and superficial subcutaneous tissue are opened as was planned and left as such at the time of surgery. There is some serosanguineous drainage. No erythema or tenderness or induration.  Assessment and plan: I do not see any evidence of infection or complication. The wound appears clean and I think the drainage is appropriate. This was all discussed with the patient and his wife and I showed her how to gently pack the superficial open area with gauze. We will call for any changes and she has an appointment to see Dr. Maisie Fus in 10 days.

## 2012-06-16 NOTE — ED Notes (Signed)
Patient and wife explained that had surgery on Monday 5/12. Wednesday sat on  [facility] to get cleaned up and butt cheek spread leaving burning sensation like fire per patient. Leaking and drainage began to pick up after that incident . Progressively got worse over the next days. Saturating pads and clothes with serosanguineous drainage from incisional site. Patient expresses pain has increased more. Did follow up with DR on THursday assessed and appeared to be fine per MD. Wife stated edges are no longer touching as previous.

## 2012-06-19 ENCOUNTER — Telehealth (INDEPENDENT_AMBULATORY_CARE_PROVIDER_SITE_OTHER): Payer: Self-pay | Admitting: *Deleted

## 2012-06-19 NOTE — Telephone Encounter (Signed)
Wife called to request refill of pain medication.  Patient agreeable to Norco 5/325mg  1 tablet every 4-6 hours as needed for pain. #30 no refills. [facility] [phone]

## 2012-06-20 ENCOUNTER — Encounter (INDEPENDENT_AMBULATORY_CARE_PROVIDER_SITE_OTHER): Payer: Self-pay | Admitting: Surgery

## 2012-06-20 ENCOUNTER — Ambulatory Visit (INDEPENDENT_AMBULATORY_CARE_PROVIDER_SITE_OTHER): Payer: 59 | Admitting: Surgery

## 2012-06-20 VITALS — BP 120/75 | HR 78 | Temp 97.7°F | Resp 14 | Ht 67.0 in | Wt 191.0 lb

## 2012-06-20 DIAGNOSIS — Z9889 Other specified postprocedural states: Secondary | ICD-10-CM

## 2012-06-20 MED ORDER — OXYCODONE-ACETAMINOPHEN 5-325 MG PO TABS: 1.0000 | ORAL_TABLET | ORAL | Status: DC | PRN

## 2012-06-20 NOTE — Patient Instructions (Signed)
Pack wound with dry gauze and change three times a day and as needed.  Ok to shower.  The drainage should decrease over time.  Take your pain medicine.

## 2012-06-20 NOTE — Progress Notes (Signed)
Patient returns 8 days after abdominoperineal resection for cancer by Dr. Maisie Fus. He was seen at the weekend due to drainage from his perineal wound and this was opened by Dr. Johna Sheriff. He has some concerns about wound and wants to be checked.  The drainage is less.   Exam:  Perineum is open but clean with good granulation tissue.  Serous drainage.  No signs of infection.   Impression: S/P APR  With perineal wound dehiscence.    Pt reassured.  Cont wound care.  Return 1 week.

## 2012-06-27 ENCOUNTER — Telehealth (INDEPENDENT_AMBULATORY_CARE_PROVIDER_SITE_OTHER): Payer: Self-pay | Admitting: *Deleted

## 2012-06-27 ENCOUNTER — Encounter (INDEPENDENT_AMBULATORY_CARE_PROVIDER_SITE_OTHER): Payer: Self-pay | Admitting: Surgery

## 2012-06-27 ENCOUNTER — Ambulatory Visit (INDEPENDENT_AMBULATORY_CARE_PROVIDER_SITE_OTHER): Payer: 59 | Admitting: Surgery

## 2012-06-27 VITALS — BP 86/56 | HR 48 | Temp 97.0°F | Ht 67.0 in | Wt 186.6 lb

## 2012-06-27 DIAGNOSIS — Z9889 Other specified postprocedural states: Secondary | ICD-10-CM

## 2012-06-27 DIAGNOSIS — O901 Disruption of perineal obstetric wound: Secondary | ICD-10-CM

## 2012-06-27 DIAGNOSIS — T8130XA Disruption of wound, unspecified, initial encounter: Secondary | ICD-10-CM

## 2012-06-27 MED ORDER — CIPROFLOXACIN HCL 500 MG PO TABS: 500.0000 mg | ORAL_TABLET | Freq: Two times a day (BID) | ORAL | Status: DC

## 2012-06-27 MED ORDER — HYDROMORPHONE HCL 2 MG PO TABS
2.0000 mg | ORAL_TABLET | Freq: Two times a day (BID) | ORAL | Status: DC | PRN
Start: 2012-06-27 — End: 2012-06-29

## 2012-06-27 NOTE — Progress Notes (Signed)
NAME: Cody Coleman       DOB: 06-Nov-1945           DATE: 06/27/2012       WUJ:[id number]  CC:  Chief Complaint  Patient presents with  . Other    urge office/reck incision and tissue    HPI: postop abdominal perineal with continued low pelvic pain and more purulent drainage from the perineal area. He is not have any general abdominal pain, tolerating diet, and his colostomy is working fine. He is not had fevers at home.  EXAM: Vital signs: BP 86/56  Pulse 48  Temp(Src) 97 F (36.1 C) (Temporal)  Ht 5\' 7"  (1.702 m)  Wt 186 lb 9.6 oz (84.641 kg)  BMI 29.22 kg/m2  SpO2 95%  General: Patient alert, oriented, NAD  Abdomen: Soft and benign. All incisions healed. Colostomy is healthy and working fine  Perineum: The wound is open with some shaggy granulation tissue. The dressings are saturated with purulent material. I probed the area and there is a pocket above the subcutaneous closing sutures are removed some of those, obtained a culture, and placed some iodoform gauze wicks up.  IMP: probable infected pelvic collection, spontaneously draining  PLAN: I am going toempirically start him on some Cipro and gave him an Rx for dilaudid as the oxycodone is not working well  [employer] 06/27/2012

## 2012-06-27 NOTE — Telephone Encounter (Signed)
Carol with [facility] called to give [place] an update after she saw patient this am. She states she changed his dressing and he had a lot of bloody/pus drainage from wound. He was having a lot of sharp pains and is currently having moderate-heavy drainage. The proximal wound is approximately 7 cm deep. I advised that he has an appt to be seen this pm and I would pass the message along to MD. Okey Regal can be reached at [phone] with any questions.

## 2012-06-27 NOTE — Patient Instructions (Signed)
Leave the "wick" dressing in when you change the dressing. Start on antibiotic today Change pain med to Dilaudid See Dr Maisie Fus on Friday

## 2012-06-27 NOTE — Telephone Encounter (Signed)
Wife called to state that the tissue around his rectal area has changed in color and become very swollen.  They have an appt Friday with Dr. Maisie Fus however wife is very concerned.  Due to wifes concern appt for urgent office made.

## 2012-06-28 ENCOUNTER — Telehealth (INDEPENDENT_AMBULATORY_CARE_PROVIDER_SITE_OTHER): Payer: Self-pay

## 2012-06-28 NOTE — Telephone Encounter (Signed)
Pts wife called to verify wd care. Will keep appt tomorrow with Dr Maisie Fus.

## 2012-06-29 ENCOUNTER — Encounter (INDEPENDENT_AMBULATORY_CARE_PROVIDER_SITE_OTHER): Payer: Self-pay | Admitting: General Surgery

## 2012-06-29 ENCOUNTER — Ambulatory Visit (INDEPENDENT_AMBULATORY_CARE_PROVIDER_SITE_OTHER): Payer: 59 | Admitting: General Surgery

## 2012-06-29 VITALS — BP 100/60 | HR 48 | Temp 97.6°F | Resp 18 | Wt 188.4 lb

## 2012-06-29 DIAGNOSIS — Z9889 Other specified postprocedural states: Secondary | ICD-10-CM

## 2012-06-29 MED ORDER — HYDROMORPHONE HCL 2 MG PO TABS: 2.0000 mg | ORAL_TABLET | Freq: Four times a day (QID) | ORAL | Status: DC | PRN

## 2012-06-29 NOTE — Progress Notes (Signed)
Cody Coleman is a 67 y.o. male who is status post a APR on 5/12.  Since his discharge, he has had pain from his perineal wound.  This is accompanied by quite a bit of drainage.  He was seen by my partner and the wound was opened slightly and the drainage was cultured.  His pain was better for about 24 hrs after this but is now getting worse today.  He denies fevers.  He has a good appetite and his ostomy is working well.    Objective: Filed Vitals:   06/29/12 1332  BP: 100/60  Pulse: 48  Temp: 97.6 F (36.4 C)  Resp: 18    General appearance: alert and cooperative GI: soft, non-tender; bowel sounds normal; no masses,  no organomegaly ostomy: pink, viable  Incision: dehiscence present posteriorly, purulent drainage noted, posterior cavity noted 3-4 cm into the wound.    Penrose drain inserted into cavity and sutured into place.    Assessment: s/p  Patient Active Problem List   Diagnosis Date Noted  . Rectal cancer 05/03/2012  . High cholesterol 04/12/2012  . CAD (coronary artery disease) of artery bypass graft 04/12/2012    Plan: Continue Cipro.  Awaiting wound cultures.  Continue packing.  Will try sitz baths for pain.  Refilled Dilaudid Rx.  Return to office in 1 week.  Call the office if he develops fevers or inability to tolerate PO.    Marland KitchenVanita Panda, MD [facility], [place] [phone]   06/29/2012 5:12 PM

## 2012-06-29 NOTE — Patient Instructions (Signed)
Use a sitz bath TID and as needed for pain.    Pack wound at least twice daily  Continue antibiotics  Ok to use pain meds every 6 hrs as needed (1/2 to 1 tablet)  Hold blood pressure medications for blood pressures < 120

## 2012-07-02 ENCOUNTER — Encounter (HOSPITAL_COMMUNITY): Payer: Self-pay | Admitting: Oncology

## 2012-07-02 ENCOUNTER — Encounter (INDEPENDENT_AMBULATORY_CARE_PROVIDER_SITE_OTHER): Payer: Self-pay

## 2012-07-02 ENCOUNTER — Encounter (HOSPITAL_COMMUNITY): Payer: 59 | Attending: Oncology | Admitting: Oncology

## 2012-07-02 VITALS — BP 94/63 | HR 43 | Temp 98.0°F | Resp 16 | Wt 192.4 lb

## 2012-07-02 DIAGNOSIS — C20 Malignant neoplasm of rectum: Secondary | ICD-10-CM | POA: Diagnosis not present

## 2012-07-02 NOTE — Patient Instructions (Addendum)
[facility] Discharge Instructions  RECOMMENDATIONS MADE BY THE CONSULTANT AND ANY TEST RESULTS WILL BE SENT TO YOUR REFERRING PHYSICIAN.  EXAM FINDINGS BY THE PHYSICIAN TODAY AND SIGNS OR SYMPTOMS TO REPORT TO CLINIC OR PRIMARY PHYSICIAN: Discussion by MD.  Bonita Quin do not need chemotherapy or radiation therapy.  We just need to check your CEA every 3 months.  MEDICATIONS PRESCRIBED:  none  INSTRUCTIONS GIVEN AND DISCUSSED: Report changes in bowel movements, blood in your stool or other problems.  SPECIAL INSTRUCTIONS/FOLLOW-UP: Blood work in July and every 3 months and to see PA in 6 months.  Thank you for choosing [facility] to provide your oncology and hematology care.  To afford each patient quality time with our providers, please arrive at least 15 minutes before your scheduled appointment time.  With your help, our goal is to use those 15 minutes to complete the necessary work-up to ensure our physicians have the information they need to help with your evaluation and healthcare recommendations.    Effective January 1st, 2014, we ask that you re-schedule your appointment with our physicians should you arrive 10 or more minutes late for your appointment.  We strive to give you quality time with our providers, and arriving late affects you and other patients whose appointments are after yours.    Again, thank you for choosing [facility].  Our hope is that these requests will decrease the amount of time that you wait before being seen by our physicians.       _____________________________________________________________  Should you have questions after your visit to [facility], please contact our office at [phone] between the hours of 8:30 a.m. and 5:00 p.m.  Voicemails left after 4:30 p.m. will not be returned until the following business day.  For prescription refill requests, have your pharmacy contact our office with your  prescription refill request.

## 2012-07-02 NOTE — Progress Notes (Signed)
Rectal cancer, (T3 N0) status post resection on 06/11/2012. 24 lymph nodes were negative. This is a 3 cm cancer. All margins were negative. He therefore has stage IIA disease. He is recuperating well from his surgery.  He did have dehiscence and a small infection but is getting better.  We did present him at cancer conference and indeed he has no need for adjuvant chemotherapy or radiation therapy at this juncture with stage IIA disease.  We therefore need to follow him along with CEAs every 3 months. A CT scan of the abdomen pelvis once year for the next 2 or 3 years is also reasonable but not absolutely necessary.  He has great color today pain is much improved and we will see him in 6 months but start CEAs in July. He had his first CEA in April. They are fine with this plan. He'll call us if there are any issues.

## 2012-07-04 NOTE — Progress Notes (Signed)
Patient had APR 1 month ago. Next colonoscopy in one year.

## 2012-07-04 NOTE — Progress Notes (Signed)
1 year colonoscopy noted for March 2015

## 2012-07-05 ENCOUNTER — Telehealth (INDEPENDENT_AMBULATORY_CARE_PROVIDER_SITE_OTHER): Payer: Self-pay

## 2012-07-05 ENCOUNTER — Other Ambulatory Visit (INDEPENDENT_AMBULATORY_CARE_PROVIDER_SITE_OTHER): Payer: Self-pay

## 2012-07-05 DIAGNOSIS — R300 Dysuria: Secondary | ICD-10-CM

## 2012-07-05 NOTE — Telephone Encounter (Signed)
The wife called for the patient and reports he has burning and urgency of urination.  He will empty his bladder and then feel like he has to go again but only has a drop come out.  The patient states he doesn't want to go out anywhere today.  He has been through a lot.   I paged Dr Maisie Fus.  She ordered a ua with reflex culture and he can have it done before he comes out tomorrow for ov.  I notified the pt and will place the order for him to go tomorrow.  I advised him to drink plenty of liquids and cranberry juice.

## 2012-07-06 ENCOUNTER — Encounter (INDEPENDENT_AMBULATORY_CARE_PROVIDER_SITE_OTHER): Payer: Self-pay | Admitting: General Surgery

## 2012-07-06 ENCOUNTER — Encounter (INDEPENDENT_AMBULATORY_CARE_PROVIDER_SITE_OTHER): Payer: Self-pay

## 2012-07-06 ENCOUNTER — Ambulatory Visit (INDEPENDENT_AMBULATORY_CARE_PROVIDER_SITE_OTHER): Payer: 59 | Admitting: General Surgery

## 2012-07-06 VITALS — BP 120/80 | HR 72 | Temp 97.0°F | Resp 12 | Wt 191.4 lb

## 2012-07-06 DIAGNOSIS — Z9889 Other specified postprocedural states: Secondary | ICD-10-CM

## 2012-07-06 LAB — URINALYSIS, ROUTINE W REFLEX MICROSCOPIC
Bilirubin Urine: NEGATIVE
Nitrite: NEGATIVE
Protein, ur: NEGATIVE mg/dL
Specific Gravity, Urine: 1.017 (ref 1.005–1.030)
Urobilinogen, UA: 0.2 mg/dL (ref 0.0–1.0)

## 2012-07-06 NOTE — Progress Notes (Signed)
Cody Coleman is a 67 y.o. male who is status post a APR on 5/12. Since his discharge, he has had pain from his perineal wound.  I placed a penrose drain in a posterior cavity last week and this fell out on Mon after draining well.  He is having a lot less pain.  He is having some dysuria. Objective:  Filed Vitals:   07/06/12 1347  BP: 120/80  Pulse: 72  Temp: 97 F (36.1 C)  Resp: 12   General appearance: alert and cooperative  GI: soft, non-tender; bowel sounds normal; no masses, no organomegaly  ostomy: pink, viable  Incision: dehiscence present posteriorly, purulent drainage noted, posterior cavity noted 3-4 cm into the wound.  Penrose drain inserted into cavity and sutured into place.  Assessment:  s/p  Patient Active Problem List    Diagnosis  Date Noted   .  Rectal cancer  05/03/2012   .  High cholesterol  04/12/2012   .  CAD (coronary artery disease) of artery bypass graft  04/12/2012   Plan:  Continue Cipro for complete course.  Continue packing. Pt weaning pain meds on his own.  F/U in 2 weeks.

## 2012-07-06 NOTE — Patient Instructions (Signed)
Continue packing wound.  Return to office in 2 weeks.  Ok to travel.  Will fax note to disability company.

## 2012-07-09 ENCOUNTER — Telehealth (INDEPENDENT_AMBULATORY_CARE_PROVIDER_SITE_OTHER): Payer: Self-pay

## 2012-07-09 ENCOUNTER — Encounter (INDEPENDENT_AMBULATORY_CARE_PROVIDER_SITE_OTHER): Payer: Self-pay

## 2012-07-09 NOTE — Progress Notes (Signed)
LATE ENTRY:  I faxed a letter 07/06/12 to Wynn Banker with the patient's disability company to give the ok for him to travel to his 2nd home.  Fax # [phone].  Confirmation received.

## 2012-07-09 NOTE — Telephone Encounter (Signed)
The patient's wife called in reporting the wound is open a little more and has pus and odor.  He is in a little more discomfort.  She wants to know if we can call in more Cipro.  I spoke to Dr Luisa Hart and he authorized what they got before.  I will call in Cipro 500mg  po bid # 20 to [facility] [phone].  I told the wife can flush the wound with normal saline to keep clean.

## 2012-07-10 ENCOUNTER — Encounter (INDEPENDENT_AMBULATORY_CARE_PROVIDER_SITE_OTHER): Payer: Self-pay

## 2012-07-10 ENCOUNTER — Telehealth (INDEPENDENT_AMBULATORY_CARE_PROVIDER_SITE_OTHER): Payer: Self-pay | Admitting: General Surgery

## 2012-07-10 NOTE — Telephone Encounter (Signed)
Called patient to let him know that Dr Maisie Fus wants patient to continuing to do sitz baths

## 2012-07-10 NOTE — Progress Notes (Signed)
Pt's wife arrived today regarding would culture.  The original culture was done on 06/20/12 and lab lost specimen.  Do you want another would culture sent?  Deanna

## 2012-07-10 NOTE — Telephone Encounter (Signed)
Please make sure patient is continuing to do sitz baths

## 2012-07-19 ENCOUNTER — Other Ambulatory Visit (INDEPENDENT_AMBULATORY_CARE_PROVIDER_SITE_OTHER): Payer: 59

## 2012-07-19 DIAGNOSIS — I25810 Atherosclerosis of coronary artery bypass graft(s) without angina pectoris: Secondary | ICD-10-CM

## 2012-07-19 LAB — HEPATIC FUNCTION PANEL
AST: 22 U/L (ref 0–37)
Alkaline Phosphatase: 61 U/L (ref 39–117)
Bilirubin, Direct: 0.1 mg/dL (ref 0.0–0.3)
Total Protein: 7.8 g/dL (ref 6.0–8.3)

## 2012-07-19 LAB — LIPID PANEL
Cholesterol: 126 mg/dL (ref 0–200)
LDL Cholesterol: 63 mg/dL (ref 0–99)

## 2012-07-20 ENCOUNTER — Encounter (INDEPENDENT_AMBULATORY_CARE_PROVIDER_SITE_OTHER): Payer: Self-pay | Admitting: General Surgery

## 2012-07-20 ENCOUNTER — Ambulatory Visit (INDEPENDENT_AMBULATORY_CARE_PROVIDER_SITE_OTHER): Payer: 59 | Admitting: General Surgery

## 2012-07-20 VITALS — BP 122/74 | HR 78 | Temp 97.6°F | Resp 16 | Ht 67.0 in | Wt 191.6 lb

## 2012-07-20 DIAGNOSIS — Z9889 Other specified postprocedural states: Secondary | ICD-10-CM

## 2012-07-20 NOTE — Patient Instructions (Signed)
Continue wound packing.  Return to office in 2 wks

## 2012-07-20 NOTE — Progress Notes (Signed)
Cody Coleman is a 67 y.o. male who is status post a APR on 5/12. Since his discharge, he had some pain from his perineal wound. I placed a penrose drain in a posterior cavity, and this helped quite a bit. He is having a lot less pain and is off narcotics. The drainage changes from day to day.   Objective:  Filed Vitals:   07/20/12 1409  BP: 122/74  Pulse: 78  Temp: 97.6 F (36.4 C)  Resp: 16   General appearance: alert and cooperative  GI: soft, non-tender; bowel sounds normal; no masses, no organomegaly  ostomy: pink, viable  Incision: dehiscence present posteriorly with good granulation tissue  Assessment:  s/p  Patient Active Problem List    Diagnosis  Date Noted   .  Rectal cancer  05/03/2012   .  High cholesterol  04/12/2012   .  CAD (coronary artery disease) of artery bypass graft  04/12/2012   Plan:  Continue packing. Ok to drive. Advance diet as tolerated. F/U in 2 weeks.

## 2012-07-26 ENCOUNTER — Other Ambulatory Visit: Payer: 59

## 2012-08-06 ENCOUNTER — Encounter (INDEPENDENT_AMBULATORY_CARE_PROVIDER_SITE_OTHER): Payer: 59 | Admitting: General Surgery

## 2012-08-14 ENCOUNTER — Ambulatory Visit (INDEPENDENT_AMBULATORY_CARE_PROVIDER_SITE_OTHER): Payer: 59 | Admitting: General Surgery

## 2012-08-14 ENCOUNTER — Encounter (INDEPENDENT_AMBULATORY_CARE_PROVIDER_SITE_OTHER): Payer: Self-pay | Admitting: General Surgery

## 2012-08-14 VITALS — BP 130/80 | HR 68 | Temp 97.6°F | Resp 16 | Ht 67.0 in | Wt 195.2 lb

## 2012-08-14 DIAGNOSIS — C20 Malignant neoplasm of rectum: Secondary | ICD-10-CM

## 2012-08-14 NOTE — Progress Notes (Signed)
Cody Coleman is a 67 y.o. male who is status post a APR on 5/12. Since his discharge, he had some pain from his perineal wound. I placed a penrose drain in a posterior cavity, and this helped quite a bit. He is having a lot less pain and is off narcotics.  He is still packing the wound.  Still having trouble sitting.  Unable to walk long distances.  No trouble with the ostomy.  Eating well. Objective:  Filed Vitals:   08/14/12 1157  BP: 130/80  Pulse: 68  Temp: 97.6 F (36.4 C)  Resp: 16   General appearance: alert and cooperative  GI: soft, non-tender; bowel sounds normal; no masses, no organomegaly  ostomy: pink, viable  Incision: dehiscence present posteriorly with good granulation tissue, admits the head of a q tip   Assessment:  s/p  Patient Active Problem List    Diagnosis  Date Noted   .  Rectal cancer  05/03/2012   .  High cholesterol  04/12/2012   .  CAD (coronary artery disease) of artery bypass graft  04/12/2012   Plan:  Continue packing wound.  F/U in 4 weeks.  Will make decisions about return to work at that time.

## 2012-08-14 NOTE — Patient Instructions (Signed)
Keep packing wound as needed

## 2012-08-28 ENCOUNTER — Telehealth (INDEPENDENT_AMBULATORY_CARE_PROVIDER_SITE_OTHER): Payer: Self-pay | Admitting: *Deleted

## 2012-08-28 NOTE — Telephone Encounter (Signed)
Patient's wife called to state that patient ate a little too much salad and became constipated this past weekend.  She states they gave the patient Miralax and he had 2 large soft stools.  Wife states patient denies abdominal pain or distention at this time.  Wife states patient is passing gas but has had a small amount of output since yesterday however wife also states patient has not been eating much either.  This RN stated that if there was no abdominal pain, no distention and patient is passing gas then it seems like everything is fine.  Suggested that due to patient not having much intake then he would not have as much output.  Explained to wife that if patient begins having symptoms then to call us back but at this time it sounds like everything is normal.  Wife states understanding and agreeable at this time.

## 2012-08-30 ENCOUNTER — Telehealth (INDEPENDENT_AMBULATORY_CARE_PROVIDER_SITE_OTHER): Payer: Self-pay | Admitting: *Deleted

## 2012-08-30 NOTE — Telephone Encounter (Signed)
Wife updated at this time with plan of care.  Wife states understanding and agreeable with plan at this time.  Wife will call tomorrow if no output, patient may need to be seen in clinic per below message.

## 2012-08-30 NOTE — Telephone Encounter (Signed)
Would try a dose of Mag Citrate every 12 h or so until BM.  Switch to liquid diet until BM.  Call the office tomorrow if no better.  May need to come in and have ostomy irrigated

## 2012-08-30 NOTE — Telephone Encounter (Signed)
Patient's wife called this morning to state that patient has not had any output since Wednesday of last week.  Wife states patient is still passing gas, no abdominal distention, good bowel sounds, but is starting to feel tight.  Wife states they have tried Miralax and 2 Glycerin suppositories without effect.  When speaking to wife regarding message from earlier this week she states that all occurred last week not this week.  Wife states patient ate too much roughage last weekend and they did the Miralax at that time which produced 2 large soft stools last Wednesday.  Wife concerned that patient could be getting backed up since no output in his bag since last Wednesday.  Explained to wife that a message will be sent to Dr. Maisie Fus to ask for her suggestion.  Wife states understanding and agreeable with the plan at this time.

## 2012-08-31 NOTE — Telephone Encounter (Signed)
Patient called back this morning to update [place] that he tried the mag citrate and it worked very well.  Patient states multiple changes of his bag yesterday and last night.  Patient denies abdominal pain, bloating, and any other symptoms at this time.  Patient states he feels perfectly fine.  Patient aware of his appt this coming Thursday and states he will see [place] then.  Patient also aware he may begin advancing his diet as tolerated.  Patient agreeable with plan at this time and states understanding.

## 2012-09-05 ENCOUNTER — Other Ambulatory Visit: Payer: Self-pay

## 2012-09-06 ENCOUNTER — Encounter (INDEPENDENT_AMBULATORY_CARE_PROVIDER_SITE_OTHER): Payer: Self-pay

## 2012-09-06 ENCOUNTER — Ambulatory Visit (INDEPENDENT_AMBULATORY_CARE_PROVIDER_SITE_OTHER): Payer: 59 | Admitting: General Surgery

## 2012-09-06 ENCOUNTER — Encounter (INDEPENDENT_AMBULATORY_CARE_PROVIDER_SITE_OTHER): Payer: Self-pay | Admitting: General Surgery

## 2012-09-06 ENCOUNTER — Other Ambulatory Visit (INDEPENDENT_AMBULATORY_CARE_PROVIDER_SITE_OTHER): Payer: Self-pay

## 2012-09-06 VITALS — BP 118/68 | HR 78 | Temp 98.3°F | Resp 15 | Ht 67.0 in | Wt 190.4 lb

## 2012-09-06 DIAGNOSIS — C20 Malignant neoplasm of rectum: Secondary | ICD-10-CM

## 2012-09-06 NOTE — Patient Instructions (Addendum)
Return to office in 3 months.  We will get a CEA level now and every 3 months.  Ok to return to work.

## 2012-09-06 NOTE — Progress Notes (Signed)
FREEDOM PEDDY is a 67 y.o. male who is status post a APR on 06/11/12.  He is doing well.  His perineal wound is almost healed.  He had some difficulty with ostomy output after eating a lot of salad, but this resolved with some Mag citrate.  He is ready to return to work.  Objective: Filed Vitals:   09/06/12 1512  BP: 118/68  Pulse: 78  Temp: 98.3 F (36.8 C)  Resp: 15    General appearance: alert and cooperative Resp: clear to auscultation bilaterally Cardio: regular rate and rhythm GI: soft, non-tender; bowel sounds normal; no masses,  no organomegaly  Incision: healing well   Assessment: s/p  Patient Active Problem List   Diagnosis Date Noted  . Rectal cancer 05/03/2012  . High cholesterol 04/12/2012  . CAD (coronary artery disease) of artery bypass graft 04/12/2012    Plan: 3 mo s/p APR.  Will check CEA.  RTO in 3 months for follow up.  Ok to return to work.      Vanita Panda, MD [facility], [place] [phone]   09/06/2012 3:59 PM

## 2012-09-07 ENCOUNTER — Telehealth (INDEPENDENT_AMBULATORY_CARE_PROVIDER_SITE_OTHER): Payer: Self-pay

## 2012-09-07 NOTE — Telephone Encounter (Signed)
Message copied by Ivory Broad on Fri Sep 07, 2012 11:01 AM ------      Message from: [place], Broadus John.      Created: Fri Sep 07, 2012  7:12 AM       Please let them know CEA looks good.            AT      ----- Message -----         From: [facility]         Sent: 09/07/2012   2:56 AM           To: Romie Levee, MD                   ------

## 2012-09-07 NOTE — Telephone Encounter (Signed)
I notified the pt of his CEA level and that it looks good.

## 2012-11-09 ENCOUNTER — Other Ambulatory Visit (HOSPITAL_COMMUNITY): Payer: 59

## 2012-11-28 ENCOUNTER — Other Ambulatory Visit: Payer: Self-pay | Admitting: Cardiology

## 2012-12-06 ENCOUNTER — Other Ambulatory Visit: Payer: Self-pay

## 2012-12-10 ENCOUNTER — Other Ambulatory Visit (INDEPENDENT_AMBULATORY_CARE_PROVIDER_SITE_OTHER): Payer: Self-pay

## 2012-12-10 ENCOUNTER — Ambulatory Visit (INDEPENDENT_AMBULATORY_CARE_PROVIDER_SITE_OTHER): Payer: 59 | Admitting: General Surgery

## 2012-12-10 ENCOUNTER — Encounter (INDEPENDENT_AMBULATORY_CARE_PROVIDER_SITE_OTHER): Payer: Self-pay | Admitting: General Surgery

## 2012-12-10 VITALS — BP 170/110 | HR 96 | Temp 98.6°F | Resp 15 | Ht 67.0 in | Wt 202.0 lb

## 2012-12-10 DIAGNOSIS — C20 Malignant neoplasm of rectum: Secondary | ICD-10-CM

## 2012-12-10 NOTE — Patient Instructions (Signed)
Contact Page Spiro at [facility] medical supply about you stomal irritation.  Call the office if her suggestions are not working.  Consider wearing an ostomy belt.  We will get another CEA in 3 months.

## 2012-12-10 NOTE — Progress Notes (Signed)
Cody Coleman is a 67 y.o. male who is here for a follow up visit regarding rectal cancer.  He is s/p APR on 06/11/12.  He did not receive any additional chemotherapy treatment for his stage 2 cancer.    Objective: Filed Vitals:   12/10/12 1108  BP: 170/110  Pulse: 96  Temp: 98.6 F (37 C)  Resp: 15  Recheck BP: 140/100  General appearance: alert and cooperative Resp: clear to auscultation bilaterally Cardio: regular rate and rhythm GI: normal findings: soft, non-tender ostomy: peristomal hernia present, traction polyp on colonic mucosa, good pouch seal, slight erythema around pouch, rash noted along left abd sidewall  PATH:  Colon, segmental resection for tumor - INVASIVE WELL DIFFERENTIATED ADENOCARCINOMA WITH EXTRACELLULAR MUCIN, INVADING INTO THE MUSCULARIS PROPRIA AND FOCALLY INTO THE PERICOLONIC FATTY TISSUE. - TWENTY-FOUR PERICOLONIC LYMPH NODES, NEGATIVE FOR METASTATIC CARCINOMA (0/24) - RESECTION MARGIN NEGATIVE FOR MALIGNANCY. pT3, pN0, pMX  Lab Results  Component Value Date   CEA 3.5 12/04/2012  8/14: 2.9 4/14: 2.7  Assessment and Plan: Cody Coleman is a 67 y.o. that is 6 mo s/p APR.  He does have an asymptomatic peristomal hernia and some peristomal skin irritation.  I have asked him to try the ostomy belt and to see the ostomy RN at [facility].  His CEA is up slightly.  He has no other symptoms and therefore we will watch this for now and draw more labs in 3 months.  I have asked him to see his PCP for his weight gain and HTN.  He currently does not have an oncologist since Dr Jerelyn Scott left.    Vanita Panda, MD [facility] Surgery, [place] [phone]

## 2013-01-08 ENCOUNTER — Encounter (INDEPENDENT_AMBULATORY_CARE_PROVIDER_SITE_OTHER): Payer: Self-pay | Admitting: General Surgery

## 2013-01-08 ENCOUNTER — Ambulatory Visit (INDEPENDENT_AMBULATORY_CARE_PROVIDER_SITE_OTHER): Payer: 59 | Admitting: General Surgery

## 2013-01-08 ENCOUNTER — Telehealth (INDEPENDENT_AMBULATORY_CARE_PROVIDER_SITE_OTHER): Payer: Self-pay | Admitting: *Deleted

## 2013-01-08 VITALS — BP 150/100 | HR 72 | Temp 98.6°F | Resp 14 | Ht 67.0 in | Wt 201.8 lb

## 2013-01-08 DIAGNOSIS — K9403 Colostomy malfunction: Secondary | ICD-10-CM

## 2013-01-08 DIAGNOSIS — R21 Rash and other nonspecific skin eruption: Secondary | ICD-10-CM

## 2013-01-08 MED ORDER — HYDROCORTISONE 2.5 % EX LOTN
TOPICAL_LOTION | CUTANEOUS | Status: DC
Start: 2013-01-08 — End: 2013-05-31

## 2013-01-08 NOTE — Progress Notes (Signed)
Cody Coleman is a 67 y.o. male who is here for a follow up visit regarding his stoma.  He reports a persistent  rash around his stoma and itching around his wrists and ankles.  He has tried an antifungal powder with no relief.  He also reports some increased abrasions on his ostomy and some outlet problems with constipation.    Objective: Filed Vitals:   01/08/13 1037  BP: 150/100  Pulse: 72  Temp: 98.6 F (37 C)  Resp: 14    General appearance: alert and cooperative GI: normal findings: soft, non-tender Ostomy: abrasions were stomal appliance has been cut to small, slight peristomal rash  Assessment and Plan: Peristomal rash: Patient has tried antifungal powder with no resolution. I will prescribe him a steroid cream to see if this is an allergic reaction. If this is the case he may need to change his stomal products.  He appears to have a parastomal hernia. This appears to be widening his ostomy aperture. It is causing pouching difficulties. It also appears to be causing some obstructive symptoms as well. I will get a CT scan to evaluate this further. I think he may need peristomal hernia repair in the near future.    Vanita Panda, MD [facility] Surgery, [place] [phone]

## 2013-01-08 NOTE — Patient Instructions (Addendum)
I recommend a large ostomy opening.  Try a steroid cream for your rash.  Call us if you'd like to schedule surgery.

## 2013-01-08 NOTE — Telephone Encounter (Signed)
I spoke with pt and informed him of the appt for his CT scan at [facility] on 12/10 with an arrival time of 1:30pm.  Instructed him on when to drink contrast and to have no solid foods 4 hours prior to scan.  Also informed pt that they will be drawing his blood prior to the scan.  He is agreeable with this appt.

## 2013-01-09 ENCOUNTER — Other Ambulatory Visit (INDEPENDENT_AMBULATORY_CARE_PROVIDER_SITE_OTHER): Payer: Self-pay | Admitting: General Surgery

## 2013-01-09 ENCOUNTER — Telehealth (INDEPENDENT_AMBULATORY_CARE_PROVIDER_SITE_OTHER): Payer: Self-pay | Admitting: General Surgery

## 2013-01-09 ENCOUNTER — Telehealth (INDEPENDENT_AMBULATORY_CARE_PROVIDER_SITE_OTHER): Payer: Self-pay

## 2013-01-09 ENCOUNTER — Ambulatory Visit
Admission: RE | Admit: 2013-01-09 | Discharge: 2013-01-09 | Disposition: A | Discharge status: Home / Self Care | Payer: 59 | Source: Ambulatory Visit | Attending: General Surgery | Admitting: General Surgery

## 2013-01-09 DIAGNOSIS — K9403 Colostomy malfunction: Secondary | ICD-10-CM

## 2013-01-09 MED ORDER — IOHEXOL 300 MG/ML  SOLN
125.0000 mL | Freq: Once | INTRAMUSCULAR | Status: AC | PRN
  Administered 2013-01-09: 125 mL via INTRAVENOUS

## 2013-01-09 NOTE — Telephone Encounter (Signed)
Discuss CT results with patient. There appears to be a loop of: that is been brought up above the fascia and causing his ostomy dysfunction. I think we could correct his pouching problems with a colostomy revision surgery. I do not think he needs a parastomal hernia repair at this time. The patient is agreeable to this.

## 2013-01-09 NOTE — Telephone Encounter (Signed)
Patient called to state he wants to schedule surgery by the end of the month.  They are moving next month.  I told him Dr Maisie Fus wants to review his CT when final today 1st.  Then we will get him scheduled.

## 2013-01-11 ENCOUNTER — Encounter (HOSPITAL_COMMUNITY): Payer: Self-pay | Admitting: Pharmacy Technician

## 2013-01-11 ENCOUNTER — Telehealth (INDEPENDENT_AMBULATORY_CARE_PROVIDER_SITE_OTHER): Payer: Self-pay | Admitting: General Surgery

## 2013-01-11 ENCOUNTER — Ambulatory Visit (HOSPITAL_COMMUNITY): Payer: 59 | Admitting: Oncology

## 2013-01-11 NOTE — Telephone Encounter (Signed)
Needs post op appt 01/17/13 sx date  Thanks

## 2013-01-15 ENCOUNTER — Telehealth (INDEPENDENT_AMBULATORY_CARE_PROVIDER_SITE_OTHER): Payer: Self-pay | Admitting: *Deleted

## 2013-01-15 NOTE — Pre-Procedure Instructions (Signed)
Cody Coleman  01/15/2013   Your procedure is scheduled on:  Thursday, December 18th.  Report to [facility], Main Entrance Cody Coleman "A" at 5:30 AM.  Call this number if you have problems the morning of surgery: [phone]   Remember:   Do not eat food or drink liquids after midnight.   Take these medicines the morning of surgery with A SIP OF WATER: Tamsulosin (Flomax).     Take if needed: Alprazolam (Xanax).   Do not wear jewelry, make-up or nail polish.  Do not wear lotions, powders, or perfumes. You may wear deodorant.   Men may shave face and neck.  Do not bring valuables to the hospital.  [facility] is not responsible                  for any belongings or valuables.               Contacts, dentures or bridgework may not be worn into surgery.  Leave suitcase in the car. After surgery it may be brought to your room.  For patients admitted to the hospital, discharge time is determined by your                treatment team.                Special Instructions: Shower with CHG wash (Bactoshield) tonight and again in the am prior to arriving to hospital.   Please read over the following fact sheets that you were given: Pain Booklet, Coughing and Deep Breathing and Surgical Site Infection Prevention

## 2013-01-15 NOTE — Telephone Encounter (Signed)
Patient called to ask about his bowel prep for his colostomy revision on 01/17/13.  Checked with Dr. Maisie Fus who states that patient does not need a bowel prep.  Patient updated at this time and agreeable.

## 2013-01-16 ENCOUNTER — Encounter (HOSPITAL_COMMUNITY)
Admission: RE | Admit: 2013-01-16 | Discharge: 2013-01-16 | Disposition: A | Discharge status: Home / Self Care | Payer: 59 | Source: Ambulatory Visit | Attending: General Surgery | Admitting: General Surgery

## 2013-01-16 ENCOUNTER — Telehealth (INDEPENDENT_AMBULATORY_CARE_PROVIDER_SITE_OTHER): Payer: Self-pay | Admitting: *Deleted

## 2013-01-16 LAB — COMPREHENSIVE METABOLIC PANEL
ALT: 57 U/L — ABNORMAL HIGH (ref 0–53)
AST: 63 U/L — ABNORMAL HIGH (ref 0–37)
Calcium: 9 mg/dL (ref 8.4–10.5)
GFR calc Af Amer: >90 mL/min (ref >90)
Glucose, Bld: 109 mg/dL — ABNORMAL HIGH (ref 70–99)
Sodium: 140 mEq/L (ref 135–145)
Total Protein: 7.2 g/dL (ref 6.0–8.3)

## 2013-01-16 LAB — CBC
MCH: 33.5 pg (ref 26.0–34.0)
MCHC: 35.3 g/dL (ref 30.0–36.0)
Platelets: 138 10*3/uL — ABNORMAL LOW (ref 150–400)

## 2013-01-16 MED ORDER — DEXTROSE 5 % IV SOLN
2.0000 g | INTRAVENOUS | Status: AC
  Administered 2013-01-17: 2 g via INTRAVENOUS
  Filled 2013-01-16: qty 2

## 2013-01-16 NOTE — Telephone Encounter (Signed)
Spoke with patient to give him his PO appt on 02/05/13.  Patient agreeable at this time.

## 2013-01-17 ENCOUNTER — Ambulatory Visit (HOSPITAL_COMMUNITY): Payer: 59 | Admitting: Certified Registered Nurse Anesthetist

## 2013-01-17 ENCOUNTER — Encounter (HOSPITAL_COMMUNITY)
Admission: RE | Disposition: A | Discharge status: Home / Self Care | Payer: Self-pay | Source: Ambulatory Visit | Attending: General Surgery

## 2013-01-17 ENCOUNTER — Ambulatory Visit (HOSPITAL_COMMUNITY)
Admission: RE | Admit: 2013-01-17 | Discharge: 2013-01-17 | Disposition: A | Discharge status: Home / Self Care | Payer: 59 | Source: Ambulatory Visit | Attending: General Surgery | Admitting: General Surgery

## 2013-01-17 ENCOUNTER — Encounter (HOSPITAL_COMMUNITY): Payer: 59 | Admitting: Certified Registered Nurse Anesthetist

## 2013-01-17 ENCOUNTER — Encounter (HOSPITAL_COMMUNITY): Payer: Self-pay | Admitting: *Deleted

## 2013-01-17 DIAGNOSIS — Z79899 Other long term (current) drug therapy: Secondary | ICD-10-CM | POA: Insufficient documentation

## 2013-01-17 DIAGNOSIS — G479 Sleep disorder, unspecified: Secondary | ICD-10-CM | POA: Insufficient documentation

## 2013-01-17 DIAGNOSIS — I252 Old myocardial infarction: Secondary | ICD-10-CM | POA: Insufficient documentation

## 2013-01-17 DIAGNOSIS — I2510 Atherosclerotic heart disease of native coronary artery without angina pectoris: Secondary | ICD-10-CM | POA: Insufficient documentation

## 2013-01-17 DIAGNOSIS — IMO0002 Reserved for concepts with insufficient information to code with codable children: Secondary | ICD-10-CM

## 2013-01-17 DIAGNOSIS — Z87891 Personal history of nicotine dependence: Secondary | ICD-10-CM | POA: Insufficient documentation

## 2013-01-17 DIAGNOSIS — Y833 Surgical operation with formation of external stoma as the cause of abnormal reaction of the patient, or of later complication, without mention of misadventure at the time of the procedure: Secondary | ICD-10-CM | POA: Insufficient documentation

## 2013-01-17 DIAGNOSIS — Z85048 Personal history of other malignant neoplasm of rectum, rectosigmoid junction, and anus: Secondary | ICD-10-CM | POA: Insufficient documentation

## 2013-01-17 DIAGNOSIS — R3915 Urgency of urination: Secondary | ICD-10-CM | POA: Insufficient documentation

## 2013-01-17 DIAGNOSIS — I10 Essential (primary) hypertension: Secondary | ICD-10-CM | POA: Insufficient documentation

## 2013-01-17 DIAGNOSIS — M25569 Pain in unspecified knee: Secondary | ICD-10-CM | POA: Insufficient documentation

## 2013-01-17 DIAGNOSIS — E7800 Pure hypercholesterolemia, unspecified: Secondary | ICD-10-CM | POA: Insufficient documentation

## 2013-01-17 HISTORY — PX: COLOSTOMY REVISION: SHX5232

## 2013-01-17 SURGERY — REVISION, COLOSTOMY
Anesthesia: General

## 2013-01-17 MED ORDER — ONDANSETRON HCL 4 MG/2ML IJ SOLN
INTRAMUSCULAR | Status: DC | PRN
  Administered 2013-01-17: 4 mg via INTRAVENOUS

## 2013-01-17 MED ORDER — PHENYLEPHRINE HCL 10 MG/ML IJ SOLN
10.0000 mg | INTRAVENOUS | Status: DC | PRN
  Administered 2013-01-17: 30 ug/min via INTRAVENOUS

## 2013-01-17 MED ORDER — GLYCOPYRROLATE 0.2 MG/ML IJ SOLN
INTRAMUSCULAR | Status: DC | PRN
  Administered 2013-01-17: 0.4 mg via INTRAVENOUS

## 2013-01-17 MED ORDER — PROMETHAZINE HCL 25 MG/ML IJ SOLN: 6.2500 mg | INTRAMUSCULAR | Status: DC | PRN

## 2013-01-17 MED ORDER — HYDROMORPHONE HCL PF 1 MG/ML IJ SOLN: 0.2500 mg | INTRAMUSCULAR | Status: DC | PRN

## 2013-01-17 MED ORDER — MIDAZOLAM HCL 5 MG/5ML IJ SOLN
INTRAMUSCULAR | Status: DC | PRN
  Administered 2013-01-17: 2 mg via INTRAVENOUS

## 2013-01-17 MED ORDER — PHENYLEPHRINE HCL 10 MG/ML IJ SOLN
INTRAMUSCULAR | Status: DC | PRN
  Administered 2013-01-17 (×2): 80 ug via INTRAVENOUS
  Administered 2013-01-17: 120 ug via INTRAVENOUS
  Administered 2013-01-17 (×3): 80 ug via INTRAVENOUS

## 2013-01-17 MED ORDER — SUCCINYLCHOLINE CHLORIDE 20 MG/ML IJ SOLN
INTRAMUSCULAR | Status: DC | PRN
  Administered 2013-01-17: 100 mg via INTRAVENOUS

## 2013-01-17 MED ORDER — PROPOFOL 10 MG/ML IV BOLUS
INTRAVENOUS | Status: DC | PRN
  Administered 2013-01-17: 100 mg via INTRAVENOUS
  Administered 2013-01-17: 120 mg via INTRAVENOUS
  Administered 2013-01-17: 40 mg via INTRAVENOUS

## 2013-01-17 MED ORDER — OXYCODONE HCL 5 MG PO TABS: 5.0000 mg | ORAL_TABLET | Freq: Once | ORAL | Status: DC | PRN

## 2013-01-17 MED ORDER — ASPIRIN 325 MG PO TABS: 325.0000 mg | ORAL_TABLET | Freq: Every morning | ORAL | Status: DC

## 2013-01-17 MED ORDER — MIDAZOLAM HCL 2 MG/2ML IJ SOLN: 0.5000 mg | Freq: Once | INTRAMUSCULAR | Status: DC | PRN

## 2013-01-17 MED ORDER — 0.9 % SODIUM CHLORIDE (POUR BTL) OPTIME
TOPICAL | Status: DC | PRN
  Administered 2013-01-17: 1000 mL

## 2013-01-17 MED ORDER — ROCURONIUM BROMIDE 100 MG/10ML IV SOLN
INTRAVENOUS | Status: DC | PRN
  Administered 2013-01-17: 10 mg via INTRAVENOUS
  Administered 2013-01-17: 20 mg via INTRAVENOUS

## 2013-01-17 MED ORDER — EPHEDRINE SULFATE 50 MG/ML IJ SOLN
INTRAMUSCULAR | Status: DC | PRN
  Administered 2013-01-17: 5 mg via INTRAVENOUS
  Administered 2013-01-17 (×2): 10 mg via INTRAVENOUS

## 2013-01-17 MED ORDER — LIDOCAINE HCL (CARDIAC) 20 MG/ML IV SOLN
INTRAVENOUS | Status: DC | PRN
  Administered 2013-01-17: 60 mg via INTRAVENOUS

## 2013-01-17 MED ORDER — FENTANYL CITRATE 0.05 MG/ML IJ SOLN
INTRAMUSCULAR | Status: DC | PRN
  Administered 2013-01-17: 100 ug via INTRAVENOUS
  Administered 2013-01-17: 150 ug via INTRAVENOUS

## 2013-01-17 MED ORDER — MEPERIDINE HCL 25 MG/ML IJ SOLN: 6.2500 mg | INTRAMUSCULAR | Status: DC | PRN

## 2013-01-17 MED ORDER — METOPROLOL TARTRATE 1 MG/ML IV SOLN
INTRAVENOUS | Status: DC | PRN
  Administered 2013-01-17 (×2): 2 mg via INTRAVENOUS

## 2013-01-17 MED ORDER — NEOSTIGMINE METHYLSULFATE 1 MG/ML IJ SOLN
INTRAMUSCULAR | Status: DC | PRN
  Administered 2013-01-17: 3 mg via INTRAVENOUS

## 2013-01-17 MED ORDER — LACTATED RINGERS IV SOLN
INTRAVENOUS | Status: DC | PRN
  Administered 2013-01-17 (×3): via INTRAVENOUS

## 2013-01-17 MED ORDER — OXYCODONE HCL 5 MG/5ML PO SOLN: 5.0000 mg | Freq: Once | ORAL | Status: DC | PRN

## 2013-01-17 MED ORDER — OXYCODONE HCL 5 MG PO TABS: 5.0000 mg | ORAL_TABLET | Freq: Four times a day (QID) | ORAL | Status: DC | PRN

## 2013-01-17 SURGICAL SUPPLY — 53 items
BLADE SURG ROTATE 9660 (MISCELLANEOUS) IMPLANT
CANISTER SUCTION 2500CC (MISCELLANEOUS) ×2 IMPLANT
CHLORAPREP W/TINT 26ML (MISCELLANEOUS) ×2 IMPLANT
COVER MAYO STAND STRL (DRAPES) ×2 IMPLANT
COVER SURGICAL LIGHT HANDLE (MISCELLANEOUS) ×2 IMPLANT
DRAPE LAPAROSCOPIC ABDOMINAL (DRAPES) ×2 IMPLANT
DRAPE PROXIMA HALF (DRAPES) ×2 IMPLANT
DRAPE UTILITY 15X26 W/TAPE STR (DRAPE) ×7 IMPLANT
DRAPE WARM FLUID 44X44 (DRAPE) ×2 IMPLANT
DRSG OPSITE POSTOP 4X10 (GAUZE/BANDAGES/DRESSINGS) IMPLANT
DRSG OPSITE POSTOP 4X8 (GAUZE/BANDAGES/DRESSINGS) IMPLANT
ELECT BLADE 6.5 EXT (BLADE) ×1 IMPLANT
ELECT CAUTERY BLADE 6.4 (BLADE) ×3 IMPLANT
ELECT REM PT RETURN 9FT ADLT (ELECTROSURGICAL) ×2
ELECTRODE REM PT RTRN 9FT ADLT (ELECTROSURGICAL) ×1 IMPLANT
GLOVE BIO SURGEON STRL SZ 6.5 (GLOVE) ×3 IMPLANT
GLOVE BIO SURGEON STRL SZ7.5 (GLOVE) ×1 IMPLANT
GLOVE BIOGEL PI IND STRL 7.0 (GLOVE) ×2 IMPLANT
GLOVE BIOGEL PI IND STRL 7.5 (GLOVE) IMPLANT
GLOVE BIOGEL PI INDICATOR 7.0 (GLOVE) ×6
GLOVE BIOGEL PI INDICATOR 7.5 (GLOVE) ×1
GLOVE SURG SS PI 7.0 STRL IVOR (GLOVE) ×4 IMPLANT
GOWN PREVENTION PLUS XXLARGE (GOWN DISPOSABLE) ×3 IMPLANT
GOWN STRL NON-REIN LRG LVL3 (GOWN DISPOSABLE) ×9 IMPLANT
KIT BASIN OR (CUSTOM PROCEDURE TRAY) ×2 IMPLANT
KIT OSTOMY DRAINABLE 2.75 STR (WOUND CARE) ×1 IMPLANT
KIT ROOM TURNOVER OR (KITS) ×2 IMPLANT
LEGGING LITHOTOMY PAIR STRL (DRAPES) IMPLANT
LIGASURE IMPACT 36 18CM CVD LR (INSTRUMENTS) IMPLANT
NS IRRIG 1000ML POUR BTL (IV SOLUTION) ×4 IMPLANT
PACK GENERAL/GYN (CUSTOM PROCEDURE TRAY) ×2 IMPLANT
PAD ARMBOARD 7.5X6 YLW CONV (MISCELLANEOUS) ×2 IMPLANT
PENCIL BUTTON HOLSTER BLD 10FT (ELECTRODE) ×2 IMPLANT
SPECIMEN JAR MEDIUM (MISCELLANEOUS) IMPLANT
SPONGE LAP 18X18 X RAY DECT (DISPOSABLE) ×1 IMPLANT
STAPLER VISISTAT 35W (STAPLE) IMPLANT
SUCTION POOLE TIP (SUCTIONS) IMPLANT
SURGILUBE 2OZ TUBE FLIPTOP (MISCELLANEOUS) IMPLANT
SUT ETHIBOND NAB CT1 #1 30IN (SUTURE) ×4 IMPLANT
SUT PDS AB 1 TP1 96 (SUTURE) IMPLANT
SUT PROLENE 0 CT 2 (SUTURE) ×4 IMPLANT
SUT SILK 2 0 SH CR/8 (SUTURE) ×2 IMPLANT
SUT SILK 2 0 TIES 10X30 (SUTURE) ×2 IMPLANT
SUT SILK 3 0 SH CR/8 (SUTURE) ×2 IMPLANT
SUT SILK 3 0 TIES 10X30 (SUTURE) ×2 IMPLANT
SUT VIC AB 2-0 SH 18 (SUTURE) ×4 IMPLANT
SUT VIC AB 3-0 SH 18 (SUTURE) ×2 IMPLANT
SYR BULB IRRIGATION 50ML (SYRINGE) ×2 IMPLANT
TOWEL OR 17X26 10 PK STRL BLUE (TOWEL DISPOSABLE) ×4 IMPLANT
TRAY FOLEY CATH 14FRSI W/METER (CATHETERS) IMPLANT
TUBE CONNECTING 12X1/4 (SUCTIONS) ×2 IMPLANT
WATER STERILE IRR 1000ML POUR (IV SOLUTION) IMPLANT
YANKAUER SUCT BULB TIP NO VENT (SUCTIONS) IMPLANT

## 2013-01-17 NOTE — Anesthesia Procedure Notes (Signed)
Procedure Name: Intubation Date/Time: 01/17/2013 8:13 AM Performed by: Jerilee Hoh Pre-anesthesia Checklist: Patient identified, Emergency Drugs available, Suction available and Patient being monitored Patient Re-evaluated:Patient Re-evaluated prior to inductionOxygen Delivery Method: Circle system utilized Preoxygenation: Pre-oxygenation with 100% oxygen Intubation Type: IV induction Ventilation: Oral airway inserted - appropriate to patient size and Mask ventilation with difficulty Laryngoscope Size: Mac and 4 Grade View: Grade II Tube type: Oral Tube size: 7.5 mm Number of attempts: 1 Airway Equipment and Method: Stylet Placement Confirmation: ETT inserted through vocal cords under direct vision,  positive ETCO2 and breath sounds checked- equal and bilateral Secured at: 22 cm Tube secured with: Tape Dental Injury: Teeth and Oropharynx as per pre-operative assessment  Comments: Smooth IV induction. Difficult mask d/t beard. Anectine used and 2 handed mask ventilation with oral airway. Atraumatic intubation.

## 2013-01-17 NOTE — Op Note (Signed)
01/17/2013  10:30 AM  PATIENT:  Cody Coleman  67 y.o. male  Patient Care Team: Karleen Hampshire, MD as PCP - General (Family Medicine)  PRE-OPERATIVE DIAGNOSIS:  colostomy dysfunction   POST-OPERATIVE DIAGNOSIS:  colostomy dysfunction   PROCEDURE:  COLOSTOMY REVISION  SURGEON:  Surgeon(s): Romie Levee, MD  ASSISTANT: RNFA    ANESTHESIA:   general  EBL:  Total I/O In: 2100 [I.V.:2100] Out: 75 [Blood:75]  DRAINS: none   SPECIMEN:  No Specimen  DISPOSITION OF SPECIMEN:  N/A  COUNTS:  YES  PLAN OF CARE: Discharge to home after PACU  PATIENT DISPOSITION:  PACU - hemodynamically stable.  INDICATION: Cody Coleman is a 67 y.o. M with difficulty pouching due to ostomy prolapse.     OR FINDINGS: Slight fascial defect, internal prolapse of colostomy   DESCRIPTION: the patient was identified in the preoperative holding area and taken to the OR where they were laid supine on the operating room table.  General anesthesia was induced without difficulty. SCDs were also noted to be in place prior to the initiation of anesthesia.  The ostomy was sutured closed with a 2-0 silk suture.  The patient was then prepped and draped in the usual sterile fashion.    A surgical timeout was performed indicating the correct patient, procedure, positioning and need for preoperative antibiotics.  I began by making an incision at the mucocutaneous junction using Bovie electric artery. This was carried down to the level of the fascia dissecting out the ostomy from the surrounding subcutaneous fat. The ostomy was then separated from the fascia circumferentially. After this was completed approximately 8 inches of sigmoid colon could be prolapsed through the fascial defect. The defect itself felt a little bit enlarged. That he get 3 fingers Inside without difficulty.  I then removed the hernia sac using Bovie electrocautery. Hemostasis was achieved using light artery as well. The fascia was cleared  circumferentially. 0 Prolene sutures were used to tighten up the fascia in interrupted fashion. After the sutures were placed the patient inadvertently coughed and all the sutures broke open. The fascia did not rip but the sutures themselves broke. I replaced the sutures with Ethibond sutures also an interrupted fashion. After this was completed I secured the colon to the fascia using 2-0 Vicryl sutures. The ostomy was then matured in standard [place] fashion also using 2-0 Vicryl sutures. I used 3-0 Vicryl sutures subcutaneously to close part of the fascial defect. Once this was completed I checked for patency. I could easily get 1 finger into the ostomy and through the fascia.  An ostomy appliance was in place. The patient was then awakened from anesthesia and sent to the post anesthesia care unit in stable condition. All counts were correct per operating room staff.

## 2013-01-17 NOTE — Anesthesia Preprocedure Evaluation (Addendum)
Anesthesia Evaluation  Patient identified by MRN, date of birth, ID band Patient awake    Reviewed: Allergy & Precautions, H&P , NPO status , Patient's Chart, lab work & pertinent test results, reviewed documented beta blocker date and time   Airway Mallampati: II TM Distance: >3 FB     Dental  (+) Poor Dentition   Pulmonary former smoker,          Cardiovascular hypertension, Pt. on medications and Pt. on home beta blockers + CAD, + Past MI and + CABG  '08 stress test: normal perfusion, no ischemia   Neuro/Psych    GI/Hepatic negative GI ROS, Neg liver ROS,   Endo/Other  Morbid obesity  Renal/GU negative Renal ROS     Musculoskeletal   Abdominal   Peds  Hematology   Anesthesia Other Findings   Reproductive/Obstetrics                          Anesthesia Physical Anesthesia Plan  ASA: III  Anesthesia Plan: General   Post-op Pain Management:    Induction: Intravenous  Airway Management Planned: Oral ETT  Additional Equipment:   Intra-op Plan:   Post-operative Plan: Extubation in OR  Informed Consent: I have reviewed the patients History and Physical, chart, labs and discussed the procedure including the risks, benefits and alternatives for the proposed anesthesia with the patient or authorized representative who has indicated his/her understanding and acceptance.   Dental advisory given  Plan Discussed with: Anesthesiologist and Surgeon  Anesthesia Plan Comments:         Anesthesia Quick Evaluation

## 2013-01-17 NOTE — Anesthesia Postprocedure Evaluation (Signed)
  Anesthesia Post-op Note  Patient: Cody Coleman  Procedure(s) Performed: Procedure(s): COLOSTOMY REVISION (N/A)  Patient Location: PACU  Anesthesia Type:General  Level of Consciousness: awake, alert , oriented and patient cooperative  Airway and Oxygen Therapy: Patient Spontanous Breathing  Post-op Pain: mild  Post-op Assessment: Post-op Vital signs reviewed, Patient's Cardiovascular Status Stable, Respiratory Function Stable, Patent Airway, No signs of Nausea or vomiting, Adequate PO intake and Pain level controlled  Post-op Vital Signs: Reviewed and stable  Complications: No apparent anesthesia complications

## 2013-01-17 NOTE — Transfer of Care (Signed)
Immediate Anesthesia Transfer of Care Note  Patient: Cody Coleman  Procedure(s) Performed: Procedure(s): COLOSTOMY REVISION (N/A)  Patient Location: PACU  Anesthesia Type:General  Level of Consciousness: awake, alert , oriented and patient cooperative  Airway & Oxygen Therapy: Patient Spontanous Breathing and Patient connected to face mask oxygen  Post-op Assessment: Report given to PACU RN, Post -op Vital signs reviewed and stable and Patient moving all extremities  Post vital signs: Reviewed and stable  Complications: No apparent anesthesia complications

## 2013-01-17 NOTE — Preoperative (Signed)
Beta Blockers   Reason not to administer Beta Blockers:Not Applicable 

## 2013-01-17 NOTE — H&P (Signed)
Cody Coleman is having pouching problems.  CT shows need for colostomy revision.   Past Medical History  Diagnosis Date  . CAD (coronary artery disease)   . High cholesterol   . Urgency-frequency syndrome     FLOMAX HAS HELPED  . MI (myocardial infarction)     x 2  AT AGE 67 AND AT AGE 18  DR. [facility] IS PT'S MEDICAL DOCTOR  . Cancer     RECTAL CANCER--SOME RECTAL BLEEDING  . Pain     RIGHT KNEE PAIN AND OCCAS OTHER JOINT PAINS  . Sleep difficulties     NEVER SLEEPS WELL - WAKES UP AFTER SEVERAL HOURS AND NOT ABLE TO GO BACK TO SLEEP--DID SLEEP STUDY AND TOLD HE DID NOT HAVE SLEEP APNEA   Past Surgical History  Procedure Laterality Date  . Coronary artery bypass graft      2005  . Tonsillectomy    . Coronary angioplasty with stent placement    . Colonoscopy N/A 04/20/2012    Procedure: COLONOSCOPY;  Surgeon: Malissa Hippo, MD;  Location: [facility];  Service: Endoscopy;  Laterality: N/A;  225  . Eus N/A 05/03/2012    Procedure: LOWER ENDOSCOPIC ULTRASOUND (EUS);  Surgeon: Rachael Fee, MD;  Location: [facility];  Service: Endoscopy;  Laterality: N/A;  . Laparoscopic assisted abdominal perineal resection N/A 06/11/2012    Procedure: LAPAROSCOPIC ASSISTED ABDOMINAL PERINEAL RESECTION;  Surgeon: Romie Levee, MD;  Location: [facility]S;  Service: General;  Laterality: N/A;     Medication List    ASK your doctor about these medications       ALPRAZolam 1 MG tablet  Commonly known as:  XANAX  Take 1 mg by mouth 3 (three) times daily as needed for sleep or anxiety. USUALLY WOULD ONLY TAKE 1/2 TAB IF NEEDED FOR SLEEP OR ANXIETY     aspirin 325 MG tablet  Take 325 mg by mouth every morning.     atorvastatin 20 MG tablet  Commonly known as:  LIPITOR  Take 20 mg by mouth daily.     clarithromycin 500 MG tablet  Commonly known as:  BIAXIN  Take 500 mg by mouth 2 (two) times daily.     docusate sodium 100 MG capsule  Commonly known as:  COLACE  Take 100 mg by mouth as  needed for constipation.     ezetimibe 10 MG tablet  Commonly known as:  ZETIA  Take 10 mg by mouth every morning.     hydrocortisone 2.5 % lotion  Apply to affected area 2 times daily     sildenafil 100 MG tablet  Commonly known as:  VIAGRA  Take 100 mg by mouth daily as needed for erectile dysfunction.     tamsulosin 0.4 MG Caps capsule  Commonly known as:  FLOMAX  Take 0.4 mg by mouth daily after breakfast.       Allergies  Allergen Reactions  . Augmentin [Amoxicillin-Pot Clavulanate] Swelling and Rash   History   Social History  . Marital Status: Married    Spouse Name: N/A    Number of Children: N/A  . Years of Education: N/A   Occupational History  . Not on file.   Social History Main Topics  . Smoking status: Former Games developer  . Smokeless tobacco: Never Used     Comment: quit 69yrs ago  . Alcohol Use: 15.0 oz/week    25 Shots of liquor per week     Comment: drinks 2-3 drinks on  occasion  . Drug Use: No  . Sexual Activity: Not on file   Other Topics Concern  . Not on file   Social History Narrative  . No narrative on file   Family History  Problem Relation Age of Onset  . Colon cancer Neg Hx   . Congestive Heart Failure Mother   . Stroke Father   . Coronary artery disease Brother    Review of Systems  Constitutional: Negative for fever and chills.  Respiratory: Negative for cough and shortness of breath.   Cardiovascular: Negative for chest pain.  Gastrointestinal: Negative for nausea, vomiting and abdominal pain.  Genitourinary: Negative for dysuria, urgency and frequency.  Neurological: Negative for headaches.  Psychiatric/Behavioral: Negative for depression.   Physical Exam  Constitutional: He is oriented to person, place, and time. He appears well-developed and well-nourished.  HENT:  Head: Normocephalic and atraumatic.  Eyes: EOM are normal. Pupils are equal, round, and reactive to light.  Neck: Normal range of motion. Neck supple.   Cardiovascular: Normal rate and regular rhythm.   Pulmonary/Chest: Effort normal.  Abdominal: Soft.  Musculoskeletal: Normal range of motion.  Neurological: He is alert and oriented to person, place, and time.   A/P: colostomy dysfunction  OR for revision

## 2013-01-20 ENCOUNTER — Telehealth (INDEPENDENT_AMBULATORY_CARE_PROVIDER_SITE_OTHER): Payer: Self-pay | Admitting: Surgery

## 2013-01-20 DIAGNOSIS — K5909 Other constipation: Secondary | ICD-10-CM | POA: Insufficient documentation

## 2013-01-20 DIAGNOSIS — K9409 Other complications of colostomy: Secondary | ICD-10-CM

## 2013-01-20 DIAGNOSIS — K435 Parastomal hernia without obstruction or  gangrene: Secondary | ICD-10-CM | POA: Insufficient documentation

## 2013-01-20 NOTE — Telephone Encounter (Signed)
01/17/2013  10:30 AM  PATIENT: Cody Coleman 67 y.o. male  Patient Care Team:  Karleen Hampshire, MD as PCP - General (Family Medicine)  PRE-OPERATIVE DIAGNOSIS: colostomy dysfunction  POST-OPERATIVE DIAGNOSIS: colostomy dysfunction  PROCEDURE: COLOSTOMY REVISION  SURGEON: Surgeon(s):  Romie Levee, MD   Patient's wife called with concerns.  Patient underwent ostomy revision and repair of a parastomal hernia a few days ago.  Patient is having hiccups.  No bowel movement yet.  Low-grade fevers.  Tolerating liquids.  Advanced to solid diet.  Having flatus in the bag.  Walking around okay.  Denies abdominal pain.  No definite chills or sweats.  Did have some wheezing last night.  Receive nebulizers treatments.  Cough up a little bit of blood.  Breathing better.   I recommend he stay to liquids only.  Try more aggressive laxative regimen (he has needed this often in the past) since he is having flatus without abdominal pain to get things started.  I am hesitant to have him try an enema through the colostomy given recent ostomy revision/surgery. If worsening breathing problems, discussed with his primary care physician to see if he needs to be evaluated soon to adjust things.    If he feels worsening abdominal pain, nausea/vomiting, concerned; only option is to go to emergency room over weekend vs. Seen in the office Early next week.Recommendations were again discussed numerous different ways.  Options were discussed.  She expressed understanding and appreciation and will keep in close touch.

## 2013-01-21 ENCOUNTER — Encounter (HOSPITAL_COMMUNITY): Payer: Self-pay | Admitting: Emergency Medicine

## 2013-01-21 ENCOUNTER — Emergency Department (HOSPITAL_COMMUNITY): Payer: 59

## 2013-01-21 ENCOUNTER — Inpatient Hospital Stay (HOSPITAL_COMMUNITY)
Admission: EM | Admit: 2013-01-21 | Discharge: 2013-01-23 | DRG: 871 | Disposition: A | Discharge status: Home / Self Care | Payer: 59 | Attending: Internal Medicine | Admitting: Internal Medicine

## 2013-01-21 ENCOUNTER — Telehealth (INDEPENDENT_AMBULATORY_CARE_PROVIDER_SITE_OTHER): Payer: Self-pay | Admitting: General Surgery

## 2013-01-21 DIAGNOSIS — Z7982 Long term (current) use of aspirin: Secondary | ICD-10-CM

## 2013-01-21 DIAGNOSIS — R066 Hiccough: Secondary | ICD-10-CM | POA: Diagnosis present

## 2013-01-21 DIAGNOSIS — Z880 Allergy status to penicillin: Secondary | ICD-10-CM

## 2013-01-21 DIAGNOSIS — L03311 Cellulitis of abdominal wall: Secondary | ICD-10-CM

## 2013-01-21 DIAGNOSIS — J441 Chronic obstructive pulmonary disease with (acute) exacerbation: Secondary | ICD-10-CM

## 2013-01-21 DIAGNOSIS — I252 Old myocardial infarction: Secondary | ICD-10-CM

## 2013-01-21 DIAGNOSIS — J9600 Acute respiratory failure, unspecified whether with hypoxia or hypercapnia: Secondary | ICD-10-CM | POA: Diagnosis present

## 2013-01-21 DIAGNOSIS — Z8249 Family history of ischemic heart disease and other diseases of the circulatory system: Secondary | ICD-10-CM

## 2013-01-21 DIAGNOSIS — R509 Fever, unspecified: Secondary | ICD-10-CM | POA: Diagnosis present

## 2013-01-21 DIAGNOSIS — A419 Sepsis, unspecified organism: Principal | ICD-10-CM | POA: Diagnosis present

## 2013-01-21 DIAGNOSIS — C20 Malignant neoplasm of rectum: Secondary | ICD-10-CM | POA: Diagnosis present

## 2013-01-21 DIAGNOSIS — F411 Generalized anxiety disorder: Secondary | ICD-10-CM | POA: Diagnosis present

## 2013-01-21 DIAGNOSIS — Z9861 Coronary angioplasty status: Secondary | ICD-10-CM

## 2013-01-21 DIAGNOSIS — Z87891 Personal history of nicotine dependence: Secondary | ICD-10-CM

## 2013-01-21 DIAGNOSIS — J9601 Acute respiratory failure with hypoxia: Secondary | ICD-10-CM

## 2013-01-21 DIAGNOSIS — J111 Influenza due to unidentified influenza virus with other respiratory manifestations: Secondary | ICD-10-CM

## 2013-01-21 DIAGNOSIS — Z933 Colostomy status: Secondary | ICD-10-CM

## 2013-01-21 DIAGNOSIS — I498 Other specified cardiac arrhythmias: Secondary | ICD-10-CM | POA: Diagnosis present

## 2013-01-21 DIAGNOSIS — K5900 Constipation, unspecified: Secondary | ICD-10-CM | POA: Diagnosis present

## 2013-01-21 DIAGNOSIS — Z823 Family history of stroke: Secondary | ICD-10-CM

## 2013-01-21 DIAGNOSIS — N400 Enlarged prostate without lower urinary tract symptoms: Secondary | ICD-10-CM | POA: Diagnosis present

## 2013-01-21 DIAGNOSIS — I25810 Atherosclerosis of coronary artery bypass graft(s) without angina pectoris: Secondary | ICD-10-CM | POA: Diagnosis present

## 2013-01-21 DIAGNOSIS — J40 Bronchitis, not specified as acute or chronic: Secondary | ICD-10-CM

## 2013-01-21 DIAGNOSIS — L02219 Cutaneous abscess of trunk, unspecified: Secondary | ICD-10-CM

## 2013-01-21 HISTORY — DX: Benign prostatic hyperplasia without lower urinary tract symptoms: N40.0

## 2013-01-21 HISTORY — DX: Personal history of nicotine dependence: Z87.891

## 2013-01-21 LAB — URINALYSIS W MICROSCOPIC + REFLEX CULTURE
Glucose, UA: NEGATIVE mg/dL
Hgb urine dipstick: NEGATIVE
Ketones, ur: 15 mg/dL — AB
Leukocytes, UA: NEGATIVE
Protein, ur: 30 mg/dL — AB
pH: 7.5 (ref 5.0–8.0)

## 2013-01-21 LAB — CBC WITH DIFFERENTIAL/PLATELET
Basophils Relative: 0 % (ref 0–1)
Eosinophils Absolute: 0.1 10*3/uL (ref 0.0–0.7)
Eosinophils Relative: 2 % (ref 0–5)
HCT: 38.2 % — ABNORMAL LOW (ref 39.0–52.0)
Hemoglobin: 13.2 g/dL (ref 13.0–17.0)
Lymphs Abs: 0.2 10*3/uL — ABNORMAL LOW (ref 0.7–4.0)
MCH: 33.2 pg (ref 26.0–34.0)
MCHC: 34.6 g/dL (ref 30.0–36.0)
MCV: 96 fL (ref 78.0–100.0)
Monocytes Absolute: 0.7 10*3/uL (ref 0.1–1.0)
Monocytes Relative: 15 % — ABNORMAL HIGH (ref 3–12)
RBC: 3.98 MIL/uL — ABNORMAL LOW (ref 4.22–5.81)

## 2013-01-21 LAB — PRO B NATRIURETIC PEPTIDE: Pro B Natriuretic peptide (BNP): 2189 pg/mL — ABNORMAL HIGH (ref 0–125)

## 2013-01-21 LAB — INFLUENZA PANEL BY PCR (TYPE A & B)
Influenza A By PCR: POSITIVE — AB
Influenza B By PCR: NEGATIVE

## 2013-01-21 LAB — BASIC METABOLIC PANEL
BUN: 14 mg/dL (ref 6–23)
CO2: 26 mEq/L (ref 19–32)
Creatinine, Ser: 0.79 mg/dL (ref 0.50–1.35)
GFR calc Af Amer: >90 mL/min (ref >90)
GFR calc non Af Amer: >90 mL/min (ref >90)
Glucose, Bld: 131 mg/dL — ABNORMAL HIGH (ref 70–99)
Potassium: 3.9 mEq/L (ref 3.5–5.1)

## 2013-01-21 LAB — TROPONIN I: Troponin I: <0.3 ng/mL (ref <0.30)

## 2013-01-21 LAB — OCCULT BLOOD, POC DEVICE: Fecal Occult Bld: POSITIVE — AB

## 2013-01-21 MED ORDER — VANCOMYCIN HCL 10 G IV SOLR
1250.0000 mg | Freq: Two times a day (BID) | INTRAVENOUS | Status: DC
  Administered 2013-01-21 – 2013-01-22 (×2): 1250 mg via INTRAVENOUS
  Filled 2013-01-21 (×3): qty 1250

## 2013-01-21 MED ORDER — TAMSULOSIN HCL 0.4 MG PO CAPS
0.4000 mg | ORAL_CAPSULE | Freq: Every day | ORAL | Status: DC
  Administered 2013-01-22 – 2013-01-23 (×2): 0.4 mg via ORAL
  Filled 2013-01-21 (×3): qty 1

## 2013-01-21 MED ORDER — KCL IN DEXTROSE-NACL 20-5-0.45 MEQ/L-%-% IV SOLN
INTRAVENOUS | Status: DC
Start: 2013-01-21 — End: 2013-01-23
  Administered 2013-01-21 – 2013-01-23 (×3): via INTRAVENOUS
  Filled 2013-01-21 (×5): qty 1000

## 2013-01-21 MED ORDER — ALPRAZOLAM 0.5 MG PO TABS
0.5000 mg | ORAL_TABLET | Freq: Three times a day (TID) | ORAL | Status: DC | PRN
  Administered 2013-01-21 – 2013-01-22 (×2): 0.5 mg via ORAL
  Filled 2013-01-21: qty 1
  Filled 2013-01-21: qty 2

## 2013-01-21 MED ORDER — SODIUM CHLORIDE 0.9 % IV SOLN
500.0000 mg | Freq: Four times a day (QID) | INTRAVENOUS | Status: DC
  Administered 2013-01-21 – 2013-01-22 (×4): 500 mg via INTRAVENOUS
  Filled 2013-01-21 (×5): qty 500

## 2013-01-21 MED ORDER — ACETAMINOPHEN 325 MG PO TABS
650.0000 mg | ORAL_TABLET | Freq: Four times a day (QID) | ORAL | Status: DC | PRN
  Administered 2013-01-21 – 2013-01-23 (×3): 650 mg via ORAL
  Filled 2013-01-21 (×3): qty 2

## 2013-01-21 MED ORDER — HYDROMORPHONE HCL PF 1 MG/ML IJ SOLN: 1.0000 mg | INTRAMUSCULAR | Status: DC | PRN

## 2013-01-21 MED ORDER — BUDESONIDE 0.5 MG/2ML IN SUSP
0.5000 mg | Freq: Four times a day (QID) | RESPIRATORY_TRACT | Status: DC
  Administered 2013-01-21 – 2013-01-23 (×8): 0.5 mg via RESPIRATORY_TRACT
  Filled 2013-01-21 (×16): qty 2

## 2013-01-21 MED ORDER — HYDROCORTISONE 2.5 % EX LOTN: TOPICAL_LOTION | Freq: Two times a day (BID) | CUTANEOUS | Status: DC | PRN

## 2013-01-21 MED ORDER — SODIUM CHLORIDE 0.9 % IJ SOLN: 3.0000 mL | Freq: Two times a day (BID) | INTRAMUSCULAR | Status: DC

## 2013-01-21 MED ORDER — LEVALBUTEROL HCL 0.63 MG/3ML IN NEBU
0.6300 mg | INHALATION_SOLUTION | Freq: Once | RESPIRATORY_TRACT | Status: AC
  Administered 2013-01-21: 0.63 mg via RESPIRATORY_TRACT
  Filled 2013-01-21: qty 3

## 2013-01-21 MED ORDER — IBUPROFEN 600 MG PO TABS
600.0000 mg | ORAL_TABLET | Freq: Four times a day (QID) | ORAL | Status: DC | PRN
  Filled 2013-01-21: qty 1

## 2013-01-21 MED ORDER — ONDANSETRON HCL 4 MG PO TABS: 4.0000 mg | ORAL_TABLET | Freq: Four times a day (QID) | ORAL | Status: DC | PRN

## 2013-01-21 MED ORDER — IPRATROPIUM BROMIDE 0.02 % IN SOLN
0.5000 mg | RESPIRATORY_TRACT | Status: DC
  Administered 2013-01-21 – 2013-01-23 (×13): 0.5 mg via RESPIRATORY_TRACT
  Filled 2013-01-21 (×12): qty 2.5

## 2013-01-21 MED ORDER — OXYCODONE HCL 5 MG PO TABS
5.0000 mg | ORAL_TABLET | Freq: Four times a day (QID) | ORAL | Status: DC | PRN
  Administered 2013-01-21: 5 mg via ORAL
  Administered 2013-01-21: 10 mg via ORAL
  Administered 2013-01-23: 5 mg via ORAL
  Filled 2013-01-21: qty 2
  Filled 2013-01-21 (×2): qty 1

## 2013-01-21 MED ORDER — HYDROCORTISONE 1 % EX LOTN
TOPICAL_LOTION | Freq: Two times a day (BID) | CUTANEOUS | Status: DC | PRN
  Filled 2013-01-21: qty 118

## 2013-01-21 MED ORDER — SODIUM CHLORIDE 0.9 % IV BOLUS (SEPSIS)
500.0000 mL | Freq: Once | INTRAVENOUS | Status: AC
  Administered 2013-01-21: 500 mL via INTRAVENOUS

## 2013-01-21 MED ORDER — IOHEXOL 350 MG/ML SOLN
100.0000 mL | Freq: Once | INTRAVENOUS | Status: AC | PRN
  Administered 2013-01-21: 100 mL via INTRAVENOUS

## 2013-01-21 MED ORDER — IPRATROPIUM BROMIDE 0.02 % IN SOLN
0.5000 mg | Freq: Once | RESPIRATORY_TRACT | Status: AC
  Administered 2013-01-21: 0.5 mg via RESPIRATORY_TRACT
  Filled 2013-01-21: qty 2.5

## 2013-01-21 MED ORDER — LEVALBUTEROL HCL 1.25 MG/0.5ML IN NEBU
1.2500 mg | INHALATION_SOLUTION | RESPIRATORY_TRACT | Status: DC
  Administered 2013-01-21 – 2013-01-23 (×13): 1.25 mg via RESPIRATORY_TRACT
  Filled 2013-01-21 (×19): qty 0.5

## 2013-01-21 MED ORDER — ONDANSETRON HCL 4 MG/2ML IJ SOLN: 4.0000 mg | Freq: Four times a day (QID) | INTRAMUSCULAR | Status: DC | PRN

## 2013-01-21 MED ORDER — ACETAMINOPHEN 650 MG RE SUPP: 650.0000 mg | Freq: Four times a day (QID) | RECTAL | Status: DC | PRN

## 2013-01-21 MED ORDER — OSELTAMIVIR PHOSPHATE 75 MG PO CAPS
75.0000 mg | ORAL_CAPSULE | Freq: Two times a day (BID) | ORAL | Status: DC
  Administered 2013-01-21 – 2013-01-23 (×4): 75 mg via ORAL
  Filled 2013-01-21 (×5): qty 1

## 2013-01-21 MED ORDER — DM-GUAIFENESIN ER 30-600 MG PO TB12
1.0000 | ORAL_TABLET | Freq: Two times a day (BID) | ORAL | Status: DC
  Administered 2013-01-21 – 2013-01-23 (×4): 1 via ORAL
  Filled 2013-01-21 (×6): qty 1

## 2013-01-21 MED ORDER — ATORVASTATIN CALCIUM 20 MG PO TABS
20.0000 mg | ORAL_TABLET | Freq: Every day | ORAL | Status: DC
  Administered 2013-01-22 – 2013-01-23 (×2): 20 mg via ORAL
  Filled 2013-01-21 (×2): qty 1

## 2013-01-21 MED ORDER — VANCOMYCIN HCL IN DEXTROSE 1-5 GM/200ML-% IV SOLN
1000.0000 mg | Freq: Once | INTRAVENOUS | Status: DC
  Filled 2013-01-21: qty 200

## 2013-01-21 MED ORDER — ACETAMINOPHEN 500 MG PO TABS
1000.0000 mg | ORAL_TABLET | Freq: Once | ORAL | Status: AC
  Administered 2013-01-21: 1000 mg via ORAL
  Filled 2013-01-21: qty 2

## 2013-01-21 MED ORDER — HYDROCOD POLST-CHLORPHEN POLST 10-8 MG/5ML PO LQCR
5.0000 mL | Freq: Two times a day (BID) | ORAL | Status: DC | PRN
  Administered 2013-01-21: 5 mL via ORAL
  Filled 2013-01-21: qty 5

## 2013-01-21 MED ORDER — EZETIMIBE 10 MG PO TABS
10.0000 mg | ORAL_TABLET | Freq: Every morning | ORAL | Status: DC
  Administered 2013-01-22 – 2013-01-23 (×2): 10 mg via ORAL
  Filled 2013-01-21 (×2): qty 1

## 2013-01-21 MED ORDER — SODIUM CHLORIDE 0.9 % IV SOLN
INTRAVENOUS | Status: DC
  Administered 2013-01-21: 08:00:00 via INTRAVENOUS

## 2013-01-21 NOTE — H&P (Addendum)
[facility] History and Physical  Cody Coleman NWG:[id number] DOB: 31-May-1945 DOA: 01/21/2013  Referring physician:  Samuel Jester PCP:  Kirk Ruths, MD   Chief Complaint:  Fever, abdominal redness and pain, wheezing  HPI:  The patient is a 67 y.o. year-old male with history of CAD s/p CABG, wheezing with viral illnesses, history of tobacco use, rectal cancer s/p resection and not requiring chemotherapy and radiation, colostomy, who presents with fever, wheezing, cough, abdominal redness.  The patient was last at their baseline health 12/18 when he underwent a colostomy revision due to parastomal hernia and widening of his ostomy aperture.  Intraoperatively, the patient inadvertently coughed and broke his abdominal sutures. He did not tear open his fascia, and sutures were replaced by the surgeon.  At home, he developed low-grade temperatures to 100.2. He started wheezing and coughing. He developed some constipation and spoke with the surgeons regarding a bowel regimen. His constipation has since resolved. Over the last 2 days he has had increasing hiccuping and shortness of breath. He has been using his wife's nebulizer without improvement.  This morning, he had a temperature to 102.9 Fahrenheit and fast heart rate. He also had some new erythema that extended from his ostomy site all the way down to the pannus and almost to the penis. Erythema has spread laterally across his abdomen over the course of the day today.  His wife brought him to the emergency department.  In the emergency department, he was afebrile, heart rate 140, respirations 20, blood pressure 92/69, oxygen saturation 91% on room air. His labs were notable for a white blood cell count of 5, hemoglobin 13.2, platelets 153, sodium 134, creatinine normal. Troponin was less than 0.3. His EKG was read as sinus tachycardia, however, on telemetry appears that he may have some atrial fibrillation. His chest x-ray demonstrated  stable moderate cardiomegaly without pulmonary edema, changes consistent with chronic bronchitis and/or asthma.  CT scan of the abdomen and pelvis demonstrated a 4.5 x 8.5 x 3.8 cm fluid collection in the lateral aspect of the ostomy site in the anterior abdominal wall containing small gas bubbles. There was also edema and inflammation within the ostomy in the left lower artery. There is fluid immediately anterior to the lateral aspect of the divided left rectus muscle and edema of the muscle itself. There was no evidence of intraperitoneal edema, inflammation, or abscess.  These findings were discussed with the patient's primary surgeon who was informed that the patient did not have a white blood cell count or fever, and stated that these changes on the CAT scan were probably consistent with postoperative edema. CT scan of the chest demonstrated no evidence of pulmonary embolism however there was moderate to marked central bronchial wall thickening consistent with bronchitis, biapical pleural thickening which have progressed, stable cardiomegaly with left ventricular enlargement and hypertrophy.  Review of Systems:  General:  Positive fevers, chills HEENT:  + rhinorrhea, sinus congestion, sore throat CV:  Denies chest pain and palpitations, lower extremity edema.  PULM:  Per HPI  GI:  Denies nausea, vomiting, constipation is now resolved. GU:  Denies dysuria, frequency, urgency ENDO:  Denies polyuria, polydipsia.   HEME:  Denies hematemesis, blood in stools, melena, abnormal bruising or bleeding.  LYMPH:  Denies lymphadenopathy.   MSK:  Denies arthralgias, myalgias.   DERM:  Abdominal wall erythema NEURO:  Denies focal numbness, weakness, slurred speech, confusion, facial droop.  PSYCH:  Denies anxiety and depression.    Past Medical History  Diagnosis Date  . CAD (coronary artery disease)   . High cholesterol   . Urgency-frequency syndrome     FLOMAX HAS HELPED  . MI (myocardial infarction)      x 2  AT AGE 74 AND AT AGE 61  DR. [facility] IS PT'S MEDICAL DOCTOR  . Cancer     RECTAL CANCER--SOME RECTAL BLEEDING  . Pain     RIGHT KNEE PAIN AND OCCAS OTHER JOINT PAINS  . Sleep difficulties     NEVER SLEEPS WELL - WAKES UP AFTER SEVERAL HOURS AND NOT ABLE TO GO BACK TO SLEEP--DID SLEEP STUDY AND TOLD HE DID NOT HAVE SLEEP APNEA  . Former tobacco use   . BPH (benign prostatic hyperplasia)    Past Surgical History  Procedure Laterality Date  . Coronary artery bypass graft      2005  . Tonsillectomy    . Coronary angioplasty with stent placement      before bypass  . Colonoscopy N/A 04/20/2012    Procedure: COLONOSCOPY;  Surgeon: Malissa Hippo, MD;  Location: [facility];  Service: Endoscopy;  Laterality: N/A;  225  . Eus N/A 05/03/2012    Procedure: LOWER ENDOSCOPIC ULTRASOUND (EUS);  Surgeon: Rachael Fee, MD;  Location: [facility];  Service: Endoscopy;  Laterality: N/A;  . Laparoscopic assisted abdominal perineal resection N/A 06/11/2012    Procedure: LAPAROSCOPIC ASSISTED ABDOMINAL PERINEAL RESECTION;  Surgeon: Romie Levee, MD;  Location: [facility];  Service: General;  Laterality: N/A;   Social History:  reports that he quit smoking about 10 years ago. His smoking use included Cigarettes. He has a 30 pack-year smoking history. He has never used smokeless tobacco. He reports that he drinks about 15.0 ounces of alcohol per week. He reports that he does not use illicit drugs. Lives with wife.  Drives, no assist devices.  Allergies  Allergen Reactions  . Augmentin [Amoxicillin-Pot Clavulanate] Swelling and Rash    Family History  Problem Relation Age of Onset  . Colon cancer Neg Hx   . Congestive Heart Failure Mother   . Stroke Father   . Coronary artery disease Brother   . Coronary artery disease Father      Prior to Admission medications   Medication Sig Start Date End Date Taking? Authorizing Provider  ALPRAZolam Prudy Feeler) 1 MG tablet Take 1 mg by mouth 3 (three)  times daily as needed for sleep or anxiety. USUALLY WOULD ONLY TAKE 1/2 TAB IF NEEDED FOR SLEEP OR ANXIETY   Yes Historical Provider, MD  aspirin 325 MG tablet Take 1 tablet (325 mg total) by mouth every morning. 01/17/13  Yes Romie Levee, MD  atorvastatin (LIPITOR) 20 MG tablet Take 20 mg by mouth daily.   Yes Historical Provider, MD  docusate sodium (COLACE) 100 MG capsule Take 100 mg by mouth as needed for constipation.   Yes Historical Provider, MD  ezetimibe (ZETIA) 10 MG tablet Take 10 mg by mouth every morning.   Yes Historical Provider, MD  hydrocortisone 2.5 % lotion Apply to affected area 2 times daily 01/08/13 01/08/14 Yes Romie Levee, MD  ibuprofen (ADVIL,MOTRIN) 200 MG tablet Take 600 mg by mouth every 6 (six) hours as needed.   Yes Historical Provider, MD  oxyCODONE (OXY IR/ROXICODONE) 5 MG immediate release tablet Take 1-2 tablets (5-10 mg total) by mouth every 6 (six) hours as needed. 01/17/13  Yes Romie Levee, MD  tamsulosin (FLOMAX) 0.4 MG CAPS Take 0.4 mg by mouth daily after breakfast.  Yes Historical Provider, MD   Physical Exam: Filed Vitals:   01/21/13 0750 01/21/13 0758 01/21/13 0803 01/21/13 1142  BP: 111/68 111/68  104/72  Pulse: 129 129    Temp:   98.8 F (37.1 C) 97.1 F (36.2 C)  TempSrc:   Oral Oral  Resp: 19 20  18   Height:      Weight:      SpO2: 98%   93%     General:  CM, audible wheezing, NAD  Eyes:  PERRL, anicteric, non-injected.  ENT:  Nares clear.  OP with mildly erythematous soft palate and tonsils, no exudate or plaques.  MMM.  Neck:  Supple without TM or JVD.    Lymph:  No cervical, supraclavicular, or submandibular LAD.  Cardiovascular:  RRR, normal S1, S2, without m/r/g.  2+ pulses, warm extremities  Respiratory:   Rhonchi +, full expiratory wheeze, no focal rales, no increased WOB.  + prolonged expiratory phase  Abdomen:  Hypoactive BS.  Soft, mildly distended, TTP in the suprapubic area and bilateral lower quadrants wtihout  rebound or guarding  Skin:  Pink erythema that extends from ostomy site down the abdomen to just above the penis and laterally not quite to the mid-axillary line on the left and to the anterior-axillary line on the right.  Indistinct margins.  Line drawn by myself estimating area of erythema.  Musculoskeletal:  Normal bulk and tone.  No LE edema.  Psychiatric:  A & O x 4.  Appropriate affect.  Neurologic:  CN 3-12 intact.  5/5 strength.  Sensation intact.  Labs on Admission:  Basic Metabolic Panel:  Recent Labs Lab 01/16/13 0829 01/21/13 0650  NA 140 134*  K 4.0 3.9  CL 100 96  CO2 28 26  GLUCOSE 109* 131*  BUN 14 14  CREATININE 0.73 0.79  CALCIUM 9.0 8.7   Liver Function Tests:  Recent Labs Lab 01/16/13 0829  AST 63*  ALT 57*  ALKPHOS 66  BILITOT 1.3*  PROT 7.2  ALBUMIN 4.1   No results found for this basename: LIPASE, AMYLASE,  in the last 168 hours No results found for this basename: AMMONIA,  in the last 168 hours CBC:  Recent Labs Lab 01/16/13 0829 01/21/13 0650  WBC 4.8 5.0  NEUTROABS  --  3.9  HGB 15.2 13.2  HCT 43.0 38.2*  MCV 94.7 96.0  PLT 138* 153   Cardiac Enzymes:  Recent Labs Lab 01/21/13 0817  TROPONINI <0.30    BNP (last 3 results) No results found for this basename: PROBNP,  in the last 8760 hours CBG: No results found for this basename: GLUCAP,  in the last 168 hours  Radiological Exams on Admission: Dg Chest 2 View  01/21/2013   CLINICAL DATA:  Cough. chest congestion. Former smoker. Prior CABG. Personal history of rectal carcinoma. Four days post repair of parastomal hernia and colostomy prolapse.  EXAM: CHEST  2 VIEW  COMPARISON:  PET-CT 05/15/2012. CT chest 04/26/2012. One view chest x-ray 05/27/2008.  FINDINGS: Prior sternotomy for CABG. Cardiac silhouette moderately enlarged but stable. Suboptimal inspiration accounts for crowded bronchovascular markings at the lung bases. Stable moderate central peribronchial thickening.  Lungs otherwise clear. No localized airspace consolidation. No pleural effusions. No pneumothorax. Normal pulmonary vascularity. Borderline gaseous distention of the visualized colon and multiple loops of small bowel in the visualized upper abdomen, with air-fluid levels. Mild degenerative changes throughout the thoracic spine.  IMPRESSION: 1. No acute cardiopulmonary disease. 2. Stable moderate cardiomegaly without pulmonary edema.  Stable moderate changes chronic bronchitis and/or asthma. 3. Bowel gas pattern in the visualized upper abdomen consistent with postoperative ileus.   Electronically Signed   By: Hulan Saas M.D.   On: 01/21/2013 07:11   Ct Angio Chest Pe W/cm &/or Wo Cm  01/21/2013   CLINICAL DATA:  Four days postop ostomy revision and parastomal hernia repair in this patient with personal history of rectal cancer post low AP resection in April, 2014. Cough. Chest congestion. Diaphoresis. Body aches. Chills. Erythema surrounding the ostomy.  EXAM: CT ANGIOGRAPHY CHEST WITH CONTRAST  TECHNIQUE: Multidetector CT imaging of the chest was performed using the standard protocol during bolus administration of intravenous contrast. Multiplanar CT image reconstructions including MIPs were obtained to evaluate the vascular anatomy.  CONTRAST:  OMNIPAQUE IOHEXOL 350 MG/ML IV. (This was also administered for the CT abdomen and pelvis reported separately).  COMPARISON:  PET-CT 05/15/2012.  CT chest 04/26/2012.  FINDINGS: Contrast opacification of the pulmonary arteries is moderate. Respiratory motion blurred images of the lower thorax. Overall, the study is of moderate to good diagnostic quality.  No filling defects within either main pulmonary artery or their branches in either lung to suggest pulmonary embolism. Heart enlarged with left ventricular predominance and mild left ventricular hypertrophy, unchanged. Prior sternotomy for CABG. Extensive 3 vessel coronary atherosclerosis. No pericardial  effusion. Moderate atherosclerosis involving the thoracic aorta without evidence of aneurysm or dissection. Proximal great vessels widely patent.  Pulmonary parenchyma clear without localized airspace consolidation, interstitial disease, or parenchymal nodules or masses. Central airways patent with moderate to marked central peribronchial thickening. No pleural effusions. Biapical pleural thickening, more so than on the prior examination.  Scattered normal sized lymph nodes throughout the mediastinum. No significant mediastinal, hilar, or axillary lymphadenopathy. Visualized thyroid gland unremarkable.  Bone window images demonstrate mild degenerative changes involving the mid and lower thoracic spine.  Review of the MIP images confirms the above findings.  IMPRESSION: 1. No evidence of pulmonary embolism. 2. Moderate to marked central bronchial wall thickening consistent with bronchitis and/or asthma. No acute cardiopulmonary disease otherwise. 3. Biapical pleural thickening, progressive since the examination 9 months ago. 4. Stable cardiomegaly with left ventricular enlargement and left ventricular hypertrophy.   Electronically Signed   By: Hulan Saas M.D.   On: 01/21/2013 09:48   Ct Abdomen Pelvis W Contrast  01/21/2013   ADDENDUM REPORT: 01/21/2013 10:27  ADDENDUM: In speaking with the surgeon caring for the patient on 01/21/2013 at 1025 hr, she states that the patient does not have a white count or fever and is not acting as though this is an infected fluid collection in the ostomy site. Therefore, likely represents post-operative fluid with post-operative edema elsewhere in the ostomy.   Electronically Signed   By: Hulan Saas M.D.   On: 01/21/2013 10:27   01/21/2013   CLINICAL DATA:  Four days postop ostomy revision and parastomal hernia repair in this patient with personal history of rectal cancer post low AP resection in April, 2014. Cough. Chest congestion. Diaphoresis. Body aches. Chills.  Erythema surrounding the ostomy.  EXAM: CT ABDOMEN AND PELVIS WITH CONTRAST  TECHNIQUE: Multidetector CT imaging of the abdomen and pelvis was performed using the standard protocol following bolus administration of intravenous contrast.  CONTRAST:  OMNIPAQUE IOHEXOL 350 MG/ML IV (also administered for the CTA chest reported separately). Oral contrast was also administered.  COMPARISON:  01/09/2013, 04/26/2012.  PET-CT 4/15/1,014.  FINDINGS: Edema/inflammation within the ostomy in the left lower quadrant, with  an approximate 4.5 x 8.5 x 3.8 cm fluid collection in the lateral aspect of the ostomy site in the anterior abdominal wall, containing small gas bubbles. This fluid collection is immediately anterior to the lateral aspect of the divided left rectus muscle. There is edema/inflammation involving the muscle as well. The descending colon within the ostomy is decompressed. There is no evidence of intraperitoneal edema, inflammation, or abscess.  Normal appearing stomach and small bowel. Prior low AP rectal resection. Entire colon normal in appearance. Normal decompressed appendix in the right mid and low pelvis. No ascites.  Diffuse hepatic steatosis without focal hepatic parenchymal abnormality to suggest metastasis. Normal-appearing spleen with small focus of accessory splenic tissue medial to the spleen below the hilum. Moderate pancreatic atrophy, unchanged. Normal adrenal glands and kidneys. Gallbladder unremarkable by CT. No biliary ductal dilation. Moderate to severe aortoiliofemoral atherosclerosis without aneurysm or dissection. No significant lymphadenopathy.  Presacral soft tissue thickening, likely post radiation change and/or post-surgical scarring related to the prior rectal resection. Urinary bladder decompressed and unremarkable. Prostate gland normal in size for age, containing calcifications.  Bone window images unremarkable.  IMPRESSION: 1. Edema/inflammation involving the ostomy in the left  lower quadrant, with evidence of an abscess in the lateral aspect of the ostomy site in the abdominal wall, measured above. 2. No evidence of intraperitoneal edema, inflammation, or abscess. 3. Diffuse hepatic steatosis without evidence of hepatic metastasis.  Electronically Signed: By: Hulan Saas M.D. On: 01/21/2013 10:00    EKG: Independently reviewed. Sinus tachycardia, MAT given irregular rhythm, difficult to see P-waves.  No ST-segment depression or elevation.    Assessment/Plan Principal Problem:   Sepsis Active Problems:   CAD (coronary artery disease) of artery bypass graft   Fever   Cellulitis and abscess of trunk   Acute respiratory failure with hypoxia   COPD exacerbation  ---  Sepsis with tachycardia, fever to 102.9 Fahrenheit, mild hypotension.  Lactic acid 0.93.  Source may be respiratory from influenza or bronchitis or from abdominal infection.  CT scan demonstrates cellulitis and there is a fluid collection which may represent abscess. Will treat for common causes of skin and soft tissue infection including abdominal pathogens due to recent colostomy revision.  UA neg -  Blood culture pending -  Influenza PCR -  Continue IV fluids -  Start vancomycin and imipenem (penicillin allergy) -  surgery consult -  Narrow abx based on culture results -  NP O. pending surgery evaluation -  Continue oxycodone when necessary pain with Dilaudid for breakthrough pain -  Zofran as needed for nausea  Acute hypoxic respiratory failure with wheezing, likely secondary to asthma or COPD exacerbation. May have been triggered by upper respiratory infection -  Influenza PCR -  Will defer starting systemic steroids secondary to possible severe infection of the abdominal wall -  Start budesonide 0.5 mg 4 times daily -  Start xopenex + atrovent q4h -  Heart failure less likely as there is no evidence of pulmonary edema or interstitial edema on chest x-ray or CT scan of the chest -  And  proBNP - Consider echocardiogram  CAD status post CABG, chest pain-free, troponin negative -   Hold aspirin for now because of possibility of surgery -  Continue statin - Not on ACE inhibitor or beta blocker due to low blood pressures/hypotension  BPH, stable, continue Flomax  Anxiety, stable, continue Xanax when necessary   Sinus tachycardia, MAT, likely related to acute illness/fever, sepsis -  Continue IVF -  ECHO -  TFTs -  Minimize medications which can cause tachycardia where able (xopenex instead of albuterol)  Rectal cancer s/p resection, followed by Dr. Maisie Fus -  Dr. Maisie Fus aware and planning to consult soon  Diet:  NPO Access:  PIV IVF:  yes Proph:  SCD  Code Status: Full Family Communication: patient and wife Disposition Plan: Admit to telemetry  Time spent: 60 min Renae Fickle [facility] Pager [phone]  If 7PM-7AM, please contact night-coverage [email or web address] Password [facility] 01/21/2013, 12:06 PM

## 2013-01-21 NOTE — Progress Notes (Addendum)
ANTIBIOTIC CONSULT NOTE - INITIAL  Pharmacy Consult for Vancomycin, Imipenem Indication: abdominal cellulitis/infection  Allergies  Allergen Reactions  . Augmentin [Amoxicillin-Pot Clavulanate] Swelling and Rash    Patient Measurements: Height: 5' 6.93" (170 cm) Weight: 200 lb (90.719 kg) IBW/kg (Calculated) : 65.94   Labs:  Recent Labs  01/21/13 0650  WBC 5.0  HGB 13.2  PLT 153  CREATININE 0.79   Estimated Creatinine Clearance: 97.4 ml/min (by C-G formula based on Cr of 0.79).  Medical History: Past Medical History  Diagnosis Date  . CAD (coronary artery disease)   . High cholesterol   . Urgency-frequency syndrome     FLOMAX HAS HELPED  . MI (myocardial infarction)     x 2  AT AGE 77 AND AT AGE 63  DR. [facility] IS PT'S MEDICAL DOCTOR  . Cancer     RECTAL CANCER--SOME RECTAL BLEEDING  . Pain     RIGHT KNEE PAIN AND OCCAS OTHER JOINT PAINS  . Sleep difficulties     NEVER SLEEPS WELL - WAKES UP AFTER SEVERAL HOURS AND NOT ABLE TO GO BACK TO SLEEP--DID SLEEP STUDY AND TOLD HE DID NOT HAVE SLEEP APNEA  . Former tobacco use   . BPH (benign prostatic hyperplasia)     Assessment: 67 yoM s/p colostomy revision and repair of a parastomal hernia 12/18 presented 12/22 with c/o surgical site that is warm to touch, cough, chest congestion, diaphoresis, body aches, chills. CXR c/w post-op ileus, CT abd/pelvis showed evidence of abscess in the lateral aspect of the ostomy site in the abd wall.  MD ordered Zosyn per pharmacy but noted pt with Augmentin allergy. Per Dr. Clarene Duke incomplete note: "T/C from General Surgeon Dr. Maisie Fus, case discussed, including: HPI, pertinent PM/SHx, VS/PE, dx testing, ED course and treatment: States the fluid collection is not an abscess, does not need abx, pt's symptoms are not a surgical issue, admit to medicine service she can consult later today prn".  [facility] place order to start Vancomycin and Imipenem for abd cellulitis/infection.   12/22 >> Vanc >>   12/22 >> Imipenem >>   Tmax: afeb  WBCs: wnl Renal: Scr 0.79, CG 97, N 94  12/22 blood x 2 >> ordered  12/22 influenza panel >> ordered   Goal of Therapy:  Vancomycin trough level 10-15 mcg/ml  Plan:   Vancomycin 1250 mg IV q12h  Imipenem 500 mg IV q6g   Geoffry Paradise, PharmD, BCPS Pager: [phone] 11:46 AM Pharmacy #: [id number]

## 2013-01-21 NOTE — ED Notes (Signed)
Patient transported to CT 

## 2013-01-21 NOTE — Telephone Encounter (Signed)
Called patient to check on him to see how is doing, and I show the note from Dr. Michaell Cowing that he talked to him over the weekend and I wanted Mr Sites to call back and ask for [place]. He has a up coming apt with Dr Maisie Fus on 02-05-13.

## 2013-01-21 NOTE — ED Notes (Addendum)
Pt c/o warm to touch surgical site (ostomy revision 12/18), cough, chest congestion, diaphoresis, body aches, chills.

## 2013-01-21 NOTE — ED Provider Notes (Signed)
CSN: [id number]     Arrival date & time 01/21/13  7829 History   First MD Initiated Contact with Patient 01/21/13 0701     Chief Complaint  Patient presents with  . Post-op Problem   (Consider location/radiation/quality/duration/timing/severity/associated sxs/prior Treatment) HPI 67 yo male s/p colostomy prolapse repair last Thursday presents with insidious onset cough, fever, chills, and diaphoresis that started last night. Patient Denies any abdominal pain at this time. Patient reports having chest congestion Monday prior to surgery and started on Biaxin by PCP. Denies N/V. Denies Chest pain and Shortness of breath. Patient admits to some  "rattling in chest". Patient also reports redness and warmth around surgical site of abdomen. Patient's spouse reports fevers to 102 in addition to wheezing.  Past Medical History  Diagnosis Date  . CAD (coronary artery disease)   . High cholesterol   . Urgency-frequency syndrome     FLOMAX HAS HELPED  . MI (myocardial infarction)     x 2  AT AGE 22 AND AT AGE 69  DR. [facility] IS PT'S MEDICAL DOCTOR  . Cancer     RECTAL CANCER--SOME RECTAL BLEEDING  . Pain     RIGHT KNEE PAIN AND OCCAS OTHER JOINT PAINS  . Sleep difficulties     NEVER SLEEPS WELL - WAKES UP AFTER SEVERAL HOURS AND NOT ABLE TO GO BACK TO SLEEP--DID SLEEP STUDY AND TOLD HE DID NOT HAVE SLEEP APNEA  . Former tobacco use   . BPH (benign prostatic hyperplasia)    Past Surgical History  Procedure Laterality Date  . Coronary artery bypass graft      2005  . Tonsillectomy    . Coronary angioplasty with stent placement      before bypass  . Colonoscopy N/A 04/20/2012    Procedure: COLONOSCOPY;  Surgeon: Malissa Hippo, MD;  [facility];  Service: Endoscopy;  Laterality: N/A;  225  . Eus N/A 05/03/2012    Procedure: LOWER ENDOSCOPIC ULTRASOUND (EUS);  Surgeon: Rachael Fee, MD;  Location: [facility];  Service: Endoscopy;  Laterality: N/A;  . Laparoscopic assisted  abdominal perineal resection N/A 06/11/2012    Procedure: LAPAROSCOPIC ASSISTED ABDOMINAL PERINEAL RESECTION;  Surgeon: Romie Levee, MD;  Location: [facility];  Service: General;  Laterality: N/A;   Family History  Problem Relation Age of Onset  . Colon cancer Neg Hx   . Congestive Heart Failure Mother   . Stroke Father   . Coronary artery disease Brother   . Coronary artery disease Father    History  Substance Use Topics  . Smoking status: Former Smoker -- 1.00 packs/day for 30 years    Types: Cigarettes    Quit date: 01/31/2002  . Smokeless tobacco: Never Used     Comment: quit 4yrs ago  . Alcohol Use: 15.0 oz/week    25 Shots of liquor per week     Comment: drinks 2-3 drinks on occasion    Review of Systems  HENT: Positive for congestion.   Respiratory: Positive for cough. Negative for chest tightness and shortness of breath.   Cardiovascular: Negative for chest pain.  All other systems reviewed and are negative.    Allergies  Augmentin  Home Medications   No current outpatient prescriptions on file. BP 115/64  Pulse 122  Temp(Src) 97.7 F (36.5 C) (Oral)  Resp 20  Ht 5\' 7"  (1.702 m)  Wt 198 lb 6.6 oz (90 kg)  BMI 31.07 kg/m2  SpO2 96% Physical Exam  Nursing note  and vitals reviewed. Constitutional: He is oriented to person, place, and time. He appears well-developed and well-nourished. No distress.  HENT:  Head: Normocephalic and atraumatic.  Cardiovascular: Normal rate and regular rhythm.  Exam reveals no gallop and no friction rub.   No murmur heard. Pulmonary/Chest: Effort normal and breath sounds normal. No respiratory distress. He has no wheezes. He has no rales.  Abdominal: He exhibits distension. Bowel sounds are increased. There is no tenderness. There is no guarding.    Musculoskeletal: Normal range of motion. He exhibits no edema.  Neurological: He is alert and oriented to person, place, and time.  Skin: Skin is warm and dry. He is not  diaphoretic.  Psychiatric: He has a normal mood and affect. His behavior is normal.    ED Course  Procedures (including critical care time) Labs Review Labs Reviewed  CBC WITH DIFFERENTIAL - Abnormal; Notable for the following:    RBC 3.98 (*)    HCT 38.2 (*)    Neutrophils Relative % 79 (*)    Lymphocytes Relative 5 (*)    Lymphs Abs 0.2 (*)    Monocytes Relative 15 (*)    All other components within normal limits  BASIC METABOLIC PANEL - Abnormal; Notable for the following:    Sodium 134 (*)    Glucose, Bld 131 (*)    All other components within normal limits  URINALYSIS W MICROSCOPIC + REFLEX CULTURE - Abnormal; Notable for the following:    Color, Urine AMBER (*)    Bilirubin Urine SMALL (*)    Ketones, ur 15 (*)    Protein, ur 30 (*)    All other components within normal limits  INFLUENZA PANEL BY PCR - Abnormal; Notable for the following:    Influenza A By PCR POSITIVE (*)    All other components within normal limits  PRO B NATRIURETIC PEPTIDE - Abnormal; Notable for the following:    Pro B Natriuretic peptide (BNP) 2189.0 (*)    All other components within normal limits  OCCULT BLOOD, POC DEVICE - Abnormal; Notable for the following:    Fecal Occult Bld POSITIVE (*)    All other components within normal limits  CULTURE, BLOOD (ROUTINE X 2)  CULTURE, BLOOD (ROUTINE X 2)  TROPONIN I  BASIC METABOLIC PANEL  CBC  CG4 I-STAT (LACTIC ACID)   Imaging Review Dg Chest 2 View  01/21/2013   CLINICAL DATA:  Cough. chest congestion. Former smoker. Prior CABG. Personal history of rectal carcinoma. Four days post repair of parastomal hernia and colostomy prolapse.  EXAM: CHEST  2 VIEW  COMPARISON:  PET-CT 05/15/2012. CT chest 04/26/2012. One view chest x-ray 05/27/2008.  FINDINGS: Prior sternotomy for CABG. Cardiac silhouette moderately enlarged but stable. Suboptimal inspiration accounts for crowded bronchovascular markings at the lung bases. Stable moderate central  peribronchial thickening. Lungs otherwise clear. No localized airspace consolidation. No pleural effusions. No pneumothorax. Normal pulmonary vascularity. Borderline gaseous distention of the visualized colon and multiple loops of small bowel in the visualized upper abdomen, with air-fluid levels. Mild degenerative changes throughout the thoracic spine.  IMPRESSION: 1. No acute cardiopulmonary disease. 2. Stable moderate cardiomegaly without pulmonary edema. Stable moderate changes chronic bronchitis and/or asthma. 3. Bowel gas pattern in the visualized upper abdomen consistent with postoperative ileus.   Electronically Signed   By: Hulan Saas M.D.   On: 01/21/2013 07:11   Ct Angio Chest Pe W/cm &/or Wo Cm  01/21/2013   CLINICAL DATA:  Four days postop ostomy  revision and parastomal hernia repair in this patient with personal history of rectal cancer post low AP resection in April, 2014. Cough. Chest congestion. Diaphoresis. Body aches. Chills. Erythema surrounding the ostomy.  EXAM: CT ANGIOGRAPHY CHEST WITH CONTRAST  TECHNIQUE: Multidetector CT imaging of the chest was performed using the standard protocol during bolus administration of intravenous contrast. Multiplanar CT image reconstructions including MIPs were obtained to evaluate the vascular anatomy.  CONTRAST:  OMNIPAQUE IOHEXOL 350 MG/ML IV. (This was also administered for the CT abdomen and pelvis reported separately).  COMPARISON:  PET-CT 05/15/2012.  CT chest 04/26/2012.  FINDINGS: Contrast opacification of the pulmonary arteries is moderate. Respiratory motion blurred images of the lower thorax. Overall, the study is of moderate to good diagnostic quality.  No filling defects within either main pulmonary artery or their branches in either lung to suggest pulmonary embolism. Heart enlarged with left ventricular predominance and mild left ventricular hypertrophy, unchanged. Prior sternotomy for CABG. Extensive 3 vessel coronary  atherosclerosis. No pericardial effusion. Moderate atherosclerosis involving the thoracic aorta without evidence of aneurysm or dissection. Proximal great vessels widely patent.  Pulmonary parenchyma clear without localized airspace consolidation, interstitial disease, or parenchymal nodules or masses. Central airways patent with moderate to marked central peribronchial thickening. No pleural effusions. Biapical pleural thickening, more so than on the prior examination.  Scattered normal sized lymph nodes throughout the mediastinum. No significant mediastinal, hilar, or axillary lymphadenopathy. Visualized thyroid gland unremarkable.  Bone window images demonstrate mild degenerative changes involving the mid and lower thoracic spine.  Review of the MIP images confirms the above findings.  IMPRESSION: 1. No evidence of pulmonary embolism. 2. Moderate to marked central bronchial wall thickening consistent with bronchitis and/or asthma. No acute cardiopulmonary disease otherwise. 3. Biapical pleural thickening, progressive since the examination 9 months ago. 4. Stable cardiomegaly with left ventricular enlargement and left ventricular hypertrophy.   Electronically Signed   By: Hulan Saas M.D.   On: 01/21/2013 09:48   Ct Abdomen Pelvis W Contrast  01/21/2013   ADDENDUM REPORT: 01/21/2013 10:27  ADDENDUM: In speaking with the surgeon caring for the patient on 01/21/2013 at 1025 hr, she states that the patient does not have a white count or fever and is not acting as though this is an infected fluid collection in the ostomy site. Therefore, likely represents post-operative fluid with post-operative edema elsewhere in the ostomy.   Electronically Signed   By: Hulan Saas M.D.   On: 01/21/2013 10:27   01/21/2013   CLINICAL DATA:  Four days postop ostomy revision and parastomal hernia repair in this patient with personal history of rectal cancer post low AP resection in April, 2014. Cough. Chest congestion.  Diaphoresis. Body aches. Chills. Erythema surrounding the ostomy.  EXAM: CT ABDOMEN AND PELVIS WITH CONTRAST  TECHNIQUE: Multidetector CT imaging of the abdomen and pelvis was performed using the standard protocol following bolus administration of intravenous contrast.  CONTRAST:  OMNIPAQUE IOHEXOL 350 MG/ML IV (also administered for the CTA chest reported separately). Oral contrast was also administered.  COMPARISON:  01/09/2013, 04/26/2012.  PET-CT 4/15/1,014.  FINDINGS: Edema/inflammation within the ostomy in the left lower quadrant, with an approximate 4.5 x 8.5 x 3.8 cm fluid collection in the lateral aspect of the ostomy site in the anterior abdominal wall, containing small gas bubbles. This fluid collection is immediately anterior to the lateral aspect of the divided left rectus muscle. There is edema/inflammation involving the muscle as well. The descending colon within the ostomy  is decompressed. There is no evidence of intraperitoneal edema, inflammation, or abscess.  Normal appearing stomach and small bowel. Prior low AP rectal resection. Entire colon normal in appearance. Normal decompressed appendix in the right mid and low pelvis. No ascites.  Diffuse hepatic steatosis without focal hepatic parenchymal abnormality to suggest metastasis. Normal-appearing spleen with small focus of accessory splenic tissue medial to the spleen below the hilum. Moderate pancreatic atrophy, unchanged. Normal adrenal glands and kidneys. Gallbladder unremarkable by CT. No biliary ductal dilation. Moderate to severe aortoiliofemoral atherosclerosis without aneurysm or dissection. No significant lymphadenopathy.  Presacral soft tissue thickening, likely post radiation change and/or post-surgical scarring related to the prior rectal resection. Urinary bladder decompressed and unremarkable. Prostate gland normal in size for age, containing calcifications.  Bone window images unremarkable.  IMPRESSION: 1. Edema/inflammation  involving the ostomy in the left lower quadrant, with evidence of an abscess in the lateral aspect of the ostomy site in the abdominal wall, measured above. 2. No evidence of intraperitoneal edema, inflammation, or abscess. 3. Diffuse hepatic steatosis without evidence of hepatic metastasis.  Electronically Signed: By: Hulan Saas M.D. On: 01/21/2013 10:00    EKG Interpretation    Date/Time:  Monday January 21 2013 07:26:43 EST Ventricular Rate:  124 PR Interval:  73 QRS Duration: 98 QT Interval:  345 QTC Calculation: 495 R Axis:   83 Text Interpretation:  Sinus tachycardia with irregular rate Artifact Borderline T abnormalities, diffuse leads Borderline prolonged QT interval Confirmed by [facility]  MD, KATHLEEN ([id number]) on 01/21/2013 7:52:15 AM            MDM   1. Fever   2. Bronchitis   3. Acute respiratory failure with hypoxia   4. CAD (coronary artery disease) of artery bypass graft   5. Cellulitis and abscess of trunk   6. COPD exacerbation   7. Sepsis   8. Cellulitis of abdominal wall     CXR shows no acute cardiopulmonary disease. Bowel gas pattern of upper abdomen consistent with post operative ileus. Hemoccult positive. CT shows evidence of abscess in lateral aspect of ostomy site. Plan to admit patient. Patient discussed with Dr. Samuel Jester.       Rudene Anda, PA-C 01/21/13 [phone]

## 2013-01-21 NOTE — H&P (Signed)
Cody Coleman is a 67 y.o. M that is well known to me.  He is s/p an APR for distal rectal cancer.  He underwent a non-complicated colostomy revision on 12/18.  He has had some difficulty getting his colostomy to function after the procedure, but was able to have some progression with mag citrate.  During this time he has been coughing up copious amounts of sputum, some of which was blood tinged.  Early this morning he spiked a fever to 102.9 and developed erythema throughout his lower abdomen.   Past Medical History  Diagnosis Date  . CAD (coronary artery disease)   . High cholesterol   . Urgency-frequency syndrome     FLOMAX HAS HELPED  . MI (myocardial infarction)     x 2  AT AGE 40 AND AT AGE 59  DR. [facility] IS PT'S MEDICAL DOCTOR  . Cancer     RECTAL CANCER--SOME RECTAL BLEEDING  . Pain     RIGHT KNEE PAIN AND OCCAS OTHER JOINT PAINS  . Sleep difficulties     NEVER SLEEPS WELL - WAKES UP AFTER SEVERAL HOURS AND NOT ABLE TO GO BACK TO SLEEP--DID SLEEP STUDY AND TOLD HE DID NOT HAVE SLEEP APNEA  . Former tobacco use   . BPH (benign prostatic hyperplasia)    Past Surgical History  Procedure Laterality Date  . Coronary artery bypass graft      2005  . Tonsillectomy    . Coronary angioplasty with stent placement      before bypass  . Colonoscopy N/A 04/20/2012    Procedure: COLONOSCOPY;  Surgeon: Malissa Hippo, MD;  [facility];  Service: Endoscopy;  Laterality: N/A;  225  . Eus N/A 05/03/2012    Procedure: LOWER ENDOSCOPIC ULTRASOUND (EUS);  Surgeon: Rachael Fee, MD;  Location: [facility];  Service: Endoscopy;  Laterality: N/A;  . Laparoscopic assisted abdominal perineal resection N/A 06/11/2012    Procedure: LAPAROSCOPIC ASSISTED ABDOMINAL PERINEAL RESECTION;  Surgeon: Romie Levee, MD;  Location: [facility];  Service: General;  Laterality: N/A;   History   Social History  . Marital Status: Married    Spouse Name: N/A    Number of Children: N/A  . Years of  Education: N/A   Occupational History  . Not on file.   Social History Main Topics  . Smoking status: Former Smoker -- 1.00 packs/day for 30 years    Types: Cigarettes    Quit date: 01/31/2002  . Smokeless tobacco: Never Used     Comment: quit 15yrs ago  . Alcohol Use: 15.0 oz/week    25 Shots of liquor per week     Comment: drinks 2-3 drinks on occasion  . Drug Use: No  . Sexual Activity: No   Other Topics Concern  . Not on file   Social History Narrative   Lives with wife.  Drives, no assist devices.   Family History  Problem Relation Age of Onset  . Colon cancer Neg Hx   . Congestive Heart Failure Mother   . Stroke Father   . Coronary artery disease Brother   . Coronary artery disease Father      Medication List           ALPRAZolam 1 MG tablet  Commonly known as:  XANAX  Take 1 mg by mouth 3 (three) times daily as needed for sleep or anxiety. USUALLY WOULD ONLY TAKE 1/2 TAB IF NEEDED FOR SLEEP OR ANXIETY  aspirin 325 MG tablet  Take 1 tablet (325 mg total) by mouth every morning.     atorvastatin 20 MG tablet  Commonly known as:  LIPITOR  Take 20 mg by mouth daily.     docusate sodium 100 MG capsule  Commonly known as:  COLACE  Take 100 mg by mouth as needed for constipation.     ezetimibe 10 MG tablet  Commonly known as:  ZETIA  Take 10 mg by mouth every morning.     hydrocortisone 2.5 % lotion  Apply to affected area 2 times daily     ibuprofen 200 MG tablet  Commonly known as:  ADVIL,MOTRIN  Take 600 mg by mouth every 6 (six) hours as needed.     oxyCODONE 5 MG immediate release tablet  Commonly known as:  Oxy IR/ROXICODONE  Take 1-2 tablets (5-10 mg total) by mouth every 6 (six) hours as needed.     tamsulosin 0.4 MG Caps capsule  Commonly known as:  FLOMAX  Take 0.4 mg by mouth daily after breakfast.       Allergies  Allergen Reactions  . Augmentin [Amoxicillin-Pot Clavulanate] Swelling and Rash   Review of Systems - General ROS:  positive for  - chills, fatigue and fever negative for - weight loss ENT ROS: positive for - nasal congestion, sneezing and sore throat negative for - sinus pain Hematological and Lymphatic ROS: negative for - bleeding problems, blood clots or bruising Respiratory ROS: positive for - cough, shortness of breath and wheezing negative for - pleuritic pain Cardiovascular ROS: no chest pain or dyspnea on exertion Gastrointestinal ROS: positive for - abdominal pain and constipation negative for - blood in stools or nausea/vomiting Genito-Urinary ROS: no dysuria, trouble voiding, or hematuria  Exam Filed Vitals:   01/21/13 1142  BP: 104/72  Pulse:   Temp: 97.1 F (36.2 C)  Resp: 18   Gen: moderate distress Lungs: wheezes, bilateral CV: tachycardic Abd: soft, non-tender, non-distended.  Lower abd blanching erythema Ostomy: beefy red  Assessment/Plan: Cellulitis: cont broad spectrum antibiotics.  Will monitor closely. Hypoxia: per primary team, pt seems to at least have upper respiratory inflammation.  CT PE study neg Post operative state: ok to start full liquids, colon decompressed.  No surgical interventions at this time.  Can advance diet as tolerated

## 2013-01-21 NOTE — ED Notes (Signed)
Pt skin cool and clammy, per wife pt did have complication of coughing during surgery per MD, pt splinting abd when coughing. Pts wife states she gave pt neb tx at home (her nebulizer).

## 2013-01-21 NOTE — Progress Notes (Signed)
   CARE MANAGEMENT ED NOTE 01/21/2013  Patient:  Cody Coleman, Cody Coleman   Account Number:  [id number]  Date Initiated:  01/21/2013  Documentation initiated by:  Dennis Bast Assessment:   67 yr old male [facility] pt,  afebrile, heart rate 140, respirations 20, blood pressure 92/69, oxygen saturation 91% on room air. wbc5, hemoglobin 13.2, platelets 153, sodi  bp in ED 92/69 given ns bolus dx sepsis     Subjective/Objective Assessment Detail:     Action/Plan:   UR completed   Action/Plan Detail:   Anticipated DC Date:  01/24/2013     Status Recommendation to Physician:   Result of Recommendation:    Other ED Services  Consult Working Plan    DC Planning Services  Other    Choice offered to / List presented to:            Status of service:  Completed, signed off  ED Comments:   ED Comments Detail:

## 2013-01-21 NOTE — ED Provider Notes (Signed)
Medical screening examination/treatment/procedure(s) were conducted as a shared visit with non-physician practitioner(s) and myself.  I personally evaluated the patient during the encounter.    0800:  Pt received at change of shift. 67yoM, c/o gradual onset and persistence of constant cough for the past 2 days. Has been associated with home fevers to "102," chills, generalized body aches/fatigue. Pt's wife states he has been intermittently "wheezing" at home, and she has been dosing him with her nebulizer treatments with relief of cough and wheezing. Pt states he has felt "constipated," took laxative with relief. Pt denies CP/palpitations, no abd pain, no N/V/D, no back pain. Denies blood/black stools from ostomy site. Significant hx of recent colostomy revision on 01/17/13 by Dr. Maisie Fus. SBP 80-90, HR 140, monitor sinus tachycardia/no ectopy. Afebrile by PO temp (cannot perform rectal temp due to previous rectal surgery). NAD, A&O; lungs coarse with exp wheezing bilat, no retrax or access mm use; abd softly distended and tympanitic, decreased bowel sounds, +air in colostomy bag, no blood, stoma pink/moist, +warmth and erythema to lower left abd wall, no ecchymosis, no blisters, no soft tissue crepitus; no calf tenderness, edema, or asymmetry bilat; neuro non-focal.  WBC and lactic acid normal. AXR with ileus, no pulmonary infiltrate, Udip without infection.   0815:  Neb treatment given in ED with improvement in wheezing, lungs CTA bilat, and pt states he "feels better." Tachycardia slowly improving to 100-110's, SBP slowly improving to 100's after 1st IVF bolus; will start 2nd. T/C to General Surgery Dr. Ezzard Standing, case discussed, including:  HPI, pertinent PM/SHx, VS/PE, dx testing, ED course and treatment:  Requests to call pt's surgeon Dr. Maisie Fus. T/C to General Surgery Dr. Maisie Fus, case discussed, including:  HPI, pertinent PM/SHx, VS/PE, dx testing, ED course and treatment:  States she did not enter the abd  cavity during her surgery, requests to obtain CT A/P and call back.   1020:  CT C/A/P completed: No PE or pneumonia; +abd wall cellulitis and abscess; will start IV abx. Will also obtain flu panel. Dx and testing d/w pt and family.  Questions answered.  Verb understanding, agreeable to admit.  T/C to General Surgery PA working with Dr. Maisie Fus, case discussed, including:  HPI, pertinent PM/SHx, VS/PE, dx testing, ED course and treatment:  Agreeable to admit, requests she will come to ED for eval.   1035:  T/C from General Surgeon Dr. Maisie Fus, case discussed, including:  HPI, pertinent PM/SHx, VS/PE, dx testing, ED course and treatment:  States the fluid collection is NOT an abscess and pt does not need abx because he does not have a fever and his WBC count is normal, pt's symptoms are not a surgical issue and pt can be discharged, admit to medicine service if feel the pt needs to be admitted and she can consult later today prn.   1050:  T/C to [facility] Dr. Malachi Bonds, case discussed, including:  HPI, pertinent PM/SHx, VS/PE, dx testing, ED course and treatment, as well as d/w General Surgeon:  Agreeable to admit, requests to write temporary orders, obtain tele bed to team 8.   CRITICAL CARE Performed by: Laray Anger Total critical care time: 45 Critical care time was exclusive of separately billable procedures and treating other patients. Critical care was necessary to treat or prevent imminent or life-threatening deterioration. Critical care was time spent personally by me on the following activities: development of treatment plan with patient and/or surrogate as well as nursing, discussions with consultants, evaluation of patient's response to treatment, examination of  patient, obtaining history from patient or surrogate, ordering and performing treatments and interventions, ordering and review of laboratory studies, ordering and review of radiographic studies, pulse oximetry and re-evaluation of  patient's condition.   Results for orders placed during the hospital encounter of 01/21/13  CBC WITH DIFFERENTIAL      Result Value Range   WBC 5.0  4.0 - 10.5 K/uL   RBC 3.98 (*) 4.22 - 5.81 MIL/uL   Hemoglobin 13.2  13.0 - 17.0 g/dL   HCT 16.1 (*) 09.6 - 04.5 %   MCV 96.0  78.0 - 100.0 fL   MCH 33.2  26.0 - 34.0 pg   MCHC 34.6  30.0 - 36.0 g/dL   RDW 40.9  81.1 - 91.4 %   Platelets 153  150 - 400 K/uL   Neutrophils Relative % 79 (*) 43 - 77 %   Neutro Abs 3.9  1.7 - 7.7 K/uL   Lymphocytes Relative 5 (*) 12 - 46 %   Lymphs Abs 0.2 (*) 0.7 - 4.0 K/uL   Monocytes Relative 15 (*) 3 - 12 %   Monocytes Absolute 0.7  0.1 - 1.0 K/uL   Eosinophils Relative 2  0 - 5 %   Eosinophils Absolute 0.1  0.0 - 0.7 K/uL   Basophils Relative 0  0 - 1 %   Basophils Absolute 0.0  0.0 - 0.1 K/uL  BASIC METABOLIC PANEL      Result Value Range   Sodium 134 (*) 135 - 145 mEq/L   Potassium 3.9  3.5 - 5.1 mEq/L   Chloride 96  96 - 112 mEq/L   CO2 26  19 - 32 mEq/L   Glucose, Bld 131 (*) 70 - 99 mg/dL   BUN 14  6 - 23 mg/dL   Creatinine, Ser 7.82  0.50 - 1.35 mg/dL   Calcium 8.7  8.4 - 95.6 mg/dL   GFR calc non Af Amer >90  >90 mL/min   GFR calc Af Amer >90  >90 mL/min  URINALYSIS W MICROSCOPIC + REFLEX CULTURE      Result Value Range   Color, Urine AMBER (*) YELLOW   APPearance CLEAR  CLEAR   Specific Gravity, Urine 1.022  1.005 - 1.030   pH 7.5  5.0 - 8.0   Glucose, UA NEGATIVE  NEGATIVE mg/dL   Hgb urine dipstick NEGATIVE  NEGATIVE   Bilirubin Urine SMALL (*) NEGATIVE   Ketones, ur 15 (*) NEGATIVE mg/dL   Protein, ur 30 (*) NEGATIVE mg/dL   Urobilinogen, UA 1.0  0.0 - 1.0 mg/dL   Nitrite NEGATIVE  NEGATIVE   Leukocytes, UA NEGATIVE  NEGATIVE   WBC, UA 0-2  <3 WBC/hpf   Urine-Other MUCOUS PRESENT    TROPONIN I      Result Value Range   Troponin I <0.30  <0.30 ng/mL  CG4 I-STAT (LACTIC ACID)      Result Value Range   Lactic Acid, Venous 0.93  0.5 - 2.2 mmol/L  OCCULT BLOOD, POC DEVICE       Result Value Range   Fecal Occult Bld POSITIVE (*) NEGATIVE   Dg Chest 2 View 01/21/2013   CLINICAL DATA:  Cough. chest congestion. Former smoker. Prior CABG. Personal history of rectal carcinoma. Four days post repair of parastomal hernia and colostomy prolapse.  EXAM: CHEST  2 VIEW  COMPARISON:  PET-CT 05/15/2012. CT chest 04/26/2012. One view chest x-ray 05/27/2008.  FINDINGS: Prior sternotomy for CABG. Cardiac silhouette moderately enlarged but stable. Suboptimal  inspiration accounts for crowded bronchovascular markings at the lung bases. Stable moderate central peribronchial thickening. Lungs otherwise clear. No localized airspace consolidation. No pleural effusions. No pneumothorax. Normal pulmonary vascularity. Borderline gaseous distention of the visualized colon and multiple loops of small bowel in the visualized upper abdomen, with air-fluid levels. Mild degenerative changes throughout the thoracic spine.  IMPRESSION: 1. No acute cardiopulmonary disease. 2. Stable moderate cardiomegaly without pulmonary edema. Stable moderate changes chronic bronchitis and/or asthma. 3. Bowel gas pattern in the visualized upper abdomen consistent with postoperative ileus.   Electronically Signed   By: Hulan Saas M.D.   On: 01/21/2013 07:11   Ct Angio Chest Pe W/cm &/or Wo Cm 01/21/2013   CLINICAL DATA:  Four days postop ostomy revision and parastomal hernia repair in this patient with personal history of rectal cancer post low AP resection in April, 2014. Cough. Chest congestion. Diaphoresis. Body aches. Chills. Erythema surrounding the ostomy.  EXAM: CT ANGIOGRAPHY CHEST WITH CONTRAST  TECHNIQUE: Multidetector CT imaging of the chest was performed using the standard protocol during bolus administration of intravenous contrast. Multiplanar CT image reconstructions including MIPs were obtained to evaluate the vascular anatomy.  CONTRAST:  OMNIPAQUE IOHEXOL 350 MG/ML IV. (This was also administered  for the CT abdomen and pelvis reported separately).  COMPARISON:  PET-CT 05/15/2012.  CT chest 04/26/2012.  FINDINGS: Contrast opacification of the pulmonary arteries is moderate. Respiratory motion blurred images of the lower thorax. Overall, the study is of moderate to good diagnostic quality.  No filling defects within either main pulmonary artery or their branches in either lung to suggest pulmonary embolism. Heart enlarged with left ventricular predominance and mild left ventricular hypertrophy, unchanged. Prior sternotomy for CABG. Extensive 3 vessel coronary atherosclerosis. No pericardial effusion. Moderate atherosclerosis involving the thoracic aorta without evidence of aneurysm or dissection. Proximal great vessels widely patent.  Pulmonary parenchyma clear without localized airspace consolidation, interstitial disease, or parenchymal nodules or masses. Central airways patent with moderate to marked central peribronchial thickening. No pleural effusions. Biapical pleural thickening, more so than on the prior examination.  Scattered normal sized lymph nodes throughout the mediastinum. No significant mediastinal, hilar, or axillary lymphadenopathy. Visualized thyroid gland unremarkable.  Bone window images demonstrate mild degenerative changes involving the mid and lower thoracic spine.  Review of the MIP images confirms the above findings.  IMPRESSION: 1. No evidence of pulmonary embolism. 2. Moderate to marked central bronchial wall thickening consistent with bronchitis and/or asthma. No acute cardiopulmonary disease otherwise. 3. Biapical pleural thickening, progressive since the examination 9 months ago. 4. Stable cardiomegaly with left ventricular enlargement and left ventricular hypertrophy.   Electronically Signed   By: Hulan Saas M.D.   On: 01/21/2013 09:48   Ct Abdomen Pelvis W Contrast 01/21/2013   CLINICAL DATA:  Four days postop ostomy revision and parastomal hernia repair in this  patient with personal history of rectal cancer post low AP resection in April, 2014. Cough. Chest congestion. Diaphoresis. Body aches. Chills. Erythema surrounding the ostomy.  EXAM: CT ABDOMEN AND PELVIS WITH CONTRAST  TECHNIQUE: Multidetector CT imaging of the abdomen and pelvis was performed using the standard protocol following bolus administration of intravenous contrast.  CONTRAST:  OMNIPAQUE IOHEXOL 350 MG/ML IV (also administered for the CTA chest reported separately). Oral contrast was also administered.  COMPARISON:  01/09/2013, 04/26/2012.  PET-CT 4/15/1,014.  FINDINGS: Edema/inflammation within the ostomy in the left lower quadrant, with an approximate 4.5 x 8.5 x 3.8 cm fluid collection in the  lateral aspect of the ostomy site in the anterior abdominal wall, containing small gas bubbles. This fluid collection is immediately anterior to the lateral aspect of the divided left rectus muscle. There is edema/inflammation involving the muscle as well. The descending colon within the ostomy is decompressed. There is no evidence of intraperitoneal edema, inflammation, or abscess.  Normal appearing stomach and small bowel. Prior low AP rectal resection. Entire colon normal in appearance. Normal decompressed appendix in the right mid and low pelvis. No ascites.  Diffuse hepatic steatosis without focal hepatic parenchymal abnormality to suggest metastasis. Normal-appearing spleen with small focus of accessory splenic tissue medial to the spleen below the hilum. Moderate pancreatic atrophy, unchanged. Normal adrenal glands and kidneys. Gallbladder unremarkable by CT. No biliary ductal dilation. Moderate to severe aortoiliofemoral atherosclerosis without aneurysm or dissection. No significant lymphadenopathy.  Presacral soft tissue thickening, likely post radiation change and/or post-surgical scarring related to the prior rectal resection. Urinary bladder decompressed and unremarkable. Prostate gland normal in  size for age, containing calcifications.  Bone window images unremarkable.  IMPRESSION: 1. Edema/inflammation involving the ostomy in the left lower quadrant, with evidence of an abscess in the lateral aspect of the ostomy site in the abdominal wall, measured above. 2. No evidence of intraperitoneal edema, inflammation, or abscess. 3. Diffuse hepatic steatosis without evidence of hepatic metastasis.   Electronically Signed   By: Hulan Saas M.D.   On: 01/21/2013 10:00     EKG Interpretation    Date/Time:  Monday January 21 2013 07:26:43 EST Ventricular Rate:  124 PR Interval:  73 QRS Duration: 98 QT Interval:  345 QTC Calculation: 495 R Axis:   83 Text Interpretation:  Sinus tachycardia with irregular rate Artifact Borderline T abnormalities, diffuse leads Borderline prolonged QT interval Confirmed by [facility]  MD, Kyce Ging [phone]) on 01/21/2013 7:52:15 AM              Laray Anger, DO 01/22/13 1000

## 2013-01-22 ENCOUNTER — Encounter (HOSPITAL_COMMUNITY): Payer: Self-pay | Admitting: General Surgery

## 2013-01-22 DIAGNOSIS — J111 Influenza due to unidentified influenza virus with other respiratory manifestations: Secondary | ICD-10-CM

## 2013-01-22 DIAGNOSIS — J40 Bronchitis, not specified as acute or chronic: Secondary | ICD-10-CM

## 2013-01-22 LAB — CBC
HCT: 35.6 % — ABNORMAL LOW (ref 39.0–52.0)
Hemoglobin: 11.8 g/dL — ABNORMAL LOW (ref 13.0–17.0)
MCH: 32 pg (ref 26.0–34.0)
MCHC: 33.1 g/dL (ref 30.0–36.0)
MCV: 96.5 fL (ref 78.0–100.0)
RDW: 14 % (ref 11.5–15.5)

## 2013-01-22 LAB — BASIC METABOLIC PANEL
BUN: 11 mg/dL (ref 6–23)
Calcium: 8.2 mg/dL — ABNORMAL LOW (ref 8.4–10.5)
Chloride: 99 mEq/L (ref 96–112)
Creatinine, Ser: 0.8 mg/dL (ref 0.50–1.35)
GFR calc Af Amer: >90 mL/min (ref >90)
GFR calc non Af Amer: >90 mL/min (ref >90)
Glucose, Bld: 132 mg/dL — ABNORMAL HIGH (ref 70–99)
Potassium: 4.3 mEq/L (ref 3.5–5.1)

## 2013-01-22 MED ORDER — CEFPODOXIME PROXETIL 200 MG PO TABS
400.0000 mg | ORAL_TABLET | Freq: Two times a day (BID) | ORAL | Status: DC
  Administered 2013-01-22 – 2013-01-23 (×3): 400 mg via ORAL
  Filled 2013-01-22 (×4): qty 2

## 2013-01-22 MED ORDER — METRONIDAZOLE 500 MG PO TABS
500.0000 mg | ORAL_TABLET | Freq: Three times a day (TID) | ORAL | Status: DC
  Administered 2013-01-22 – 2013-01-23 (×4): 500 mg via ORAL
  Filled 2013-01-22 (×6): qty 1

## 2013-01-22 NOTE — Progress Notes (Signed)
[facility] PROGRESS NOTE  Cody Coleman ZOX:[id number] DOB: January 28, 1946 DOA: 01/21/2013 PCP: Kirk Ruths, MD  Interim Summary Patient is a pleasant 67 year old gentleman with a past medical history of distal rectal cancer undergoing complicated colostomy revision on 01/17/2013. Post procedure there was some difficulty getting colostomy to function however this improved with the administration of magnesium citrate. During this time he had been having increased cough with sputum production, developing low-grade temperatures at home. He presented to the emergency department on 01/21/2013 with complaints of fever, chills, erythema and swelling over colostomy site. In the emergency department he was found to have erythema spreading laterally across his abdomen.  A CT scan of abdomen performed on 01/21/2013 showed edema and inflammation involving the ostomy in the left lower quadrant with evidence of an abscess in the lateral aspect of the ostomy site in the abdominal wall. Patient was started on broad-spectrum IV antibiotic therapy with imipenem and vancomycin. He was seen and evaluated by Dr. Maisie Fus of general surgery who did not feel that he required surgical intervention at this point. By 01/22/2013 be doing better with improvement to his cellulitis. Dr. Maisie Fus recommended transitioning to oral antibiotic therapy with Augmentin and Flagyl. Patient reported having a history of swelling and rash to Augmentin therapy. Discussed case with pharmacy, he was started on oral third generation cephalosporin. With regard to his cough and shortness of breath, likely secondary to influenza, as he came back influenza A positive. Patient started on Tamiflu.                                                                                       Assessment/Plan: 1. Sepsis, present on admission, evidenced by a temperature 102.9, heart rate of 140, respiratory to 22. Likely source of infection abdominal wall  cellulitis. Patient was started on IV antibiotic therapy with vancomycin and imipenem, transitioning to oral antibiotics today per surgery's recommendations. Patient showing improvement. 2. Abdominal wall cellulitis. Improved on this mornings lab work. Discussed case with general surgery, will transition to oral third cephalosporin therapy (history of PCN allergy) and Flagyl. Blood cultures obtained on 01/21/2013 showing no growth x2 sets. Continue supportive care. 3. Acute hypoxemic respiratory failure evidence by respiratory distress. Suspect secondary to influenza, patient come back and we'll and a positive. Improved this morning, continue supportive care, when necessary Xopenex. He had a CT scan of lungs with IV contrast which did not show evidence of pulmonary embolism. 4. Influenza A. Patient was started on Tamiflu. Seems improved today from yesterday. 5. Sinus tachycardia. Likely secondary to underlying pulmonary infectious process as well as sepsis from abdominal wall infection. Heart rates improved on this mornings vitals. 6. History of rectal cancer. General surgery consulted.  Code Status: Full Code Family Communication: I spoke with his wife at bedside Disposition Plan: Transitioning to oral antibiotic therapy today.    Consultants:  General Surgery   Antibiotics:  Imipenem (started on 01/21/2013, discontinued on 01/22/2013)  Vancomycin (started on 01/21/2013 discontinued on 01/22/2013)  Cefpodoxime (started on 01/22/2013)  Flagyl (started on 01/22/2013)  HPI/Subjective: Patient states feeling better this morning, with improvement to abdominal wall colitis. He is tolerating by mouth  intake, denies nausea vomiting fevers or chills  Objective: Filed Vitals:   01/22/13 0454  BP: 129/76  Pulse: 99  Temp: 98.2 F (36.8 C)  Resp: 22    Intake/Output Summary (Last 24 hours) at 01/22/13 1710 Last data filed at 01/22/13 1014  Gross per 24 hour  Intake   1460 ml  Output     875 ml  Net    585 ml   Filed Weights   01/21/13 0629 01/21/13 1328 01/22/13 0454  Weight: 90.719 kg (200 lb) 90 kg (198 lb 6.6 oz) 91.309 kg (201 lb 4.8 oz)    Exam:   General:  Patient is nontoxic appearing, no acute distress awake alert oriented  Cardiovascular: Regular rate rhythm normal S1-S2  Respiratory: Has a few bilateral expiratory wheezes, normal respiratory effort  Abdomen: There is improvement to abdominal wall erythema and swelling, no pain to palpation  Musculoskeletal: Preserved range of motion to all extremities no edema  Data Reviewed: Basic Metabolic Panel:  Recent Labs Lab 01/16/13 0829 01/21/13 0650 01/22/13 0532  NA 140 134* 135  K 4.0 3.9 4.3  CL 100 96 99  CO2 28 26 27   GLUCOSE 109* 131* 132*  BUN 14 14 11   CREATININE 0.73 0.79 0.80  CALCIUM 9.0 8.7 8.2*   Liver Function Tests:  Recent Labs Lab 01/16/13 0829  AST 63*  ALT 57*  ALKPHOS 66  BILITOT 1.3*  PROT 7.2  ALBUMIN 4.1   No results found for this basename: LIPASE, AMYLASE,  in the last 168 hours No results found for this basename: AMMONIA,  in the last 168 hours CBC:  Recent Labs Lab 01/16/13 0829 01/21/13 0650 01/22/13 0532  WBC 4.8 5.0 4.7  NEUTROABS  --  3.9  --   HGB 15.2 13.2 11.8*  HCT 43.0 38.2* 35.6*  MCV 94.7 96.0 96.5  PLT 138* 153 137*   Cardiac Enzymes:  Recent Labs Lab 01/21/13 0817  TROPONINI <0.30   BNP (last 3 results)  Recent Labs  01/21/13 0650  PROBNP 2189.0*   CBG: No results found for this basename: GLUCAP,  in the last 168 hours  Recent Results (from the past 240 hour(s))  CULTURE, BLOOD (ROUTINE X 2)     Status: None   Collection Time    01/21/13  6:50 AM      Result Value Range Status   Specimen Description BLOOD RIGHT HAND   Final   Special Requests BOTTLES DRAWN AEROBIC AND ANAEROBIC   Final   Culture  Setup Time     Final   Value: 01/21/2013 12:37     Performed at [facility]   Culture     Final   Value:         BLOOD CULTURE RECEIVED NO GROWTH TO DATE CULTURE WILL BE HELD FOR 5 DAYS BEFORE ISSUING A FINAL NEGATIVE REPORT     Performed at [facility]   Report Status PENDING   Incomplete  CULTURE, BLOOD (ROUTINE X 2)     Status: None   Collection Time    01/21/13  7:49 AM      Result Value Range Status   Specimen Description BLOOD LEFT ARM   Final   Special Requests BOTTLES DRAWN AEROBIC AND ANAEROBIC EACH   Final   Culture  Setup Time     Final   Value: 01/21/2013 12:37     Performed at [facility]  Final   Value:        BLOOD CULTURE RECEIVED NO GROWTH TO DATE CULTURE WILL BE HELD FOR 5 DAYS BEFORE ISSUING A FINAL NEGATIVE REPORT     Performed at [facility]   Report Status PENDING   Incomplete     Studies: Dg Chest 2 View  01/21/2013   CLINICAL DATA:  Cough. chest congestion. Former smoker. Prior CABG. Personal history of rectal carcinoma. Four days post repair of parastomal hernia and colostomy prolapse.  EXAM: CHEST  2 VIEW  COMPARISON:  PET-CT 05/15/2012. CT chest 04/26/2012. One view chest x-ray 05/27/2008.  FINDINGS: Prior sternotomy for CABG. Cardiac silhouette moderately enlarged but stable. Suboptimal inspiration accounts for crowded bronchovascular markings at the lung bases. Stable moderate central peribronchial thickening. Lungs otherwise clear. No localized airspace consolidation. No pleural effusions. No pneumothorax. Normal pulmonary vascularity. Borderline gaseous distention of the visualized colon and multiple loops of small bowel in the visualized upper abdomen, with air-fluid levels. Mild degenerative changes throughout the thoracic spine.  IMPRESSION: 1. No acute cardiopulmonary disease. 2. Stable moderate cardiomegaly without pulmonary edema. Stable moderate changes chronic bronchitis and/or asthma. 3. Bowel gas pattern in the visualized upper abdomen consistent with postoperative ileus.   Electronically Signed   By: Hulan Saas M.D.   On: 01/21/2013 07:11   Ct Angio Chest Pe W/cm &/or Wo Cm  01/21/2013   CLINICAL DATA:  Four days postop ostomy revision and parastomal hernia repair in this patient with personal history of rectal cancer post low AP resection in April, 2014. Cough. Chest congestion. Diaphoresis. Body aches. Chills. Erythema surrounding the ostomy.  EXAM: CT ANGIOGRAPHY CHEST WITH CONTRAST  TECHNIQUE: Multidetector CT imaging of the chest was performed using the standard protocol during bolus administration of intravenous contrast. Multiplanar CT image reconstructions including MIPs were obtained to evaluate the vascular anatomy.  CONTRAST:  OMNIPAQUE IOHEXOL 350 MG/ML IV. (This was also administered for the CT abdomen and pelvis reported separately).  COMPARISON:  PET-CT 05/15/2012.  CT chest 04/26/2012.  FINDINGS: Contrast opacification of the pulmonary arteries is moderate. Respiratory motion blurred images of the lower thorax. Overall, the study is of moderate to good diagnostic quality.  No filling defects within either main pulmonary artery or their branches in either lung to suggest pulmonary embolism. Heart enlarged with left ventricular predominance and mild left ventricular hypertrophy, unchanged. Prior sternotomy for CABG. Extensive 3 vessel coronary atherosclerosis. No pericardial effusion. Moderate atherosclerosis involving the thoracic aorta without evidence of aneurysm or dissection. Proximal great vessels widely patent.  Pulmonary parenchyma clear without localized airspace consolidation, interstitial disease, or parenchymal nodules or masses. Central airways patent with moderate to marked central peribronchial thickening. No pleural effusions. Biapical pleural thickening, more so than on the prior examination.  Scattered normal sized lymph nodes throughout the mediastinum. No significant mediastinal, hilar, or axillary lymphadenopathy. Visualized thyroid gland unremarkable.  Bone window  images demonstrate mild degenerative changes involving the mid and lower thoracic spine.  Review of the MIP images confirms the above findings.  IMPRESSION: 1. No evidence of pulmonary embolism. 2. Moderate to marked central bronchial wall thickening consistent with bronchitis and/or asthma. No acute cardiopulmonary disease otherwise. 3. Biapical pleural thickening, progressive since the examination 9 months ago. 4. Stable cardiomegaly with left ventricular enlargement and left ventricular hypertrophy.   Electronically Signed   By: Hulan Saas M.D.   On: 01/21/2013 09:48   Ct Abdomen Pelvis W Contrast  01/21/2013   ADDENDUM REPORT: 01/21/2013  10:27  ADDENDUM: In speaking with the surgeon caring for the patient on 01/21/2013 at 1025 hr, she states that the patient does not have a white count or fever and is not acting as though this is an infected fluid collection in the ostomy site. Therefore, likely represents post-operative fluid with post-operative edema elsewhere in the ostomy.   Electronically Signed   By: Hulan Saas M.D.   On: 01/21/2013 10:27   01/21/2013   CLINICAL DATA:  Four days postop ostomy revision and parastomal hernia repair in this patient with personal history of rectal cancer post low AP resection in April, 2014. Cough. Chest congestion. Diaphoresis. Body aches. Chills. Erythema surrounding the ostomy.  EXAM: CT ABDOMEN AND PELVIS WITH CONTRAST  TECHNIQUE: Multidetector CT imaging of the abdomen and pelvis was performed using the standard protocol following bolus administration of intravenous contrast.  CONTRAST:  OMNIPAQUE IOHEXOL 350 MG/ML IV (also administered for the CTA chest reported separately). Oral contrast was also administered.  COMPARISON:  01/09/2013, 04/26/2012.  PET-CT 4/15/1,014.  FINDINGS: Edema/inflammation within the ostomy in the left lower quadrant, with an approximate 4.5 x 8.5 x 3.8 cm fluid collection in the lateral aspect of the ostomy site in the  anterior abdominal wall, containing small gas bubbles. This fluid collection is immediately anterior to the lateral aspect of the divided left rectus muscle. There is edema/inflammation involving the muscle as well. The descending colon within the ostomy is decompressed. There is no evidence of intraperitoneal edema, inflammation, or abscess.  Normal appearing stomach and small bowel. Prior low AP rectal resection. Entire colon normal in appearance. Normal decompressed appendix in the right mid and low pelvis. No ascites.  Diffuse hepatic steatosis without focal hepatic parenchymal abnormality to suggest metastasis. Normal-appearing spleen with small focus of accessory splenic tissue medial to the spleen below the hilum. Moderate pancreatic atrophy, unchanged. Normal adrenal glands and kidneys. Gallbladder unremarkable by CT. No biliary ductal dilation. Moderate to severe aortoiliofemoral atherosclerosis without aneurysm or dissection. No significant lymphadenopathy.  Presacral soft tissue thickening, likely post radiation change and/or post-surgical scarring related to the prior rectal resection. Urinary bladder decompressed and unremarkable. Prostate gland normal in size for age, containing calcifications.  Bone window images unremarkable.  IMPRESSION: 1. Edema/inflammation involving the ostomy in the left lower quadrant, with evidence of an abscess in the lateral aspect of the ostomy site in the abdominal wall, measured above. 2. No evidence of intraperitoneal edema, inflammation, or abscess. 3. Diffuse hepatic steatosis without evidence of hepatic metastasis.  Electronically Signed: By: Hulan Saas M.D. On: 01/21/2013 10:00    Scheduled Meds: . atorvastatin  20 mg Oral Daily  . budesonide (PULMICORT) nebulizer solution  0.5 mg Nebulization Q6H  . cefpodoxime  400 mg Oral Q12H  . dextromethorphan-guaiFENesin  1 tablet Oral BID  . ezetimibe  10 mg Oral q morning - 10a  . ipratropium  0.5 mg  Nebulization Q4H  . levalbuterol  1.25 mg Nebulization Q4H  . metroNIDAZOLE  500 mg Oral Q8H  . oseltamivir  75 mg Oral BID  . sodium chloride  3 mL Intravenous Q12H  . tamsulosin  0.4 mg Oral QPC breakfast   Continuous Infusions: . dextrose 5 % and 0.45 % NaCl with KCl 20 mEq/L 75 mL/hr at 01/22/13 1318    Principal Problem:   Sepsis Active Problems:   CAD (coronary artery disease) of artery bypass graft   Fever   Cellulitis and abscess of trunk   Acute respiratory failure  with hypoxia   COPD exacerbation    Time spent: 35 minutes    Jeralyn Bennett  [facility] Pager [phone]. If 7PM-7AM, please contact night-coverage at [email or web address], password [facility] 01/22/2013, 5:10 PM  LOS: 1 day

## 2013-01-22 NOTE — Progress Notes (Signed)
Patient's heart rate was sustaining in the 130's-140's.  Patient was complaining of unproductive cough during this time.  Patient's oral temp about 8pm last night was was 102.1. Tylenol was giving for fever and tussionex given for cough. Patient's temperature came down to 98.9 around 11pm last night and heart rate is in the lower 100's. Will continue to monitor and report in changes.   Ernesta Amble, RN

## 2013-01-22 NOTE — Progress Notes (Signed)
Influenza  Subjective: Feels much better, coughing a lot last night, tested positive for Flu  Objective: Vital signs in last 24 hours: Temp:  [97.1 F (36.2 C)-102.1 F (38.9 C)] 98.2 F (36.8 C) (12/23 0454) Pulse Rate:  [99-140] 99 (12/23 0454) Resp:  [18-28] 22 (12/23 0454) BP: (104-142)/(64-80) 129/76 mmHg (12/23 0454) SpO2:  [93 %-100 %] 100 % (12/23 0454) FiO2 (%):  [0 %] 0 % (12/22 1335) Weight:  [198 lb 6.6 oz (90 kg)-201 lb 4.8 oz (91.309 kg)] 201 lb 4.8 oz (91.309 kg) (12/23 0454) Last BM Date: 01/21/13 (has colostomy)  Intake/Output from previous day: 12/22 0701 - 12/23 0700 In: 1260 [P.O.:360; I.V.:550; IV Piggyback:350] Out: 525 [Urine:350; Stool:175] Intake/Output this shift:    General appearance: alert and cooperative GI: soft, non-tender; bowel sounds normal; no masses, no organomegaly Skin: abd erythema improved  Lab Results:  Results for orders placed during the hospital encounter of 01/21/13 (from the past 24 hour(s))  TROPONIN I     Status: None   Collection Time    01/21/13  8:17 AM      Result Value Range   Troponin I <0.30  <0.30 ng/mL  URINALYSIS W MICROSCOPIC + REFLEX CULTURE     Status: Abnormal   Collection Time    01/21/13  8:36 AM      Result Value Range   Color, Urine AMBER (*) YELLOW   APPearance CLEAR  CLEAR   Specific Gravity, Urine 1.022  1.005 - 1.030   pH 7.5  5.0 - 8.0   Glucose, UA NEGATIVE  NEGATIVE mg/dL   Hgb urine dipstick NEGATIVE  NEGATIVE   Bilirubin Urine SMALL (*) NEGATIVE   Ketones, ur 15 (*) NEGATIVE mg/dL   Protein, ur 30 (*) NEGATIVE mg/dL   Urobilinogen, UA 1.0  0.0 - 1.0 mg/dL   Nitrite NEGATIVE  NEGATIVE   Leukocytes, UA NEGATIVE  NEGATIVE   WBC, UA 0-2  <3 WBC/hpf   Urine-Other MUCOUS PRESENT    INFLUENZA PANEL BY PCR     Status: Abnormal   Collection Time    01/21/13  1:13 PM      Result Value Range   Influenza A By PCR POSITIVE (*) NEGATIVE   Influenza B By PCR NEGATIVE  NEGATIVE   H1N1 flu by pcr  NOT DETECTED  NOT DETECTED  BASIC METABOLIC PANEL     Status: Abnormal   Collection Time    01/22/13  5:32 AM      Result Value Range   Sodium 135  135 - 145 mEq/L   Potassium 4.3  3.5 - 5.1 mEq/L   Chloride 99  96 - 112 mEq/L   CO2 27  19 - 32 mEq/L   Glucose, Bld 132 (*) 70 - 99 mg/dL   BUN 11  6 - 23 mg/dL   Creatinine, Ser 1.61  0.50 - 1.35 mg/dL   Calcium 8.2 (*) 8.4 - 10.5 mg/dL   GFR calc non Af Amer >90  >90 mL/min   GFR calc Af Amer >90  >90 mL/min  CBC     Status: Abnormal   Collection Time    01/22/13  5:32 AM      Result Value Range   WBC 4.7  4.0 - 10.5 K/uL   RBC 3.69 (*) 4.22 - 5.81 MIL/uL   Hemoglobin 11.8 (*) 13.0 - 17.0 g/dL   HCT 09.6 (*) 04.5 - 40.9 %   MCV 96.5  78.0 - 100.0 fL  MCH 32.0  26.0 - 34.0 pg   MCHC 33.1  30.0 - 36.0 g/dL   RDW 16.1  09.6 - 04.5 %   Platelets 137 (*) 150 - 400 K/uL     Studies/Results Radiology   MEDS, Scheduled . atorvastatin  20 mg Oral Daily  . budesonide (PULMICORT) nebulizer solution  0.5 mg Nebulization Q6H  . dextromethorphan-guaiFENesin  1 tablet Oral BID  . ezetimibe  10 mg Oral q morning - 10a  . imipenem-cilastatin  500 mg Intravenous Q6H  . ipratropium  0.5 mg Nebulization Q4H  . levalbuterol  1.25 mg Nebulization Q4H  . oseltamivir  75 mg Oral BID  . sodium chloride  3 mL Intravenous Q12H  . tamsulosin  0.4 mg Oral QPC breakfast  . vancomycin  1,250 mg Intravenous Q12H     Assessment: Sepsis Flu  Plan: Can advance diet as tolerated Cont antibiotics. can taper to augmentin, flagyl, if just treating abd    LOS: 1 day    Vanita Panda, MD [facility], [place] [phone]   01/22/2013 8:02 AM

## 2013-01-23 LAB — BASIC METABOLIC PANEL
BUN: 6 mg/dL (ref 6–23)
Calcium: 8.4 mg/dL (ref 8.4–10.5)
GFR calc Af Amer: >90 mL/min (ref >90)
GFR calc non Af Amer: >90 mL/min (ref >90)
Glucose, Bld: 115 mg/dL — ABNORMAL HIGH (ref 70–99)
Potassium: 3.8 mEq/L (ref 3.5–5.1)
Sodium: 139 mEq/L (ref 135–145)

## 2013-01-23 LAB — CBC
MCH: 32.9 pg (ref 26.0–34.0)
MCHC: 33.8 g/dL (ref 30.0–36.0)
Platelets: 159 10*3/uL (ref 150–400)
RBC: 3.65 MIL/uL — ABNORMAL LOW (ref 4.22–5.81)

## 2013-01-23 LAB — PRO B NATRIURETIC PEPTIDE: Pro B Natriuretic peptide (BNP): 520.3 pg/mL — ABNORMAL HIGH (ref 0–125)

## 2013-01-23 MED ORDER — POLYETHYLENE GLYCOL 3350 17 G PO PACK
17.0000 g | PACK | Freq: Two times a day (BID) | ORAL | Status: DC
  Administered 2013-01-23: 17 g via ORAL
  Filled 2013-01-23 (×2): qty 1

## 2013-01-23 MED ORDER — CEFPODOXIME PROXETIL 200 MG PO TABS: 400.0000 mg | ORAL_TABLET | Freq: Two times a day (BID) | ORAL | Status: DC

## 2013-01-23 MED ORDER — OSELTAMIVIR PHOSPHATE 75 MG PO CAPS: 75.0000 mg | ORAL_CAPSULE | Freq: Two times a day (BID) | ORAL | Status: DC

## 2013-01-23 MED ORDER — DOCUSATE SODIUM 100 MG PO CAPS
100.0000 mg | ORAL_CAPSULE | Freq: Two times a day (BID) | ORAL | Status: DC
  Administered 2013-01-23: 100 mg via ORAL
  Filled 2013-01-23 (×2): qty 1

## 2013-01-23 MED ORDER — LEVALBUTEROL HCL 1.25 MG/0.5ML IN NEBU: 1.2500 mg | INHALATION_SOLUTION | RESPIRATORY_TRACT | Status: DC

## 2013-01-23 MED ORDER — IPRATROPIUM BROMIDE 0.02 % IN SOLN: 0.5000 mg | RESPIRATORY_TRACT | Status: DC

## 2013-01-23 MED ORDER — METRONIDAZOLE 500 MG PO TABS: 500.0000 mg | ORAL_TABLET | Freq: Three times a day (TID) | ORAL | Status: DC

## 2013-01-23 NOTE — Progress Notes (Signed)
Influenza  Subjective: Feels much better, off O2, having ostomy output  Objective: Vital signs in last 24 hours: Temp:  [97.6 F (36.4 C)-98.3 F (36.8 C)] 98 F (36.7 C) (12/24 0458) Pulse Rate:  [79-80] 80 (12/24 0458) Resp:  [18-22] 18 (12/24 0458) BP: (103-110)/(65-77) 110/77 mmHg (12/24 0458) SpO2:  [97 %-99 %] 98 % (12/24 0747) Weight:  [200 lb 2.8 oz (90.8 kg)] 200 lb 2.8 oz (90.8 kg) (12/24 0458) Last BM Date: 01/22/13 (colostomy)  Intake/Output from previous day: 12/23 0701 - 12/24 0700 In: 2995 [P.O.:1120; I.V.:1875] Out: 3675 [Urine:3675] Intake/Output this shift:    General appearance: alert and cooperative GI: soft, non-tender; bowel sounds normal; no masses, no organomegaly Skin: abd erythema improved  Lab Results:  Results for orders placed during the hospital encounter of 01/21/13 (from the past 24 hour(s))  BASIC METABOLIC PANEL     Status: Abnormal   Collection Time    01/23/13  5:46 AM      Result Value Range   Sodium 139  135 - 145 mEq/L   Potassium 3.8  3.5 - 5.1 mEq/L   Chloride 101  96 - 112 mEq/L   CO2 26  19 - 32 mEq/L   Glucose, Bld 115 (*) 70 - 99 mg/dL   BUN 6  6 - 23 mg/dL   Creatinine, Ser 1.61  0.50 - 1.35 mg/dL   Calcium 8.4  8.4 - 09.6 mg/dL   GFR calc non Af Amer >90  >90 mL/min   GFR calc Af Amer >90  >90 mL/min  CBC     Status: Abnormal   Collection Time    01/23/13  5:46 AM      Result Value Range   WBC 3.7 (*) 4.0 - 10.5 K/uL   RBC 3.65 (*) 4.22 - 5.81 MIL/uL   Hemoglobin 12.0 (*) 13.0 - 17.0 g/dL   HCT 04.5 (*) 40.9 - 81.1 %   MCV 97.3  78.0 - 100.0 fL   MCH 32.9  26.0 - 34.0 pg   MCHC 33.8  30.0 - 36.0 g/dL   RDW 91.4  78.2 - 95.6 %   Platelets 159  150 - 400 K/uL     Studies/Results Radiology   MEDS, Scheduled . atorvastatin  20 mg Oral Daily  . budesonide (PULMICORT) nebulizer solution  0.5 mg Nebulization Q6H  . cefpodoxime  400 mg Oral Q12H  . dextromethorphan-guaiFENesin  1 tablet Oral BID  . ezetimibe   10 mg Oral q morning - 10a  . ipratropium  0.5 mg Nebulization Q4H  . levalbuterol  1.25 mg Nebulization Q4H  . metroNIDAZOLE  500 mg Oral Q8H  . oseltamivir  75 mg Oral BID  . sodium chloride  3 mL Intravenous Q12H  . tamsulosin  0.4 mg Oral QPC breakfast     Assessment: Cellulitis: Pt DOES NOT have an abscess.  This was discussed with the radiologist who agreed that fluid and air bubbles around ostomy site would be normal on POD 3  Flu  Plan:  Cont Abx for cellulitis for 10-14 day course.  Diet advanced today.  Ok to d/c from my standpoint.  Pt should continue low fiber diet at home with daily miralax and colace.  Will sign off.  Please call with questions.   LOS: 2 days    Vanita Panda, MD [facility], [place] [phone]   01/23/2013 9:58 AM

## 2013-01-23 NOTE — Evaluation (Signed)
Physical Therapy Evaluation Patient Details Name: Cody Coleman MRN: [id number] DOB: Jun 17, 1945 Today's Date: 01/23/2013 Time: 7829-5621 PT Time Calculation (min): 18 min  PT Assessment / Plan / Recommendation History of Present Illness  67 yo male admitted with sepsis, fever, tachycardia, +flu, pna. Hx of cancer, colostomy  Clinical Impression  On eval, pt required Min-Min guard assist for mobility-able to ambulate ~200 feet. Demonstrates some general weakness and instability with ambulation. Stability did improve as distance increased. Discussed HHPT and RW-pt and wife feel they can manage at home. Wife reports pt will have supervision/assist as needed. Recommended pt and nursing/family ambulate in hallway to mobilize until d/c.     PT Assessment  Patient needs continued PT services    Follow Up Recommendations  No PT follow up;Supervision/Assistance - 24 hour    Does the patient have the potential to tolerate intense rehabilitation      Barriers to Discharge        Equipment Recommendations  None recommended by PT    Recommendations for Other Services     Frequency Min 3X/week    Precautions / Restrictions Precautions Precautions: Fall Precaution Comments: droplet Restrictions Weight Bearing Restrictions: No   Pertinent Vitals/Pain No c/o pain      Mobility  Bed Mobility Bed Mobility: Supine to Sit Supine to Sit: 5: Supervision;HOB elevated Transfers Transfers: Sit to Stand;Stand to Sit Sit to Stand: 4: Min assist;From bed Stand to Sit: 4: Min guard assist;To bed Details for Transfer Assistance: Assist to rise, stabilize, control descent Ambulation/Gait Ambulation/Gait Assistance: 4: Min assist;4: Min guard Ambulation Distance (Feet): 200 Feet Assistive device: Rolling walker;None Ambulation/Gait Assistance Details: First 150 feet with walker due to unsteadiness, LE weakness-Min assist. Last 50 feet pt was able to ambulate without assistive device, although  still somewhat rigid and guarded. slow gait speed.  Gait Pattern: Step-through pattern    Exercises     PT Diagnosis: Difficulty walking;Generalized weakness  PT Problem List: Decreased strength;Decreased mobility;Decreased balance PT Treatment Interventions: DME instruction;Gait training;Functional mobility training;Therapeutic activities;Therapeutic exercise;Balance training;Patient/family education     PT Goals(Current goals can be found in the care plan section) Acute Rehab PT Goals Patient Stated Goal: home soon.  PT Goal Formulation: With patient/family Time For Goal Achievement: 02/06/13 Potential to Achieve Goals: Good  Visit Information  Last PT Received On: 01/23/13 Assistance Needed: +1 History of Present Illness: 67 yo male admitted with sepsis, fever, tachycardia, +flu, pna. Hx of cancer, colostomy       Prior Functioning  Home Living Family/patient expects to be discharged to:: Private residence Living Arrangements: Spouse/significant other Available Help at Discharge: Family Type of Home: House Home Access: Stairs to enter Secretary/administrator of Steps: 2 Entrance Stairs-Rails: None Home Layout: One level Home Equipment: None Prior Function Level of Independence: Independent Communication Communication: No difficulties    Cognition  Cognition Arousal/Alertness: Awake/alert Behavior During Therapy: WFL for tasks assessed/performed Overall Cognitive Status: Within Functional Limits for tasks assessed    Extremity/Trunk Assessment Upper Extremity Assessment Upper Extremity Assessment: Overall WFL for tasks assessed Lower Extremity Assessment Lower Extremity Assessment: Generalized weakness Cervical / Trunk Assessment Cervical / Trunk Assessment: Normal   Balance Balance Balance Assessed: Yes Dynamic Standing Balance Dynamic Standing - Balance Support: No upper extremity supported;Bilateral upper extremity supported Dynamic Standing - Level of  Assistance: 4: Min assist;5: Stand by assistance Dynamic Standing - Comments: Progressed from Min assist to Min guard assist. Pt is somwhat unsteady due to bil LE general weakness due  to llittle to no mobilization prior to this session  End of Session PT - End of Session Activity Tolerance: Patient tolerated treatment well Patient left: in bed;with call bell/phone within reach;with family/visitor present Nurse Communication: Mobility status  GP     [name], MPT Pager: [phone]

## 2013-01-23 NOTE — Discharge Summary (Signed)
Physician Discharge Summary  Cody Coleman MVH:[id number] DOB: 1945/09/21 DOA: 01/21/2013  PCP: Kirk Ruths, MD  Admit date: 01/21/2013 Discharge date: 01/23/2013  Time spent: 30 minutes  Recommendations for Outpatient Follow-up:  1. Follow up with PCP and surgery as recommended  Discharge Diagnoses:  Principal Problem:   Sepsis Active Problems:   CAD (coronary artery disease) of artery bypass graft   Fever   Cellulitis and abscess of trunk   Acute respiratory failure with hypoxia   COPD exacerbation   Discharge Condition: improved.   Diet recommendation: regular  Filed Weights   01/21/13 1328 01/22/13 0454 01/23/13 0458  Weight: 90 kg (198 lb 6.6 oz) 91.309 kg (201 lb 4.8 oz) 90.8 kg (200 lb 2.8 oz)    History of present illness:  Patient is a pleasant 67 year old gentleman with a past medical history of distal rectal cancer undergoing complicated colostomy revision on 01/17/2013. Post procedure there was some difficulty getting colostomy to function however this improved with the administration of magnesium citrate. During this time he had been having increased cough with sputum production, developing low-grade temperatures at home. He presented to the emergency department on 01/21/2013 with complaints of fever, chills, erythema and swelling over colostomy site. In the emergency department he was found to have erythema spreading laterally across his abdomen. A CT scan of abdomen performed on 01/21/2013 showed edema and inflammation involving the ostomy in the left lower quadrant with evidence of an abscess in the lateral aspect of the ostomy site in the abdominal wall. Patient was started on broad-spectrum IV antibiotic therapy with imipenem and vancomycin. He was seen and evaluated by Dr. Maisie Fus of general surgery who did not feel that he required surgical intervention at this point. By 01/22/2013 be doing better with improvement to his cellulitis. Dr. Maisie Fus recommended  transitioning to oral antibiotic therapy with Augmentin and Flagyl. Patient reported having a history of swelling and rash to Augmentin therapy. Discussed case with pharmacy, he was started on oral third generation cephalosporin. With regard to his cough and shortness of breath, likely secondary to influenza, as he came back influenza A positive. Patient started on Tamiflu   Hospital Course:  1. Sepsis, present on admission, evidenced by a temperature 102.9, heart rate of 140, respiratory to 22. Likely source of infection abdominal wall cellulitis. Patient was started on IV antibiotic therapy with vancomycin and imipenem, transitioning to oral antibiotics today per surgery's recommendations.Patient showing improvement  2.Abdominal wall cellulitis. Improved. Discussed case with general surgery, will transition to oral third cephalosporin therapy (history of PCN allergy) and Flagyl. Blood cultures obtained on 01/21/2013 showing no growth x2 sets.   3. Acute hypoxemic respiratory failure evidence by respiratory distress. Suspect secondary to influenza, received tamiflu.  He had a CT scan of lungs with IV contrast which did not show evidence of pulmonary embolism  4. Influenza A. Patient was started on Tamiflu. Seems improved today from yesterday.   Procedures:  none  Consultations: General Surgery   Discharge Exam: Filed Vitals:   01/23/13 0458  BP: 110/77  Pulse: 80  Temp: 98 F (36.7 C)  Resp: 18   General: Patient is nontoxic appearing, no acute distress awake alert oriented  Cardiovascular: Regular rate rhythm normal S1-S2  Respiratory: Has a few bilateral expiratory wheezes, normal respiratory effort  Abdomen: There is improvement to abdominal wall erythema and swelling, no pain to palpation  Musculoskeletal: Preserved range of motion to all extremities no edema   Discharge Instructions  Discharge Orders  Future Appointments Provider Department Dept Phone   02/05/2013 10:10  AM Romie Levee, MD [facility], [place] [phone]   02/08/2013 9:30 AM [facility] [phone]   05/09/2013 9:30 AM [facility] [phone]   08/08/2013 9:30 AM [facility] [phone]   Future Orders Complete By Expires   Discharge instructions  As directed    Comments:     follow up with PCP and surgery as recommended.       Medication List         ALPRAZolam 1 MG tablet  Commonly known as:  XANAX  Take 1 mg by mouth 3 (three) times daily as needed for sleep or anxiety. USUALLY WOULD ONLY TAKE 1/2 TAB IF NEEDED FOR SLEEP OR ANXIETY     aspirin 325 MG tablet  Take 1 tablet (325 mg total) by mouth every morning.     atorvastatin 20 MG tablet  Commonly known as:  LIPITOR  Take 20 mg by mouth daily.     cefpodoxime 200 MG tablet  Commonly known as:  VANTIN  Take 2 tablets (400 mg total) by mouth every 12 (twelve) hours.     docusate sodium 100 MG capsule  Commonly known as:  COLACE  Take 100 mg by mouth as needed for constipation.     ezetimibe 10 MG tablet  Commonly known as:  ZETIA  Take 10 mg by mouth every morning.     hydrocortisone 2.5 % lotion  Apply to affected area 2 times daily     ibuprofen 200 MG tablet  Commonly known as:  ADVIL,MOTRIN  Take 600 mg by mouth every 6 (six) hours as needed.     ipratropium 0.02 % nebulizer solution  Commonly known as:  ATROVENT  Take 2.5 mLs (0.5 mg total) by nebulization every 4 (four) hours.     levalbuterol 1.25 MG/0.5ML nebulizer solution  Commonly known as:  XOPENEX  Take 1.25 mg by nebulization every 4 (four) hours.     metroNIDAZOLE 500 MG tablet  Commonly known as:  FLAGYL  Take 1 tablet (500 mg total) by mouth every 8 (eight) hours.     oseltamivir 75 MG capsule  Commonly known as:  TAMIFLU  Take 1 capsule (75 mg total) by mouth 2 (two) times daily.     oxyCODONE 5 MG immediate release tablet  Commonly known as:  Oxy  IR/ROXICODONE  Take 1-2 tablets (5-10 mg total) by mouth every 6 (six) hours as needed.     tamsulosin 0.4 MG Caps capsule  Commonly known as:  FLOMAX  Take 0.4 mg by mouth daily after breakfast.       Allergies  Allergen Reactions  . Augmentin [Amoxicillin-Pot Clavulanate] Swelling and Rash       Follow-up Information   Follow up with Kirk Ruths, MD. Schedule an appointment as soon as possible for a visit in 1 week.   Specialty:  Family Medicine   Contact information:   [address] [place] [address] [phone]       Follow up with Vanita Panda., MD In 2 weeks. (As needed)    Specialty:  General Surgery   Contact information:   [address] [place] [address] [phone]        The results of significant diagnostics from this hospitalization (including imaging, microbiology, ancillary and laboratory) are listed below for reference.  Significant Diagnostic Studies: Dg Chest 2 View  01/21/2013   CLINICAL DATA:  Cough. chest congestion. Former smoker. Prior CABG. Personal history of rectal carcinoma. Four days post repair of parastomal hernia and colostomy prolapse.  EXAM: CHEST  2 VIEW  COMPARISON:  PET-CT 05/15/2012. CT chest 04/26/2012. One view chest x-ray 05/27/2008.  FINDINGS: Prior sternotomy for CABG. Cardiac silhouette moderately enlarged but stable. Suboptimal inspiration accounts for crowded bronchovascular markings at the lung bases. Stable moderate central peribronchial thickening. Lungs otherwise clear. No localized airspace consolidation. No pleural effusions. No pneumothorax. Normal pulmonary vascularity. Borderline gaseous distention of the visualized colon and multiple loops of small bowel in the visualized upper abdomen, with air-fluid levels. Mild degenerative changes throughout the thoracic spine.  IMPRESSION: 1. No acute cardiopulmonary disease. 2. Stable moderate cardiomegaly without pulmonary  edema. Stable moderate changes chronic bronchitis and/or asthma. 3. Bowel gas pattern in the visualized upper abdomen consistent with postoperative ileus.   Electronically Signed   By: Hulan Saas M.D.   On: 01/21/2013 07:11   Ct Angio Chest Pe W/cm &/or Wo Cm  01/21/2013   CLINICAL DATA:  Four days postop ostomy revision and parastomal hernia repair in this patient with personal history of rectal cancer post low AP resection in April, 2014. Cough. Chest congestion. Diaphoresis. Body aches. Chills. Erythema surrounding the ostomy.  EXAM: CT ANGIOGRAPHY CHEST WITH CONTRAST  TECHNIQUE: Multidetector CT imaging of the chest was performed using the standard protocol during bolus administration of intravenous contrast. Multiplanar CT image reconstructions including MIPs were obtained to evaluate the vascular anatomy.  CONTRAST:  OMNIPAQUE IOHEXOL 350 MG/ML IV. (This was also administered for the CT abdomen and pelvis reported separately).  COMPARISON:  PET-CT 05/15/2012.  CT chest 04/26/2012.  FINDINGS: Contrast opacification of the pulmonary arteries is moderate. Respiratory motion blurred images of the lower thorax. Overall, the study is of moderate to good diagnostic quality.  No filling defects within either main pulmonary artery or their branches in either lung to suggest pulmonary embolism. Heart enlarged with left ventricular predominance and mild left ventricular hypertrophy, unchanged. Prior sternotomy for CABG. Extensive 3 vessel coronary atherosclerosis. No pericardial effusion. Moderate atherosclerosis involving the thoracic aorta without evidence of aneurysm or dissection. Proximal great vessels widely patent.  Pulmonary parenchyma clear without localized airspace consolidation, interstitial disease, or parenchymal nodules or masses. Central airways patent with moderate to marked central peribronchial thickening. No pleural effusions. Biapical pleural thickening, more so than on the prior  examination.  Scattered normal sized lymph nodes throughout the mediastinum. No significant mediastinal, hilar, or axillary lymphadenopathy. Visualized thyroid gland unremarkable.  Bone window images demonstrate mild degenerative changes involving the mid and lower thoracic spine.  Review of the MIP images confirms the above findings.  IMPRESSION: 1. No evidence of pulmonary embolism. 2. Moderate to marked central bronchial wall thickening consistent with bronchitis and/or asthma. No acute cardiopulmonary disease otherwise. 3. Biapical pleural thickening, progressive since the examination 9 months ago. 4. Stable cardiomegaly with left ventricular enlargement and left ventricular hypertrophy.   Electronically Signed   By: Hulan Saas M.D.   On: 01/21/2013 09:48   Ct Abdomen Pelvis W Contrast  01/21/2013   ADDENDUM REPORT: 01/21/2013 10:27  ADDENDUM: In speaking with the surgeon caring for the patient on 01/21/2013 at 1025 hr, she states that the patient does not have a white count or fever and is not acting as though this is an infected fluid collection in the ostomy site. Therefore, likely represents post-operative  fluid with post-operative edema elsewhere in the ostomy.   Electronically Signed   By: Hulan Saas M.D.   On: 01/21/2013 10:27   01/21/2013   CLINICAL DATA:  Four days postop ostomy revision and parastomal hernia repair in this patient with personal history of rectal cancer post low AP resection in April, 2014. Cough. Chest congestion. Diaphoresis. Body aches. Chills. Erythema surrounding the ostomy.  EXAM: CT ABDOMEN AND PELVIS WITH CONTRAST  TECHNIQUE: Multidetector CT imaging of the abdomen and pelvis was performed using the standard protocol following bolus administration of intravenous contrast.  CONTRAST:  OMNIPAQUE IOHEXOL 350 MG/ML IV (also administered for the CTA chest reported separately). Oral contrast was also administered.  COMPARISON:  01/09/2013, 04/26/2012.  PET-CT  4/15/1,014.  FINDINGS: Edema/inflammation within the ostomy in the left lower quadrant, with an approximate 4.5 x 8.5 x 3.8 cm fluid collection in the lateral aspect of the ostomy site in the anterior abdominal wall, containing small gas bubbles. This fluid collection is immediately anterior to the lateral aspect of the divided left rectus muscle. There is edema/inflammation involving the muscle as well. The descending colon within the ostomy is decompressed. There is no evidence of intraperitoneal edema, inflammation, or abscess.  Normal appearing stomach and small bowel. Prior low AP rectal resection. Entire colon normal in appearance. Normal decompressed appendix in the right mid and low pelvis. No ascites.  Diffuse hepatic steatosis without focal hepatic parenchymal abnormality to suggest metastasis. Normal-appearing spleen with small focus of accessory splenic tissue medial to the spleen below the hilum. Moderate pancreatic atrophy, unchanged. Normal adrenal glands and kidneys. Gallbladder unremarkable by CT. No biliary ductal dilation. Moderate to severe aortoiliofemoral atherosclerosis without aneurysm or dissection. No significant lymphadenopathy.  Presacral soft tissue thickening, likely post radiation change and/or post-surgical scarring related to the prior rectal resection. Urinary bladder decompressed and unremarkable. Prostate gland normal in size for age, containing calcifications.  Bone window images unremarkable.  IMPRESSION: 1. Edema/inflammation involving the ostomy in the left lower quadrant, with evidence of an abscess in the lateral aspect of the ostomy site in the abdominal wall, measured above. 2. No evidence of intraperitoneal edema, inflammation, or abscess. 3. Diffuse hepatic steatosis without evidence of hepatic metastasis.  Electronically Signed: By: Hulan Saas M.D. On: 01/21/2013 10:00   Ct Abdomen Pelvis W Contrast  01/09/2013   CLINICAL DATA:  Left lower quadrant discomfort.  Colonic resection for rectal cancer May 2014.  BUN and creatinine were obtained on site at [facility] at  [address]  Results:  BUN 15 mg/dL,  Creatinine 0.8 mg/dL.  EXAM: CT ABDOMEN AND PELVIS WITH CONTRAST  TECHNIQUE: Multidetector CT imaging of the abdomen and pelvis was performed using the standard protocol following bolus administration of intravenous contrast.  CONTRAST:  OMNIPAQUE IOHEXOL 300 MG/ML  SOLN  COMPARISON:  05/15/2012 PET-CT.  04/26/2012 CT abdomen and pelvis.  FINDINGS: Post rectosigmoid resection with colostomy formation left lower quadrant. Long segment of colon extends through the subcutaneous fat of the colostomy but does not appear to be obstructed nor inflamed. Overall, no extra luminal bowel inflammatory process, free fluid or free air is noted.  Presacral soft tissue prominence extends to the base of the prostate gland and may represent scar tissue from treatment of tumor. This does not appear asymmetric to suggest recurrent tumor. Present exam represents the patient's 1st baseline post therapy exam to which follow-up examinations can be compared. No findings to suggest surrounding inflammation.  Calcified prostate  gland with minimal lobularity. Clinical correlation recommended. Non contrast filled images of the urinary bladder without abnormality.  Minimally prominent right inguinal lymph node with short axis dimension of 10.8 mm. No other adenopathy noted.  Tiny nodule right middle lobe (series 3, image 8) unchanged. Scarring/ atelectasis inferior aspect of the lingula.  Prominent coronary artery calcifications. Heart size within normal limits. Prominent epicardial fat incidentally noted.  Fatty infiltration of the liver. 7 mm low-density structure within the caudate lobe unchanged and too small to characterize.  No worrisome splenic, pancreatic, renal or adrenal lesion. No calcified gallstones.  Mild atherosclerotic type changes of the aorta and moderate  atherosclerotic type changes of the iliac arteries without evidence of aneurysmal dilation.  IMPRESSION: Post therapy changes without findings of inflammation or definitive findings of recurrent tumor as detailed above.   Electronically Signed   By: Bridgett Larsson M.D.   On: 01/09/2013 15:08    Microbiology: Recent Results (from the past 240 hour(s))  CULTURE, BLOOD (ROUTINE X 2)     Status: None   Collection Time    01/21/13  6:50 AM      Result Value Range Status   Specimen Description BLOOD RIGHT HAND   Final   Special Requests BOTTLES DRAWN AEROBIC AND ANAEROBIC   Final   Culture  Setup Time     Final   Value: 01/21/2013 12:37     Performed at [facility]   Culture     Final   Value:        BLOOD CULTURE RECEIVED NO GROWTH TO DATE CULTURE WILL BE HELD FOR 5 DAYS BEFORE ISSUING A FINAL NEGATIVE REPORT     Performed at [facility]   Report Status PENDING   Incomplete  CULTURE, BLOOD (ROUTINE X 2)     Status: None   Collection Time    01/21/13  7:49 AM      Result Value Range Status   Specimen Description BLOOD LEFT ARM   Final   Special Requests BOTTLES DRAWN AEROBIC AND ANAEROBIC EACH   Final   Culture  Setup Time     Final   Value: 01/21/2013 12:37     Performed at [facility]   Culture     Final   Value:        BLOOD CULTURE RECEIVED NO GROWTH TO DATE CULTURE WILL BE HELD FOR 5 DAYS BEFORE ISSUING A FINAL NEGATIVE REPORT     Performed at [facility]   Report Status PENDING   Incomplete     Labs: Basic Metabolic Panel:  Recent Labs Lab 01/21/13 0650 01/22/13 0532 01/23/13 0546  NA 134* 135 139  K 3.9 4.3 3.8  CL 96 99 101  CO2 26 27 26   GLUCOSE 131* 132* 115*  BUN 14 11 6   CREATININE 0.79 0.80 0.68  CALCIUM 8.7 8.2* 8.4   Liver Function Tests: No results found for this basename: AST, ALT, ALKPHOS, BILITOT, PROT, ALBUMIN,  in the last 168 hours No results found for this basename: LIPASE, AMYLASE,  in the last 168  hours No results found for this basename: AMMONIA,  in the last 168 hours CBC:  Recent Labs Lab 01/21/13 0650 01/22/13 0532 01/23/13 0546  WBC 5.0 4.7 3.7*  NEUTROABS 3.9  --   --   HGB 13.2 11.8* 12.0*  HCT 38.2* 35.6* 35.5*  MCV 96.0 96.5 97.3  PLT 153 137* 159   Cardiac Enzymes:  Recent Labs  Lab 01/21/13 0817  TROPONINI <0.30   BNP: BNP (last 3 results)  Recent Labs  01/21/13 0650  PROBNP 2189.0*   CBG: No results found for this basename: GLUCAP,  in the last 168 hours     Signed:  Jannelle Notaro  [facility] 01/23/2013, 1:52 PM

## 2013-01-23 NOTE — Progress Notes (Signed)
   CARE MANAGEMENT NOTE 01/23/2013  Patient:  Cody Coleman, Cody Coleman   Account Number:  [id number]  Date Initiated:  01/23/2013  Documentation initiated by:  Lanier Clam  Subjective/Objective Assessment:   67 Y/O M ADMITTED W/FEVER.     Action/Plan:   FROM HOME.   Anticipated DC Date:  01/23/2013   Anticipated DC Plan:  HOME/SELF CARE      DC Planning Services  CM consult      Choice offered to / List presented to:             Status of service:  Completed, signed off Medicare Important Message given?   (If response is "NO", the following Medicare IM given date fields will be blank) Date Medicare IM given:   Date Additional Medicare IM given:    Discharge Disposition:  HOME/SELF CARE  Per UR Regulation:  Reviewed for med. necessity/level of care/duration of stay  If discussed at Long Length of Stay Meetings, dates discussed:    Comments:

## 2013-01-27 LAB — CULTURE, BLOOD (ROUTINE X 2): Culture: NO GROWTH

## 2013-01-30 NOTE — ED Provider Notes (Signed)
Medical screening examination/treatment/procedure(s) were conducted as a shared visit with non-physician practitioner(s) and myself.  I personally evaluated the patient during the encounter. Please see my previous note.    Laray Anger, DO 01/30/13 1540

## 2013-02-05 ENCOUNTER — Ambulatory Visit (INDEPENDENT_AMBULATORY_CARE_PROVIDER_SITE_OTHER): Payer: 59 | Admitting: General Surgery

## 2013-02-05 ENCOUNTER — Encounter (INDEPENDENT_AMBULATORY_CARE_PROVIDER_SITE_OTHER): Payer: Self-pay | Admitting: General Surgery

## 2013-02-05 VITALS — BP 106/64 | HR 68 | Temp 98.7°F | Resp 14 | Ht 67.0 in | Wt 191.0 lb

## 2013-02-05 DIAGNOSIS — Z9889 Other specified postprocedural states: Secondary | ICD-10-CM

## 2013-02-05 NOTE — Progress Notes (Addendum)
Cody Coleman is doing well after surgery.  His post surgical course was complicated by the flu.  He is feeling better now.  His ostomy is functioning well.  He still has some areas that are sore and healing.  Exam Filed Vitals:   02/05/13 1006  BP: 106/64  Pulse: 68  Temp: 98.7 F (37.1 C)  Resp: 14   Gen: NAD Abd: soft, no obvious hernias  Ostomy: Beefy red, parastomal wounds healing  A/P Doing well after surgery.  Cont stool softeners.  Ok to travel.  RTO in 6 weeks for CEA recheck and cancer f/u.  I think he should be off work until then to allow his parastomal wounds to heal completely and to help prevent parastomal hernia formation.  No heavy lifting, indefinitely.

## 2013-02-05 NOTE — Patient Instructions (Signed)
Return to the office in 4-6 weeks

## 2013-02-08 ENCOUNTER — Other Ambulatory Visit (HOSPITAL_COMMUNITY): Payer: 59

## 2013-03-12 ENCOUNTER — Ambulatory Visit (INDEPENDENT_AMBULATORY_CARE_PROVIDER_SITE_OTHER): Payer: 59 | Admitting: General Surgery

## 2013-03-12 ENCOUNTER — Encounter (INDEPENDENT_AMBULATORY_CARE_PROVIDER_SITE_OTHER): Payer: Self-pay | Admitting: General Surgery

## 2013-03-12 ENCOUNTER — Encounter (INDEPENDENT_AMBULATORY_CARE_PROVIDER_SITE_OTHER): Payer: Self-pay

## 2013-03-12 VITALS — BP 130/84 | HR 100 | Temp 97.6°F | Resp 14 | Ht 67.0 in | Wt 192.8 lb

## 2013-03-12 DIAGNOSIS — C20 Malignant neoplasm of rectum: Secondary | ICD-10-CM

## 2013-03-12 NOTE — Patient Instructions (Signed)
Return to the office in May for cancer follow up.

## 2013-03-12 NOTE — Progress Notes (Signed)
Cody Coleman is a 68 y.o. male who is here for a follow up visit regarding his rectal cancer.  He is status post repair of a small parastomal hernia.  He was hospitalized for influenza approximately 3 days after his surgery. He is doing better now. His ostomy is functioning well. He is having regular bowel movements. He's not having any pouching difficulty. He denies any weight loss or abdominal pain. He's not having any blood in his stools.  Objective: Filed Vitals:   03/12/13 0930  BP: 130/84  Pulse: 100  Temp: 97.6 F (36.4 C)  Resp: 14    General appearance: alert and cooperative GI: normal findings: soft, non-tender Ostomy: Protruding approximately 2 cm, mucosa slightly edematous.  Assessment and Plan: Cody Coleman is doing well after abdominal perineal resection. I do not see any signs of recurrent peristomal hernia.  His physical exam is benign. I have ordered a CEA level to be drawn today. I will then see him back in 3 months for his yearly evaluation. At that point he will need CT scans and a colonoscopy.    Rosario Adie, MD [facility], [place]

## 2013-03-13 LAB — CEA: CEA: 2.8 ng/mL (ref 0.0–5.0)

## 2013-03-14 ENCOUNTER — Telehealth (INDEPENDENT_AMBULATORY_CARE_PROVIDER_SITE_OTHER): Payer: Self-pay

## 2013-03-14 NOTE — Telephone Encounter (Signed)
Called patient with CEA results.  Patient aware that his CEA levels are down from his last one 3 months ago.

## 2013-04-01 ENCOUNTER — Encounter (INDEPENDENT_AMBULATORY_CARE_PROVIDER_SITE_OTHER): Payer: Self-pay | Admitting: *Deleted

## 2013-05-09 ENCOUNTER — Other Ambulatory Visit (HOSPITAL_COMMUNITY): Payer: 59

## 2013-05-22 ENCOUNTER — Telehealth (INDEPENDENT_AMBULATORY_CARE_PROVIDER_SITE_OTHER): Payer: Self-pay | Admitting: *Deleted

## 2013-05-22 ENCOUNTER — Other Ambulatory Visit (INDEPENDENT_AMBULATORY_CARE_PROVIDER_SITE_OTHER): Payer: Self-pay

## 2013-05-22 DIAGNOSIS — C20 Malignant neoplasm of rectum: Secondary | ICD-10-CM

## 2013-05-22 NOTE — Telephone Encounter (Signed)
Pt is scheduled for a yearly f/u with Dr. Marcello Moores.  He said she mentioned having some additional tests.  [place] and was wanting [place] to have his test ordered and everything for the same week of his f/u appt.  He states he will be in town that whole week.  Please advise what test if any the pt will need to have done.  Thanks!  Anderson Malta

## 2013-05-22 NOTE — Telephone Encounter (Signed)
He needs CT scans of the Chest, Abd and Pelvis, a CEA level and a colonoscopy with his GI doc.

## 2013-05-23 ENCOUNTER — Other Ambulatory Visit (INDEPENDENT_AMBULATORY_CARE_PROVIDER_SITE_OTHER): Payer: Self-pay | Admitting: *Deleted

## 2013-05-23 ENCOUNTER — Telehealth (INDEPENDENT_AMBULATORY_CARE_PROVIDER_SITE_OTHER): Payer: Self-pay | Admitting: *Deleted

## 2013-05-23 DIAGNOSIS — C19 Malignant neoplasm of rectosigmoid junction: Secondary | ICD-10-CM

## 2013-05-23 NOTE — Telephone Encounter (Signed)
Patient is sch'd 06/14/13 for his 1 year repeat TCS, colorectal cancer, has colostomy -- what type of prep will he need

## 2013-05-29 ENCOUNTER — Telehealth (INDEPENDENT_AMBULATORY_CARE_PROVIDER_SITE_OTHER): Payer: Self-pay | Admitting: *Deleted

## 2013-05-29 DIAGNOSIS — Z1211 Encounter for screening for malignant neoplasm of colon: Secondary | ICD-10-CM

## 2013-05-29 NOTE — Telephone Encounter (Signed)
Instructions mailed for Trilyte, allergy contraindication for movi prep

## 2013-05-29 NOTE — Telephone Encounter (Signed)
Patient needs trilyte 

## 2013-05-29 NOTE — Telephone Encounter (Signed)
  Procedure: tcs  Reason/Indication:  Hx colon cancer -- has colostomy bag  Has patient had this procedure before?  Yes, 03/2012 -- epic  If so, when, by whom and where?    Is there a family history of colon cancer?  no  Who?  What age when diagnosed?    Is patient diabetic?   no      Does patient have prosthetic heart valve?  no  Do you have a pacemaker?  no  Has patient ever had endocarditis? no  Has patient had joint replacement within last 12 months?  no  Does patient tend to be constipated or take laxatives? no  Is patient on Coumadin, Plavix and/or Aspirin? yes  Medications: asa 325 mg daily, atorvastatin 10 mg daily, zetia 10 mg daily, tamsulosin 0.4 mg daily, xanax 1 mg prn for sleep  Allergies: see EPIC  Medication Adjustment: asa 2 days  Procedure date & time: 06/14/13

## 2013-05-29 NOTE — Telephone Encounter (Signed)
Any prep for one the patient prefers

## 2013-05-29 NOTE — Telephone Encounter (Signed)
Agree 

## 2013-05-31 ENCOUNTER — Encounter (HOSPITAL_COMMUNITY): Payer: Self-pay | Admitting: Pharmacy Technician

## 2013-05-31 MED ORDER — PEG 3350-KCL-NA BICARB-NACL 420 G PO SOLR: 4000.0000 mL | Freq: Once | ORAL | Status: DC

## 2013-06-10 ENCOUNTER — Ambulatory Visit (INDEPENDENT_AMBULATORY_CARE_PROVIDER_SITE_OTHER): Payer: Medicare Other | Admitting: Cardiology

## 2013-06-10 ENCOUNTER — Encounter (HOSPITAL_COMMUNITY): Payer: Self-pay

## 2013-06-10 ENCOUNTER — Ambulatory Visit (HOSPITAL_COMMUNITY): Payer: Medicare Other

## 2013-06-10 ENCOUNTER — Encounter: Payer: Self-pay | Admitting: Cardiology

## 2013-06-10 ENCOUNTER — Ambulatory Visit (HOSPITAL_COMMUNITY)
Admission: RE | Admit: 2013-06-10 | Discharge: 2013-06-10 | Disposition: A | Discharge status: Home / Self Care | Payer: Medicare Other | Source: Ambulatory Visit | Attending: Surgery | Admitting: Surgery

## 2013-06-10 VITALS — BP 117/50 | HR 56 | Ht 67.0 in | Wt 189.0 lb

## 2013-06-10 DIAGNOSIS — C19 Malignant neoplasm of rectosigmoid junction: Secondary | ICD-10-CM | POA: Insufficient documentation

## 2013-06-10 DIAGNOSIS — E7800 Pure hypercholesterolemia, unspecified: Secondary | ICD-10-CM | POA: Diagnosis not present

## 2013-06-10 DIAGNOSIS — I25810 Atherosclerosis of coronary artery bypass graft(s) without angina pectoris: Secondary | ICD-10-CM | POA: Diagnosis not present

## 2013-06-10 LAB — BUN: BUN: 22 mg/dL (ref 6–23)

## 2013-06-10 LAB — CREATININE, SERUM
Creatinine, Ser: 0.78 mg/dL (ref 0.50–1.35)
GFR calc Af Amer: >90 mL/min (ref >90)
GFR calc non Af Amer: >90 mL/min (ref >90)

## 2013-06-10 MED ORDER — ATORVASTATIN CALCIUM 40 MG PO TABS: ORAL_TABLET | ORAL | Status: DC

## 2013-06-10 MED ORDER — IOHEXOL 300 MG/ML  SOLN
100.0000 mL | Freq: Once | INTRAMUSCULAR | Status: AC | PRN
  Administered 2013-06-10: 100 mL via INTRAVENOUS

## 2013-06-10 NOTE — Patient Instructions (Signed)
Your physician has recommended you make the following change in your medication:   1. Stop Zetia.   2. Increase Atorvastatin to 40 mg daily.   Your physician recommends that you return for lab work today for lipid and hepatic.  Your physician recommends that you return for a FASTING lipid profile and hepatic in 2 months with your PCP. Order given to you today.   Your physician wants you to follow-up in: 1 year with Dr. Aundra Dubin. You will receive a reminder letter in the mail two months in advance. If you don't receive a letter, please call our office to schedule the follow-up appointment.

## 2013-06-10 NOTE — Progress Notes (Signed)
Patient ID: Cody Coleman, male   DOB: Jun 18, 1945, 68 y.o.   MRN: [id number] PCP: Dr. Orson Ape  68 yo with history of CAD s/p CABG and rectal cancer s/p abdominal perineal resection in 5/14 presents for cardiology followup.  He had CABG x 6 in 2005.  Stress tests later in 2005 and in 2008 were normal.  He has been stable over the last year from a cardiac standpoint.  No exertional chest pain or dyspnea.  He remains active.  As above, he had APR in 5/14.  He had a complicated colostomy revision in 12/14.  After this operation, he developed abdominal wall cellulitis and septic shock, also in 12/14.  He was positive for influenza also.  His colostomy will be permanent. He retired this year, and he and his wife have moved to [facility].  Zetia has become very expensive for him.   Labs (3/14): LDL 83, HDL 64, TGs 203 Labs (4/14): K 3.4, creatinine 0.77 Labs (6/14): LDL 63, HDL 41 Labs (5/15): creatinine 0.78  PMH: 1. Rectal cancer: Abdominal perineal resection in 5/14, has permanent colostomy.  2. CAD: CABG in 2005 with LIMA-LAD, SVG-D, seq SVG-OM1/dLCx, seq SVG-PDA/PLV.  Stress myoviews in 11/05 and in 2008 were normal. 3. 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    FH: CAD (brother), CVA (father), CHF (mother).   ROS: All systems were reviewed and negative except as per HPI.   Current Outpatient Prescriptions  Medication Sig Dispense Refill  . ALPRAZolam (XANAX) 1 MG tablet Take 1 mg by mouth 3 (three) times daily as needed for sleep or anxiety. USUALLY WOULD ONLY TAKE 1/2 TAB IF NEEDED FOR SLEEP OR ANXIETY      . aspirin 325 MG tablet Take 1 tablet (325 mg total) by mouth every morning.      Marland Kitchen atorvastatin (LIPITOR) 40 MG tablet 1 tablet daily.  30 tablet  11  . docusate sodium (COLACE) 100 MG capsule Take 100 mg by mouth as needed for constipation.      . tamsulosin (FLOMAX) 0.4 MG CAPS Take  0.4 mg by mouth daily after breakfast.        No current facility-administered medications for this visit.    BP 117/50  Pulse 56  Ht 5\' 7"  (1.702 m)  Wt 85.73 kg (189 lb)  BMI 29.59 kg/m2 General: NAD Neck: No JVD, no thyromegaly or thyroid nodule.  Lungs: Clear to auscultation bilaterally with normal respiratory effort. CV: Nondisplaced PMI.  Heart regular S1/S2, +S4, no murmur.  No peripheral edema.  No carotid bruit.  Normal pedal pulses.  Abdomen: Soft, nontender, no hepatosplenomegaly, no distention. Colostomy in place.  Skin: Intact without lesions or rashes.  Neurologic: Alert and oriented x 3.  Psych: Normal affect. Extremities: No clubbing or cyanosis.  ] Assessment/Plan: 1. CAD: No ischemic symptoms.  He has done quite well since his CABG.  Last myoview in 2008 was normal.  I do not think that there is an indication for a functional study at this point given lack of symptoms.  I will have him continue ASA 81 and statin.  2. Hyperlipidemia: Zetia is getting expensive.  I will check lipids/LFTs today.  I will stop Zetia and increase atorvastatin, with lipids/LFTs in 2 months.    Larey Dresser 06/10/2013 5:45 PM

## 2013-06-11 ENCOUNTER — Encounter (INDEPENDENT_AMBULATORY_CARE_PROVIDER_SITE_OTHER): Payer: Self-pay | Admitting: General Surgery

## 2013-06-11 ENCOUNTER — Ambulatory Visit (INDEPENDENT_AMBULATORY_CARE_PROVIDER_SITE_OTHER): Payer: Medicare Other | Admitting: General Surgery

## 2013-06-11 VITALS — BP 124/72 | HR 68 | Temp 98.0°F | Resp 18 | Ht 70.0 in | Wt 188.0 lb

## 2013-06-11 DIAGNOSIS — C20 Malignant neoplasm of rectum: Secondary | ICD-10-CM

## 2013-06-11 LAB — HEPATIC FUNCTION PANEL
ALK PHOS: 56 U/L (ref 39–117)
ALT: 35 U/L (ref 0–53)
AST: 32 U/L (ref 0–37)
Albumin: 4.4 g/dL (ref 3.5–5.2)
Bilirubin, Direct: 0 mg/dL (ref 0.0–0.3)
TOTAL PROTEIN: 7.2 g/dL (ref 6.0–8.3)
Total Bilirubin: 0.8 mg/dL (ref 0.2–1.2)

## 2013-06-11 LAB — LIPID PANEL
CHOLESTEROL: 159 mg/dL (ref 0–200)
HDL: 37.5 mg/dL — ABNORMAL LOW (ref >39.00)
LDL CALC: 72 mg/dL (ref 0–99)
Total CHOL/HDL Ratio: 4
Triglycerides: 246 mg/dL — ABNORMAL HIGH (ref 0.0–149.0)
VLDL: 49.2 mg/dL — AB (ref 0.0–40.0)

## 2013-06-11 LAB — CEA: CEA: 2.8 ng/mL (ref 0.0–5.0)

## 2013-06-11 NOTE — Progress Notes (Signed)
Cody Coleman is a 68 y.o. male who is here for a follow up visit regarding his rectal cancer. He is a 68 y.o. male who is status post a APR on 06/11/12.  He is status post repair of a small parastomal hernia in Dec 2014.  His ostomy is functioning well. He is having regular bowel movements. He's not having any pouching difficulty. He denies any weight loss or abdominal pain. He's not having any blood in his stools.  Objective:  Filed Vitals:   06/11/13 0956  BP: 124/72  Pulse: 68  Temp: 98 F (36.7 C)  Resp: 18    General appearance: alert and cooperative  CV: RRR Lungs: CTA GI: normal findings: soft, non-tender  Ostomy: Protruding approximately 2 cm, mucosa slightly edematous.   CT CHEST, ABD AND PELVIS IMPRESSION:  1. Mild but perceptible increase in soft tissue density along the left pelvic floor compared to prior exams. Although conceivably some delayed scarring, radiation therapy related changes, or granulation tissue might cause a similar appearance, given the proximity of this region to the prior tumor, the appearance must be considered suspicious for early recurrence. Consider nuclear medicine PET-CT for further characterization.  2. Incidental findings include peristomal hernia containing omental adipose tissue and a normal cyst loop of small bowel; mild hepatic steatosis ; coronary atherosclerosis; and mild lingular scarring.  Assessment and Plan:  [name] is doing well after abdominal perineal resection. He does have a recurrent peristomal hernia. His physical exam is benign. I have ordered a CEA level to be drawn today. His CT scans show no obvious metastatic disease. He does have some mild thickening of his scar noted and evidence of recurrent peristomal hernia That contains a loop of small bowel and omentum. I have examined his perineum in great detail and feel no masses that are worrisome. We discussed performing a PET scan versus watching this area over the next few  months. We have decided to proceed with watching this for now. If his CEA is elevated though, I would repeat his PET scan at that point.  He is scheduled to get a colonoscopy at the end of the week.  We discussed his peristomal hernia. It appears that his pouching is stable. He does not have any obstructive symptoms.  I will then see him back in 3 months for evaluation[place] instructed on symptoms that would be concerning. If he were to have problems with obstruction or his hernia and needed to be hospitalized, I have recommended that he be transferred here for further evaluation instead of being treated there.

## 2013-06-11 NOTE — Patient Instructions (Signed)
Return to the office in 3 months. Call the office if you're peristomal hernia worsens before then.

## 2013-06-13 DIAGNOSIS — N400 Enlarged prostate without lower urinary tract symptoms: Secondary | ICD-10-CM | POA: Diagnosis not present

## 2013-06-13 DIAGNOSIS — Z0000 Encounter for general adult medical examination without abnormal findings: Secondary | ICD-10-CM | POA: Diagnosis not present

## 2013-06-13 DIAGNOSIS — Z6829 Body mass index (BMI) 29.0-29.9, adult: Secondary | ICD-10-CM | POA: Diagnosis not present

## 2013-06-14 ENCOUNTER — Telehealth (INDEPENDENT_AMBULATORY_CARE_PROVIDER_SITE_OTHER): Payer: Self-pay

## 2013-06-14 ENCOUNTER — Encounter (HOSPITAL_COMMUNITY)
Admission: RE | Disposition: A | Discharge status: Home / Self Care | Payer: Self-pay | Source: Ambulatory Visit | Attending: Internal Medicine

## 2013-06-14 ENCOUNTER — Encounter (HOSPITAL_COMMUNITY): Payer: Self-pay | Admitting: *Deleted

## 2013-06-14 ENCOUNTER — Ambulatory Visit (HOSPITAL_COMMUNITY)
Admission: RE | Admit: 2013-06-14 | Discharge: 2013-06-14 | Disposition: A | Discharge status: Home / Self Care | Payer: Medicare Other | Source: Ambulatory Visit | Attending: Internal Medicine | Admitting: Internal Medicine

## 2013-06-14 DIAGNOSIS — Z87891 Personal history of nicotine dependence: Secondary | ICD-10-CM | POA: Diagnosis not present

## 2013-06-14 DIAGNOSIS — Z7982 Long term (current) use of aspirin: Secondary | ICD-10-CM | POA: Insufficient documentation

## 2013-06-14 DIAGNOSIS — Z79899 Other long term (current) drug therapy: Secondary | ICD-10-CM | POA: Diagnosis not present

## 2013-06-14 DIAGNOSIS — D126 Benign neoplasm of colon, unspecified: Secondary | ICD-10-CM | POA: Diagnosis not present

## 2013-06-14 DIAGNOSIS — Z1211 Encounter for screening for malignant neoplasm of colon: Secondary | ICD-10-CM | POA: Diagnosis present

## 2013-06-14 DIAGNOSIS — N138 Other obstructive and reflux uropathy: Secondary | ICD-10-CM | POA: Insufficient documentation

## 2013-06-14 DIAGNOSIS — R350 Frequency of micturition: Secondary | ICD-10-CM | POA: Diagnosis not present

## 2013-06-14 DIAGNOSIS — Z09 Encounter for follow-up examination after completed treatment for conditions other than malignant neoplasm: Secondary | ICD-10-CM | POA: Insufficient documentation

## 2013-06-14 DIAGNOSIS — I252 Old myocardial infarction: Secondary | ICD-10-CM | POA: Diagnosis not present

## 2013-06-14 DIAGNOSIS — I2510 Atherosclerotic heart disease of native coronary artery without angina pectoris: Secondary | ICD-10-CM | POA: Insufficient documentation

## 2013-06-14 DIAGNOSIS — Z85048 Personal history of other malignant neoplasm of rectum, rectosigmoid junction, and anus: Secondary | ICD-10-CM | POA: Diagnosis not present

## 2013-06-14 DIAGNOSIS — C19 Malignant neoplasm of rectosigmoid junction: Secondary | ICD-10-CM

## 2013-06-14 DIAGNOSIS — Z951 Presence of aortocoronary bypass graft: Secondary | ICD-10-CM | POA: Insufficient documentation

## 2013-06-14 DIAGNOSIS — E7800 Pure hypercholesterolemia, unspecified: Secondary | ICD-10-CM | POA: Insufficient documentation

## 2013-06-14 DIAGNOSIS — N401 Enlarged prostate with lower urinary tract symptoms: Secondary | ICD-10-CM | POA: Diagnosis not present

## 2013-06-14 DIAGNOSIS — IMO0002 Reserved for concepts with insufficient information to code with codable children: Secondary | ICD-10-CM | POA: Insufficient documentation

## 2013-06-14 DIAGNOSIS — Z85038 Personal history of other malignant neoplasm of large intestine: Secondary | ICD-10-CM | POA: Diagnosis not present

## 2013-06-14 HISTORY — PX: COLONOSCOPY: SHX5424

## 2013-06-14 SURGERY — COLONOSCOPY
Anesthesia: Moderate Sedation

## 2013-06-14 MED ORDER — SODIUM CHLORIDE 0.9 % IV SOLN
INTRAVENOUS | Status: DC
  Administered 2013-06-14: 12:00:00 via INTRAVENOUS

## 2013-06-14 MED ORDER — MIDAZOLAM HCL 5 MG/5ML IJ SOLN
INTRAMUSCULAR | Status: AC
  Filled 2013-06-14: qty 10

## 2013-06-14 MED ORDER — MIDAZOLAM HCL 5 MG/5ML IJ SOLN
INTRAMUSCULAR | Status: DC | PRN
  Administered 2013-06-14 (×3): 2 mg via INTRAVENOUS

## 2013-06-14 MED ORDER — MEPERIDINE HCL 50 MG/ML IJ SOLN
INTRAMUSCULAR | Status: AC
  Filled 2013-06-14: qty 1

## 2013-06-14 MED ORDER — MEPERIDINE HCL 50 MG/ML IJ SOLN
INTRAMUSCULAR | Status: DC | PRN
  Administered 2013-06-14 (×2): 25 mg via INTRAVENOUS

## 2013-06-14 NOTE — Op Note (Signed)
COLONOSCOPY PROCEDURE REPORT  PATIENT:  Cody Coleman  MR#:  [id number] Birthdate:  [birth year], 68 y.o., male Endoscopist:  Dr. Rogene Houston, MD Referred By:  Dr. Leonides Grills, MD  Procedure Date: 06/14/2013  Procedure:   Colonoscopy  Indications:  Patient is 68 year old Caucasian male with history of rectal carcinoma status post APR in May 2014 for stage IIA disease and is now is returning for surveillance colonoscopy. He remains in remission. Recent CEA was 2.8. A recent chest and abdominopelvic CT did not reveal any changes of metastatic disease. He has peristomal hernia and planning to undergo surgery later this year.  Informed Consent:  The procedure and risks were reviewed with the patient and informed consent was obtained.  Medications:  Demerol 50 mg IV Versed 6 mg IV  Description of procedure:  Digital examination of colostomy was performed and colonoscope was advanced from the stoma into sigmoid colon and beyond. Scope was passed into cecum. The cecum was deeply intubated. These structures were well-seen and photographed for the record. From the level of the cecum and ileocecal valve, the scope was slowly and cautiously withdrawn. The mucosal surfaces were carefully surveyed utilizing scope tip to flexion to facilitate fold flattening as needed. The scope was pulled down into the rectum where a thorough exam performed and endoscope withdrawn.  Findings:   Prep excellent. Ulcerated polyp noted at colostomy with 2 smaller lesions. Larger polyp measured about 12 x 15 mm. Mucosa of cecum and rest of the colon was normal.   Therapeutic/Diagnostic Maneuvers Performed:  Biopsy was taken from the larger polyp as well as two smaller polyps and submitted together. Residual polyp was coagulated using snare tip.  Complications:  None  Cecal Withdrawal Time:  15 minutes  Impression:  Examination performed to cecum via colostomy. 12 x 15 mm ulcerated polyp along with two  smaller polyps located at colostomy noted on visual inspection of colostomy. Biopsy taken for routine histology and residual polyp was coagulated using snare tip.   Recommendations:  Standard instructions given. I will contact patient with biopsy results and further recommendations.  Rogene Houston  06/14/2013 1:40 PM  CC: Dr. Leonides Grills, MD & Dr. Rayne Du ref. provider found CC  Dr. Leighton Ruff, MD

## 2013-06-14 NOTE — H&P (Signed)
Cody Coleman is an 68 y.o. male.   Chief Complaint: Patient is here for colonoscopy. HPI: Patient is 42 Caucasian male was found to have rectal adenocarcinoma in March 2014. He underwent laparoscopic-assisted abdominoperineal resection in May 2014. He was stage IIa disease and he did not require adjuvant therapy. His colostomy was revised in December 2014. He is here for surveillance colonoscopy. He denies abdominal pain bleeding or melena into colostomy. Family history is negative for CRC to  Past Medical History  Diagnosis Date  . CAD (coronary artery disease)   . High cholesterol   . Urgency-frequency syndrome     FLOMAX HAS HELPED  . MI (myocardial infarction)     x 2  AT AGE 62 AND AT AGE 41  DR. [facility] IS PT'S MEDICAL DOCTOR  . Cancer     RECTAL CANCER--SOME RECTAL BLEEDING  . Pain     RIGHT KNEE PAIN AND OCCAS OTHER JOINT PAINS  . Sleep difficulties     NEVER SLEEPS WELL - WAKES UP AFTER SEVERAL HOURS AND NOT ABLE TO GO BACK TO SLEEP--DID SLEEP STUDY AND TOLD HE DID NOT HAVE SLEEP APNEA  . Former tobacco use   . BPH (benign prostatic hyperplasia)     Past Surgical History  Procedure Laterality Date  . Coronary artery bypass graft      2005  . Tonsillectomy    . Coronary angioplasty with stent placement      before bypass  . Colonoscopy N/A 04/20/2012    Procedure: COLONOSCOPY;  Surgeon: Rogene Houston, MD;  Location: [facility];  Service: Endoscopy;  Laterality: N/A;  225  . Eus N/A 05/03/2012    Procedure: LOWER ENDOSCOPIC ULTRASOUND (EUS);  Surgeon: Milus Banister, MD;  Location: [facility];  Service: Endoscopy;  Laterality: N/A;  . Laparoscopic assisted abdominal perineal resection N/A 06/11/2012    Procedure: LAPAROSCOPIC ASSISTED ABDOMINAL PERINEAL RESECTION;  Surgeon: Leighton Ruff, MD;  Location: [facility] ORS;  Service: General;  Laterality: N/A;  . Colostomy revision N/A 01/17/2013    Procedure: COLOSTOMY REVISION;  Surgeon: Leighton Ruff, MD;  Location: [facility] OR;   Service: General;  Laterality: N/A;    Family History  Problem Relation Age of Onset  . Colon cancer Neg Hx   . Congestive Heart Failure Mother   . Stroke Father   . Coronary artery disease Brother   . Coronary artery disease Father    Social History:  reports that he quit smoking about 11 years ago. His smoking use included Cigarettes. He has a 30 pack-year smoking history. He has never used smokeless tobacco. He reports that he drinks about 15 ounces of alcohol per week. He reports that he does not use illicit drugs.  Allergies:  Allergies  Allergen Reactions  . Augmentin [Amoxicillin-Pot Clavulanate] Swelling and Rash    Medications Prior to Admission  Medication Sig Dispense Refill  . ALPRAZolam (XANAX) 1 MG tablet Take 1 mg by mouth 3 (three) times daily as needed for sleep or anxiety. USUALLY WOULD ONLY TAKE 1/2 TAB IF NEEDED FOR SLEEP OR ANXIETY      . aspirin 325 MG tablet Take 1 tablet (325 mg total) by mouth every morning.      Marland Kitchen atorvastatin (LIPITOR) 40 MG tablet 1 tablet daily.  30 tablet  11  . docusate sodium (COLACE) 100 MG capsule Take 100 mg by mouth as needed for constipation.      . tamsulosin (FLOMAX) 0.4 MG CAPS Take 0.4 mg by  mouth daily after breakfast.         No results found for this or any previous visit (from the past 48 hour(s)). No results found.  ROS  Blood pressure 127/75, pulse 65, temperature 98.7 F (37.1 C), temperature source Oral, resp. rate 17, SpO2 99.00%. Physical Exam  Constitutional: He appears well-developed and well-nourished.  HENT:  Mouth/Throat: Oropharynx is clear and moist.  Eyes: Conjunctivae are normal. No scleral icterus.  Neck: No thyromegaly present.  Cardiovascular: Normal rate, regular rhythm and normal heart sounds.   No murmur heard. Respiratory: Effort normal and breath sounds normal.  GI:  Patient has colostomy at LLQ with peristomal hernia. Abdomen is soft and nontender without organomegaly or masses.   Musculoskeletal: He exhibits no edema.  Lymphadenopathy:    He has no cervical adenopathy.  Neurological: He is alert.  Skin: Skin is warm and dry.     Assessment/Plan History of rectal adenocarcinoma status post abdominoperineal resection one year ago. Surveillance colonoscopy.  Rogene Houston 06/14/2013, 12:45 PM

## 2013-06-14 NOTE — Telephone Encounter (Signed)
V/M Patients CEA lab is within normal limits

## 2013-06-14 NOTE — Discharge Instructions (Signed)
Resume usual medications except aspirin which can be started on 06/17/2013. Resume usual diet. No driving for 24 hours. Physician will call with biopsy results.  Colonoscopy, Care After Refer to this sheet in the next few weeks. These instructions provide you with information on caring for yourself after your procedure. Your health care provider may also give you more specific instructions. Your treatment has been planned according to current medical practices, but problems sometimes occur. Call your health care provider if you have any problems or questions after your procedure. WHAT TO EXPECT AFTER THE PROCEDURE  After your procedure, it is typical to have the following:  A small amount of blood in your stool.  Moderate amounts of gas and mild abdominal cramping or bloating. HOME CARE INSTRUCTIONS  Do not drive, operate machinery, or sign important documents for 24 hours.  You may shower and resume your regular physical activities, but move at a slower pace for the first 24 hours.  Take frequent rest periods for the first 24 hours.  Walk around or put a warm pack on your abdomen to help reduce abdominal cramping and bloating.  Drink enough fluids to keep your urine clear or pale yellow.  You may resume your normal diet as instructed by your health care provider. Avoid heavy or fried foods that are hard to digest.  Avoid drinking alcohol for 24 hours or as instructed by your health care provider.  Only take over-the-counter or prescription medicines as directed by your health care provider.  If a tissue sample (biopsy) was taken during your procedure:  Do not take aspirin or blood thinners for 7 days, or as instructed by your health care provider.  Do not drink alcohol for 7 days, or as instructed by your health care provider.  Eat soft foods for the first 24 hours. SEEK MEDICAL CARE IF: You have persistent spotting of blood in your stool 2 3 days after the procedure. SEEK  IMMEDIATE MEDICAL CARE IF:  You have more than a small spotting of blood in your stool.  You pass large blood clots in your stool.  Your abdomen is swollen (distended).  You have nausea or vomiting.  You have a fever.  You have increasing abdominal pain that is not relieved with medicine. Document Released: 09/01/2003 Document Revised: 11/07/2012 Document Reviewed: 09/24/2012 [facility] Patient Information 2014 [place].

## 2013-06-17 ENCOUNTER — Encounter (HOSPITAL_COMMUNITY): Payer: Self-pay | Admitting: Internal Medicine

## 2013-06-20 ENCOUNTER — Encounter (INDEPENDENT_AMBULATORY_CARE_PROVIDER_SITE_OTHER): Payer: Self-pay | Admitting: *Deleted

## 2013-07-03 ENCOUNTER — Encounter (INDEPENDENT_AMBULATORY_CARE_PROVIDER_SITE_OTHER): Payer: Self-pay

## 2013-08-08 ENCOUNTER — Other Ambulatory Visit (HOSPITAL_COMMUNITY): Payer: 59

## 2013-09-09 ENCOUNTER — Ambulatory Visit (INDEPENDENT_AMBULATORY_CARE_PROVIDER_SITE_OTHER): Payer: Medicare Other | Admitting: General Surgery

## 2013-09-09 ENCOUNTER — Encounter (INDEPENDENT_AMBULATORY_CARE_PROVIDER_SITE_OTHER): Payer: Self-pay | Admitting: General Surgery

## 2013-09-09 VITALS — BP 128/78 | HR 79 | Temp 98.0°F | Resp 18 | Ht 67.0 in | Wt 196.0 lb

## 2013-09-09 DIAGNOSIS — K435 Parastomal hernia without obstruction or  gangrene: Secondary | ICD-10-CM

## 2013-09-09 DIAGNOSIS — IMO0002 Reserved for concepts with insufficient information to code with codable children: Secondary | ICD-10-CM | POA: Diagnosis not present

## 2013-09-09 DIAGNOSIS — I25810 Atherosclerosis of coronary artery bypass graft(s) without angina pectoris: Secondary | ICD-10-CM

## 2013-09-09 MED ORDER — DEXTROSE 5 % IV SOLN: 2.0000 g | INTRAVENOUS | Status: DC

## 2013-09-09 NOTE — Progress Notes (Signed)
Chief Complaint  Patient presents with  . Follow-up    rectal    HISTORY:  SAHITH NURSE is a 68 y.o. male who presents to the office with a parastomal hernia.  He is also here for a follow up visit regarding his rectal cancer (T3N0, MSI neg). He is a 68 y.o. male who is status post a APR on 06/11/12. He is status post repair of a small parastomal hernia in Dec 2014.  Unfortunately, he got the flu right after this and his coughing ruptured the suture repair.  His ostomy is functioning well. He is having regular bowel movements. He's having some pouching difficulty. He denies any weight loss or abdominal pain. He's not having any blood in his stools.    Past Medical History  Diagnosis Date  . CAD (coronary artery disease)   . High cholesterol   . Urgency-frequency syndrome     FLOMAX HAS HELPED  . MI (myocardial infarction)     x 2  AT AGE 39 AND AT AGE 33  DR. [facility] IS PT'S MEDICAL DOCTOR  . Cancer     RECTAL CANCER--SOME RECTAL BLEEDING  . Pain     RIGHT KNEE PAIN AND OCCAS OTHER JOINT PAINS  . Sleep difficulties     NEVER SLEEPS WELL - WAKES UP AFTER SEVERAL HOURS AND NOT ABLE TO GO BACK TO SLEEP--DID SLEEP STUDY AND TOLD HE DID NOT HAVE SLEEP APNEA  . Former tobacco use   . BPH (benign prostatic hyperplasia)        Past Surgical History  Procedure Laterality Date  . Coronary artery bypass graft      2005  . Tonsillectomy    . Coronary angioplasty with stent placement      before bypass  . Colonoscopy N/A 04/20/2012    Procedure: COLONOSCOPY;  Surgeon: Rogene Houston, MD;  [facility];  Service: Endoscopy;  Laterality: N/A;  225  . Eus N/A 05/03/2012    Procedure: LOWER ENDOSCOPIC ULTRASOUND (EUS);  Surgeon: Milus Banister, MD;  Location: [facility];  Service: Endoscopy;  Laterality: N/A;  . Laparoscopic assisted abdominal perineal resection N/A 06/11/2012    Procedure: LAPAROSCOPIC ASSISTED ABDOMINAL PERINEAL RESECTION;  Surgeon: Leighton Ruff, MD;  Location: [facility];  Service: General;  Laterality: N/A;  . Colostomy revision N/A 01/17/2013    Procedure: COLOSTOMY REVISION;  Surgeon: Leighton Ruff, MD;  Location: [place];  Service: General;  Laterality: N/A;  . Colonoscopy N/A 06/14/2013    Procedure: COLONOSCOPY;  Surgeon: Rogene Houston, MD;  [facility];  Service: Endoscopy;  Laterality: N/A;  1245      Current Outpatient Prescriptions  Medication Sig Dispense Refill  . ALPRAZolam (XANAX) 1 MG tablet Take 1 mg by mouth 3 (three) times daily as needed for sleep or anxiety. USUALLY WOULD ONLY TAKE 1/2 TAB IF NEEDED FOR SLEEP OR ANXIETY      . atorvastatin (LIPITOR) 40 MG tablet 1 tablet daily.  30 tablet  11  . docusate sodium (COLACE) 100 MG capsule Take 100 mg by mouth as needed for constipation.      . tamsulosin (FLOMAX) 0.4 MG CAPS Take 0.4 mg by mouth daily after breakfast.        No current facility-administered medications for this visit.     Allergies  Allergen Reactions  . Augmentin [Amoxicillin-Pot Clavulanate] Swelling and Rash      Family History  Problem Relation Age of Onset  . Colon  cancer Neg Hx   . Congestive Heart Failure Mother   . Stroke Father   . Coronary artery disease Brother   . Coronary artery disease Father       History   Social History  . Marital Status: Married    Spouse Name: N/A    Number of Children: N/A  . Years of Education: N/A   Social History Main Topics  . Smoking status: Former Smoker -- 1.00 packs/day for 30 years    Types: Cigarettes    Quit date: 01/31/2002  . Smokeless tobacco: Never Used     Comment: quit 66yr ago  . Alcohol Use: 15.0 oz/week    25 Shots of liquor per week     Comment: drinks 2-3 drinks on occasion  . Drug Use: No  . Sexual Activity: No   Other Topics Concern  . None   Social History Narrative   Lives with wife.  Drives, no assist devices.       REVIEW OF SYSTEMS - PERTINENT POSITIVES ONLY: Review of Systems - General ROS: negative for -  chills, fever or weight loss Respiratory ROS: no cough, shortness of breath, or wheezing Cardiovascular ROS: no chest pain or dyspnea on exertion Gastrointestinal ROS: no abdominal pain, change in bowel habits, or black or bloody stools Genito-Urinary ROS: no dysuria, trouble voiding, or hematuria  EXAM: Filed Vitals:   09/09/13 1529  BP: 128/78  Pulse: 79  Temp: 98 F (36.7 C)  Resp: 18    General appearance: alert and cooperative Resp: clear to auscultation bilaterally Cardio: regular rate and rhythm GI: normal findings: soft, non-tender Large parastomal hernia present, ostomy functioning  LABORATORY RESULTS: Available labs are reviewed  Lab Results  Component Value Date   CEA 2.8 06/11/2013       ASSESSMENT AND PLAN: Cody HEGNERis a 68y.o. M with a h/o Stage II rectal cancer who presents to the office in followup. I do not see any signs of recurrent rectal cancer. We discussed his parastomal hernia. I think that given that it is enlarging at this time and he is having trouble with belching, I have recommended repair with mesh. I believe we can do this laparoscopically. We discussed the risk and benefits of surgery including bleeding, infection, mesh erosion and mesh infections as well as recurrence rates which was parastomal hernias can be as high as 15-20%. We discussed using biologic mesh versus synthetic. All questions were answered. We will schedule this in the near future. I will obtain a repeat CEA during his hospitalization to follow up on his rectal cancer.    ARosario Adie MD Colon and Rectal Surgery / [facility]      Visit Diagnoses: 1. Parastomal hernia without obstruction or gangrene     Primary Care Physician: MLeonides Grills MD

## 2013-10-03 ENCOUNTER — Encounter (HOSPITAL_COMMUNITY): Payer: Self-pay | Admitting: Pharmacy Technician

## 2013-10-23 NOTE — Patient Instructions (Addendum)
Cody Coleman  10/23/2013   Your procedure is scheduled on:  10/25/2013  Report to [facility].  Follow the Signs to [place] at  0530      am  Call this number if you have problems the morning of surgery: [phone]   Remember:   Do not eat food or drink liquids after midnight.   Take these medicines the morning of surgery with A SIP OF WATER: xanax if needed, flomax             Bring colostomy supplies with you   Do not wear jewelry,   Do not wear lotions, powders, or perfumes deodorant.  . Men may shave face and neck.  Do not bring valuables to the hospital.  Contacts, dentures or bridgework may not be worn into surgery.  Leave suitcase in the car. After surgery it may be brought to your room.  For patients admitted to the hospital, checkout time is 11:00 AM the day of  discharge.           Please read over the following fact sheets that you were given: [facility] - Preparing for Surgery Before surgery, you can play an important role.  Because skin is not sterile, your skin needs to be as free of germs as possible.  You can reduce the number of germs on your skin by washing with CHG (chlorahexidine gluconate) soap before surgery.  CHG is an antiseptic cleaner which kills germs and bonds with the skin to continue killing germs even after washing. Please DO NOT use if you have an allergy to CHG or antibacterial soaps.  If your skin becomes reddened/irritated stop using the CHG and inform your nurse when you arrive at Short Stay. Do not shave (including legs and underarms) for at least 48 hours prior to the first CHG shower.  You may shave your face/neck. Please follow these instructions carefully:  1.  Shower with CHG Soap the night before surgery and the  morning of Surgery.  2.  If you choose to wash your hair, wash your hair first as usual with your  normal  shampoo.  3.  After you shampoo, rinse your hair and body thoroughly to remove the  shampoo.                            4.  Use CHG as you would any other liquid soap.  You can apply chg directly  to the skin and wash                       Gently with a scrungie or clean washcloth.  5.  Apply the CHG Soap to your body ONLY FROM THE NECK DOWN.   Do not use on face/ open                           Wound or open sores. Avoid contact with eyes, ears mouth and genitals (private parts).                       Wash face,  Genitals (private parts) with your normal soap.             6.  Wash thoroughly, paying special attention to the area where your surgery  will be performed.  7.  Thoroughly rinse your body with warm water  from the neck down.  8.  DO NOT shower/wash with your normal soap after using and rinsing off  the CHG Soap.                9.  Pat yourself dry with a clean towel.            10.  Wear clean pajamas.            11.  Place clean sheets on your bed the night of your first shower and do not  sleep with pets. Day of Surgery : Do not apply any lotions/deodorants the morning of surgery.  Please wear clean clothes to the hospital/surgery center.  FAILURE TO FOLLOW THESE INSTRUCTIONS MAY RESULT IN THE CANCELLATION OF YOUR SURGERY PATIENT SIGNATURE_________________________________  NURSE SIGNATURE__________________________________  ________________________________________________________________________ , coughing and deep breathing exercises, leg exercises

## 2013-10-24 ENCOUNTER — Encounter (HOSPITAL_COMMUNITY)
Admission: RE | Admit: 2013-10-24 | Discharge: 2013-10-24 | Disposition: A | Discharge status: Home / Self Care | Payer: Medicare Other | Source: Ambulatory Visit | Attending: General Surgery | Admitting: General Surgery

## 2013-10-24 ENCOUNTER — Encounter (HOSPITAL_COMMUNITY): Payer: Self-pay

## 2013-10-24 DIAGNOSIS — Z79899 Other long term (current) drug therapy: Secondary | ICD-10-CM | POA: Diagnosis not present

## 2013-10-24 DIAGNOSIS — Z23 Encounter for immunization: Secondary | ICD-10-CM | POA: Diagnosis not present

## 2013-10-24 DIAGNOSIS — IMO0002 Reserved for concepts with insufficient information to code with codable children: Secondary | ICD-10-CM | POA: Insufficient documentation

## 2013-10-24 DIAGNOSIS — Z6831 Body mass index (BMI) 31.0-31.9, adult: Secondary | ICD-10-CM | POA: Diagnosis not present

## 2013-10-24 DIAGNOSIS — Z01812 Encounter for preprocedural laboratory examination: Secondary | ICD-10-CM | POA: Insufficient documentation

## 2013-10-24 DIAGNOSIS — Z881 Allergy status to other antibiotic agents status: Secondary | ICD-10-CM | POA: Diagnosis not present

## 2013-10-24 DIAGNOSIS — E669 Obesity, unspecified: Secondary | ICD-10-CM | POA: Diagnosis present

## 2013-10-24 DIAGNOSIS — Z87891 Personal history of nicotine dependence: Secondary | ICD-10-CM | POA: Diagnosis not present

## 2013-10-24 DIAGNOSIS — Z8249 Family history of ischemic heart disease and other diseases of the circulatory system: Secondary | ICD-10-CM | POA: Diagnosis not present

## 2013-10-24 DIAGNOSIS — Z823 Family history of stroke: Secondary | ICD-10-CM | POA: Diagnosis not present

## 2013-10-24 DIAGNOSIS — E7800 Pure hypercholesterolemia, unspecified: Secondary | ICD-10-CM | POA: Diagnosis present

## 2013-10-24 DIAGNOSIS — I2510 Atherosclerotic heart disease of native coronary artery without angina pectoris: Secondary | ICD-10-CM | POA: Diagnosis present

## 2013-10-24 DIAGNOSIS — C20 Malignant neoplasm of rectum: Secondary | ICD-10-CM | POA: Diagnosis present

## 2013-10-24 DIAGNOSIS — I252 Old myocardial infarction: Secondary | ICD-10-CM | POA: Diagnosis not present

## 2013-10-24 DIAGNOSIS — Z951 Presence of aortocoronary bypass graft: Secondary | ICD-10-CM | POA: Diagnosis not present

## 2013-10-24 DIAGNOSIS — K560 Paralytic ileus: Secondary | ICD-10-CM | POA: Diagnosis not present

## 2013-10-24 LAB — CBC
HEMATOCRIT: 44.4 % (ref 39.0–52.0)
Hemoglobin: 15.5 g/dL (ref 13.0–17.0)
MCH: 31.5 pg (ref 26.0–34.0)
MCHC: 34.9 g/dL (ref 30.0–36.0)
MCV: 90.2 fL (ref 78.0–100.0)
PLATELETS: 144 10*3/uL — AB (ref 150–400)
RBC: 4.92 MIL/uL (ref 4.22–5.81)
RDW: 14.9 % (ref 11.5–15.5)
WBC: 7.6 10*3/uL (ref 4.0–10.5)

## 2013-10-24 LAB — BASIC METABOLIC PANEL
Anion gap: 14 (ref 5–15)
BUN: 18 mg/dL (ref 6–23)
CO2: 26 mEq/L (ref 19–32)
CREATININE: 0.77 mg/dL (ref 0.50–1.35)
Calcium: 9.1 mg/dL (ref 8.4–10.5)
Chloride: 101 mEq/L (ref 96–112)
GFR calc Af Amer: >90 mL/min (ref >90)
GFR calc non Af Amer: >90 mL/min (ref >90)
Glucose, Bld: 92 mg/dL (ref 70–99)
Potassium: 4.6 mEq/L (ref 3.7–5.3)
Sodium: 141 mEq/L (ref 137–147)

## 2013-10-25 ENCOUNTER — Inpatient Hospital Stay (HOSPITAL_COMMUNITY)
Admission: RE | Admit: 2013-10-25 | Discharge: 2013-10-30 | DRG: 348 | Disposition: A | Discharge status: Home / Self Care | Payer: Medicare Other | Source: Ambulatory Visit | Attending: General Surgery | Admitting: General Surgery

## 2013-10-25 ENCOUNTER — Encounter (HOSPITAL_COMMUNITY): Payer: Self-pay | Admitting: *Deleted

## 2013-10-25 ENCOUNTER — Ambulatory Visit (HOSPITAL_COMMUNITY): Payer: Medicare Other | Admitting: Registered Nurse

## 2013-10-25 ENCOUNTER — Encounter (HOSPITAL_COMMUNITY)
Admission: RE | Disposition: A | Discharge status: Home / Self Care | Payer: Self-pay | Source: Ambulatory Visit | Attending: General Surgery

## 2013-10-25 ENCOUNTER — Encounter (HOSPITAL_COMMUNITY): Payer: Medicare Other | Admitting: Registered Nurse

## 2013-10-25 DIAGNOSIS — Z8249 Family history of ischemic heart disease and other diseases of the circulatory system: Secondary | ICD-10-CM

## 2013-10-25 DIAGNOSIS — E7800 Pure hypercholesterolemia, unspecified: Secondary | ICD-10-CM | POA: Diagnosis present

## 2013-10-25 DIAGNOSIS — Z951 Presence of aortocoronary bypass graft: Secondary | ICD-10-CM

## 2013-10-25 DIAGNOSIS — IMO0002 Reserved for concepts with insufficient information to code with codable children: Principal | ICD-10-CM | POA: Diagnosis present

## 2013-10-25 DIAGNOSIS — I2510 Atherosclerotic heart disease of native coronary artery without angina pectoris: Secondary | ICD-10-CM | POA: Diagnosis not present

## 2013-10-25 DIAGNOSIS — K560 Paralytic ileus: Secondary | ICD-10-CM | POA: Diagnosis not present

## 2013-10-25 DIAGNOSIS — I252 Old myocardial infarction: Secondary | ICD-10-CM

## 2013-10-25 DIAGNOSIS — E669 Obesity, unspecified: Secondary | ICD-10-CM | POA: Diagnosis present

## 2013-10-25 DIAGNOSIS — Z881 Allergy status to other antibiotic agents status: Secondary | ICD-10-CM

## 2013-10-25 DIAGNOSIS — K6389 Other specified diseases of intestine: Secondary | ICD-10-CM | POA: Diagnosis not present

## 2013-10-25 DIAGNOSIS — Z6831 Body mass index (BMI) 31.0-31.9, adult: Secondary | ICD-10-CM

## 2013-10-25 DIAGNOSIS — Z87891 Personal history of nicotine dependence: Secondary | ICD-10-CM

## 2013-10-25 DIAGNOSIS — Z79899 Other long term (current) drug therapy: Secondary | ICD-10-CM

## 2013-10-25 DIAGNOSIS — Z823 Family history of stroke: Secondary | ICD-10-CM

## 2013-10-25 DIAGNOSIS — C20 Malignant neoplasm of rectum: Secondary | ICD-10-CM | POA: Diagnosis present

## 2013-10-25 DIAGNOSIS — Z23 Encounter for immunization: Secondary | ICD-10-CM

## 2013-10-25 DIAGNOSIS — K435 Parastomal hernia without obstruction or  gangrene: Secondary | ICD-10-CM | POA: Diagnosis present

## 2013-10-25 HISTORY — PX: LAPAROSCOPIC PARASTOMAL HERNIA: SHX6347

## 2013-10-25 HISTORY — PX: INSERTION OF MESH: SHX5868

## 2013-10-25 SURGERY — REPAIR, HERNIA, PARASTOMAL, LAPAROSCOPIC
Anesthesia: General | Site: Abdomen

## 2013-10-25 MED ORDER — INFLUENZA VAC SPLIT QUAD 0.5 ML IM SUSY
0.5000 mL | PREFILLED_SYRINGE | INTRAMUSCULAR | Status: AC
  Administered 2013-10-26: 0.5 mL via INTRAMUSCULAR
  Filled 2013-10-25 (×2): qty 0.5

## 2013-10-25 MED ORDER — PHENYLEPHRINE 40 MCG/ML (10ML) SYRINGE FOR IV PUSH (FOR BLOOD PRESSURE SUPPORT)
PREFILLED_SYRINGE | INTRAVENOUS | Status: AC
  Filled 2013-10-25: qty 10

## 2013-10-25 MED ORDER — ATORVASTATIN CALCIUM 40 MG PO TABS
40.0000 mg | ORAL_TABLET | Freq: Every day | ORAL | Status: DC
  Administered 2013-10-25 – 2013-10-29 (×5): 40 mg via ORAL
  Filled 2013-10-25 (×6): qty 1

## 2013-10-25 MED ORDER — MORPHINE SULFATE 2 MG/ML IJ SOLN
2.0000 mg | INTRAMUSCULAR | Status: DC | PRN
  Administered 2013-10-25: 4 mg via INTRAVENOUS
  Administered 2013-10-25: 2 mg via INTRAVENOUS
  Administered 2013-10-26: 4 mg via INTRAVENOUS
  Administered 2013-10-26: 2 mg via INTRAVENOUS
  Administered 2013-10-26 (×3): 4 mg via INTRAVENOUS
  Administered 2013-10-26 – 2013-10-27 (×2): 2 mg via INTRAVENOUS
  Administered 2013-10-27 (×2): 4 mg via INTRAVENOUS
  Administered 2013-10-29: 2 mg via INTRAVENOUS
  Filled 2013-10-25 (×2): qty 2
  Filled 2013-10-25: qty 1
  Filled 2013-10-25: qty 2
  Filled 2013-10-25: qty 1
  Filled 2013-10-25: qty 2
  Filled 2013-10-25: qty 1
  Filled 2013-10-25 (×2): qty 2
  Filled 2013-10-25: qty 1
  Filled 2013-10-25: qty 2
  Filled 2013-10-25: qty 1

## 2013-10-25 MED ORDER — ROCURONIUM BROMIDE 100 MG/10ML IV SOLN
INTRAVENOUS | Status: AC
  Filled 2013-10-25: qty 1

## 2013-10-25 MED ORDER — HYDROMORPHONE HCL 1 MG/ML IJ SOLN: 0.2500 mg | INTRAMUSCULAR | Status: DC | PRN

## 2013-10-25 MED ORDER — HYDROMORPHONE HCL 1 MG/ML IJ SOLN
INTRAMUSCULAR | Status: DC | PRN
  Administered 2013-10-25 (×3): 0.5 mg via INTRAVENOUS

## 2013-10-25 MED ORDER — MIDAZOLAM HCL 5 MG/5ML IJ SOLN
INTRAMUSCULAR | Status: DC | PRN
  Administered 2013-10-25 (×2): 1 mg via INTRAVENOUS

## 2013-10-25 MED ORDER — ROCURONIUM BROMIDE 100 MG/10ML IV SOLN
INTRAVENOUS | Status: DC | PRN
  Administered 2013-10-25: 5 mg via INTRAVENOUS
  Administered 2013-10-25: 10 mg via INTRAVENOUS
  Administered 2013-10-25: 20 mg via INTRAVENOUS
  Administered 2013-10-25: 60 mg via INTRAVENOUS
  Administered 2013-10-25: 5 mg via INTRAVENOUS
  Administered 2013-10-25: 20 mg via INTRAVENOUS

## 2013-10-25 MED ORDER — OXYCODONE-ACETAMINOPHEN 5-325 MG PO TABS
1.0000 | ORAL_TABLET | ORAL | Status: DC | PRN
  Administered 2013-10-27 – 2013-10-30 (×2): 1 via ORAL
  Filled 2013-10-25: qty 1
  Filled 2013-10-25: qty 2

## 2013-10-25 MED ORDER — LIDOCAINE HCL (CARDIAC) 20 MG/ML IV SOLN
INTRAVENOUS | Status: DC | PRN
  Administered 2013-10-25: 100 mg via INTRAVENOUS

## 2013-10-25 MED ORDER — LACTATED RINGERS IR SOLN
Status: DC | PRN
  Administered 2013-10-25: 1000 mL

## 2013-10-25 MED ORDER — ALPRAZOLAM 1 MG PO TABS
1.0000 mg | ORAL_TABLET | Freq: Four times a day (QID) | ORAL | Status: DC | PRN
  Administered 2013-10-26 – 2013-10-29 (×6): 1 mg via ORAL
  Filled 2013-10-25 (×6): qty 1

## 2013-10-25 MED ORDER — DEXAMETHASONE SODIUM PHOSPHATE 10 MG/ML IJ SOLN
INTRAMUSCULAR | Status: AC
  Filled 2013-10-25: qty 1

## 2013-10-25 MED ORDER — TAMSULOSIN HCL 0.4 MG PO CAPS
0.4000 mg | ORAL_CAPSULE | Freq: Every day | ORAL | Status: DC
  Administered 2013-10-26 – 2013-10-30 (×5): 0.4 mg via ORAL
  Filled 2013-10-25 (×6): qty 1

## 2013-10-25 MED ORDER — DOCUSATE SODIUM 100 MG PO CAPS
100.0000 mg | ORAL_CAPSULE | ORAL | Status: DC | PRN
  Filled 2013-10-25: qty 1

## 2013-10-25 MED ORDER — SODIUM CHLORIDE 0.9 % IR SOLN
Status: DC | PRN
  Administered 2013-10-25: 2000 mL

## 2013-10-25 MED ORDER — GLYCOPYRROLATE 0.2 MG/ML IJ SOLN
INTRAMUSCULAR | Status: AC
  Filled 2013-10-25: qty 4

## 2013-10-25 MED ORDER — ATROPINE SULFATE 0.4 MG/ML IJ SOLN
INTRAMUSCULAR | Status: AC
  Filled 2013-10-25: qty 2

## 2013-10-25 MED ORDER — GLYCOPYRROLATE 0.2 MG/ML IJ SOLN
INTRAMUSCULAR | Status: DC | PRN
  Administered 2013-10-25: .8 mg via INTRAVENOUS

## 2013-10-25 MED ORDER — CLINDAMYCIN PHOSPHATE 900 MG/50ML IV SOLN
900.0000 mg | INTRAVENOUS | Status: AC
  Administered 2013-10-25: 900 mg via INTRAVENOUS

## 2013-10-25 MED ORDER — ONDANSETRON HCL 4 MG/2ML IJ SOLN
4.0000 mg | Freq: Four times a day (QID) | INTRAMUSCULAR | Status: DC | PRN
  Administered 2013-10-27: 4 mg via INTRAVENOUS
  Filled 2013-10-25: qty 2

## 2013-10-25 MED ORDER — PROPOFOL 10 MG/ML IV BOLUS
INTRAVENOUS | Status: DC | PRN
  Administered 2013-10-25: 200 mg via INTRAVENOUS

## 2013-10-25 MED ORDER — SODIUM CHLORIDE 0.9 % IV SOLN
INTRAVENOUS | Status: DC
  Administered 2013-10-25 – 2013-10-28 (×6): via INTRAVENOUS

## 2013-10-25 MED ORDER — ONDANSETRON HCL 4 MG PO TABS: 4.0000 mg | ORAL_TABLET | Freq: Four times a day (QID) | ORAL | Status: DC | PRN

## 2013-10-25 MED ORDER — DEXAMETHASONE SODIUM PHOSPHATE 10 MG/ML IJ SOLN
INTRAMUSCULAR | Status: DC | PRN
  Administered 2013-10-25: 10 mg via INTRAVENOUS

## 2013-10-25 MED ORDER — PROPOFOL 10 MG/ML IV BOLUS
INTRAVENOUS | Status: AC
  Filled 2013-10-25: qty 20

## 2013-10-25 MED ORDER — ONDANSETRON HCL 4 MG/2ML IJ SOLN
INTRAMUSCULAR | Status: DC | PRN
  Administered 2013-10-25: 4 mg via INTRAVENOUS

## 2013-10-25 MED ORDER — NEOSTIGMINE METHYLSULFATE 10 MG/10ML IV SOLN
INTRAVENOUS | Status: AC
  Filled 2013-10-25: qty 1

## 2013-10-25 MED ORDER — FENTANYL CITRATE 0.05 MG/ML IJ SOLN
INTRAMUSCULAR | Status: DC | PRN
  Administered 2013-10-25 (×5): 50 ug via INTRAVENOUS

## 2013-10-25 MED ORDER — NEOSTIGMINE METHYLSULFATE 10 MG/10ML IV SOLN
INTRAVENOUS | Status: DC | PRN
  Administered 2013-10-25: 5 mg via INTRAVENOUS

## 2013-10-25 MED ORDER — IBUPROFEN 200 MG PO TABS
600.0000 mg | ORAL_TABLET | Freq: Four times a day (QID) | ORAL | Status: DC | PRN
  Administered 2013-10-27 – 2013-10-29 (×2): 600 mg via ORAL
  Filled 2013-10-25 (×2): qty 3

## 2013-10-25 MED ORDER — MIDAZOLAM HCL 2 MG/2ML IJ SOLN
INTRAMUSCULAR | Status: AC
  Filled 2013-10-25: qty 2

## 2013-10-25 MED ORDER — HYDROMORPHONE HCL 2 MG/ML IJ SOLN
INTRAMUSCULAR | Status: AC
  Filled 2013-10-25: qty 1

## 2013-10-25 MED ORDER — EPHEDRINE SULFATE 50 MG/ML IJ SOLN
INTRAMUSCULAR | Status: DC | PRN
  Administered 2013-10-25: 5 mg via INTRAVENOUS

## 2013-10-25 MED ORDER — BUPIVACAINE-EPINEPHRINE (PF) 0.25% -1:200000 IJ SOLN
INTRAMUSCULAR | Status: DC | PRN
  Administered 2013-10-25: 43 mL

## 2013-10-25 MED ORDER — CLINDAMYCIN PHOSPHATE 900 MG/50ML IV SOLN
INTRAVENOUS | Status: AC
  Filled 2013-10-25: qty 50

## 2013-10-25 MED ORDER — LACTATED RINGERS IV SOLN
INTRAVENOUS | Status: DC | PRN
Start: 2013-10-25 — End: 2013-10-25
  Administered 2013-10-25: 07:00:00 via INTRAVENOUS

## 2013-10-25 MED ORDER — EPHEDRINE SULFATE 50 MG/ML IJ SOLN
INTRAMUSCULAR | Status: AC
  Filled 2013-10-25: qty 1

## 2013-10-25 MED ORDER — LACTATED RINGERS IV SOLN: INTRAVENOUS | Status: DC

## 2013-10-25 MED ORDER — SODIUM CHLORIDE 0.9 % IJ SOLN
INTRAMUSCULAR | Status: AC
  Filled 2013-10-25: qty 10

## 2013-10-25 MED ORDER — LIDOCAINE HCL (CARDIAC) 20 MG/ML IV SOLN
INTRAVENOUS | Status: AC
  Filled 2013-10-25: qty 5

## 2013-10-25 MED ORDER — FENTANYL CITRATE 0.05 MG/ML IJ SOLN
INTRAMUSCULAR | Status: AC
  Filled 2013-10-25: qty 5

## 2013-10-25 MED ORDER — PHENYLEPHRINE HCL 10 MG/ML IJ SOLN
INTRAMUSCULAR | Status: DC | PRN
  Administered 2013-10-25: 40 ug via INTRAVENOUS

## 2013-10-25 MED ORDER — ONDANSETRON HCL 4 MG/2ML IJ SOLN
INTRAMUSCULAR | Status: AC
  Filled 2013-10-25: qty 2

## 2013-10-25 MED ORDER — ENOXAPARIN SODIUM 40 MG/0.4ML ~~LOC~~ SOLN
40.0000 mg | SUBCUTANEOUS | Status: DC
  Administered 2013-10-26 – 2013-10-30 (×5): 40 mg via SUBCUTANEOUS
  Filled 2013-10-25 (×6): qty 0.4

## 2013-10-25 SURGICAL SUPPLY — 90 items
ADH SKN CLS APL DERMABOND .7 (GAUZE/BANDAGES/DRESSINGS) ×1
APPLIER CLIP 5 13 M/L LIGAMAX5 (MISCELLANEOUS)
APPLIER CLIP ROT 10 11.4 M/L (STAPLE)
APR CLP MED LRG 11.4X10 (STAPLE)
APR CLP MED LRG 5 ANG JAW (MISCELLANEOUS)
CANISTER SUCTION 2500CC (MISCELLANEOUS) ×3 IMPLANT
CANNULA REDUC XI 12-8 STAPL (CANNULA) ×1
CANNULA REDUC XI 12-8MM STAPL (CANNULA) ×1
CANNULA REDUCER 12-8 DVNC XI (CANNULA) IMPLANT
CELLS DAT CNTRL 66122 CELL SVR (MISCELLANEOUS) IMPLANT
CLIP APPLIE 5 13 M/L LIGAMAX5 (MISCELLANEOUS) IMPLANT
CLIP APPLIE ROT 10 11.4 M/L (STAPLE) IMPLANT
COVER SURGICAL LIGHT HANDLE (MISCELLANEOUS) ×3 IMPLANT
DECANTER SPIKE VIAL GLASS SM (MISCELLANEOUS) ×1 IMPLANT
DERMABOND ADVANCED (GAUZE/BANDAGES/DRESSINGS) ×2
DERMABOND ADVANCED .7 DNX12 (GAUZE/BANDAGES/DRESSINGS) IMPLANT
DRAPE ARM DVNC X/XI (DISPOSABLE) IMPLANT
DRAPE COLUMN DVNC XI (DISPOSABLE) IMPLANT
DRAPE DA VINCI XI ARM (DISPOSABLE) ×10
DRAPE DA VINCI XI COLUMN (DISPOSABLE) ×2
DRAPE LAPAROSCOPIC ABDOMINAL (DRAPES) ×1 IMPLANT
DRAPE UTILITY 15X26 W/TAPE STR (DRAPE) ×2 IMPLANT
DRAPE UTILITY XL STRL (DRAPES) ×3 IMPLANT
DRAPE WARM FLUID 44X44 (DRAPE) ×3 IMPLANT
ELECT CAUTERY BLADE 6.4 (BLADE) ×6 IMPLANT
ELECT REM PT RETURN 9FT ADLT (ELECTROSURGICAL) ×3
ELECTRODE REM PT RTRN 9FT ADLT (ELECTROSURGICAL) ×1 IMPLANT
FILTER SMOKE EVAC LAPAROSHD (FILTER) IMPLANT
GAUZE SPONGE 4X4 12PLY STRL (GAUZE/BANDAGES/DRESSINGS) IMPLANT
GLOVE BIO SURGEON STRL SZ 6.5 (GLOVE) ×2 IMPLANT
GLOVE BIO SURGEONS STRL SZ 6.5 (GLOVE) ×1
GLOVE BIOGEL PI IND STRL 7.0 (GLOVE) ×1 IMPLANT
GLOVE BIOGEL PI INDICATOR 7.0 (GLOVE) ×2
GOWN STRL REUS W/ TWL LRG LVL3 (GOWN DISPOSABLE) ×3 IMPLANT
GOWN STRL REUS W/TWL 2XL LVL3 (GOWN DISPOSABLE) ×3 IMPLANT
GOWN STRL REUS W/TWL LRG LVL3 (GOWN DISPOSABLE)
KIT BASIN OR (CUSTOM PROCEDURE TRAY) ×3 IMPLANT
LEGGING LITHOTOMY PAIR STRL (DRAPES) ×1 IMPLANT
LIGASURE IMPACT 36 18CM CVD LR (INSTRUMENTS) IMPLANT
NDL SPNL 22GX3.5 QUINCKE BK (NEEDLE) IMPLANT
NEEDLE SPNL 22GX3.5 QUINCKE BK (NEEDLE) ×3 IMPLANT
NS IRRIG 1000ML POUR BTL (IV SOLUTION) ×6 IMPLANT
PACK CARDIOVASCULAR III (CUSTOM PROCEDURE TRAY) ×2 IMPLANT
PACK GENERAL/GYN (CUSTOM PROCEDURE TRAY) ×2 IMPLANT
PENCIL BUTTON HOLSTER BLD 10FT (ELECTRODE) ×1 IMPLANT
RETRACTOR WND ALEXIS 18 MED (MISCELLANEOUS) IMPLANT
RTRCTR WOUND ALEXIS 18CM MED (MISCELLANEOUS)
SEAL CANN UNIV 5-8 DVNC XI (MISCELLANEOUS) IMPLANT
SEAL XI 5MM-8MM UNIVERSAL (MISCELLANEOUS) ×8
SEALER TISSUE G2 CVD JAW 35 (ENDOMECHANICALS) IMPLANT
SEALER TISSUE G2 CVD JAW 45CM (ENDOMECHANICALS)
SET IRRIG TUBING LAPAROSCOPIC (IRRIGATION / IRRIGATOR) IMPLANT
SLEEVE ENDOPATH XCEL 5M (ENDOMECHANICALS) ×1 IMPLANT
SPONGE LAP 18X18 X RAY DECT (DISPOSABLE) IMPLANT
STAPLER CANNULA SEAL DVNC XI (STAPLE) IMPLANT
STAPLER CANNULA SEAL XI (STAPLE) ×2
STAPLER VISISTAT 35W (STAPLE) ×3 IMPLANT
SUCTION POOLE TIP (SUCTIONS) ×3 IMPLANT
SUT CHROMIC 0 BP (SUTURE) ×2 IMPLANT
SUT NOVA 1 T20/GS 25DT (SUTURE) ×4 IMPLANT
SUT PDS AB 1 TP1 96 (SUTURE) ×2 IMPLANT
SUT PROLENE 2 0 KS (SUTURE) ×2 IMPLANT
SUT SILK 2 0 (SUTURE)
SUT SILK 2 0 SH CR/8 (SUTURE) ×3 IMPLANT
SUT SILK 2-0 18XBRD TIE 12 (SUTURE) ×1 IMPLANT
SUT SILK 3 0 (SUTURE)
SUT SILK 3 0 SH CR/8 (SUTURE) ×3 IMPLANT
SUT SILK 3-0 18XBRD TIE 12 (SUTURE) ×1 IMPLANT
SUT VIC AB 2-0 SH 27 (SUTURE) ×9
SUT VIC AB 2-0 SH 27X BRD (SUTURE) IMPLANT
SUT VIC AB 3-0 SH 18 (SUTURE) IMPLANT
SUT VIC AB 4-0 PS2 27 (SUTURE) ×4 IMPLANT
SUT VICRYL 0 UR6 27IN ABS (SUTURE) ×2 IMPLANT
SUT VLOC 180 2-0 9IN GS21 (SUTURE) ×4 IMPLANT
SYS LAPSCP GELPORT 120MM (MISCELLANEOUS)
SYSTEM LAPSCP GELPORT 120MM (MISCELLANEOUS) IMPLANT
TISSUE MATRIX STRATTICE 16X20 (Tissue) ×3 IMPLANT
TISSUE MATRIX STRATTICE 20X16 (Tissue) IMPLANT
TOWEL OR 17X24 6PK STRL BLUE (TOWEL DISPOSABLE) ×1 IMPLANT
TOWEL OR 17X26 10 PK STRL BLUE (TOWEL DISPOSABLE) ×6 IMPLANT
TRAY FOLEY CATH 14FRSI W/METER (CATHETERS) ×1 IMPLANT
TRAY FOLEY METER SIL LF 16FR (CATHETERS) ×2 IMPLANT
TRAY LAP CHOLE (CUSTOM PROCEDURE TRAY) ×1 IMPLANT
TRAY LAPAROSCOPIC (CUSTOM PROCEDURE TRAY) ×1 IMPLANT
TROCAR XCEL NON-BLD 11X100MML (ENDOMECHANICALS) IMPLANT
TROCAR XCEL NON-BLD 5MMX100MML (ENDOMECHANICALS) ×3 IMPLANT
TUBE CONNECTING 12'X1/4 (SUCTIONS)
TUBE CONNECTING 12X1/4 (SUCTIONS) ×1 IMPLANT
TUBING FILTER THERMOFLATOR (ELECTROSURGICAL) ×3 IMPLANT
YANKAUER SUCT BULB TIP NO VENT (SUCTIONS) ×4 IMPLANT

## 2013-10-25 NOTE — Anesthesia Postprocedure Evaluation (Signed)
  Anesthesia Post-op Note  Patient: Cody Coleman  Procedure(s) Performed: Procedure(s) (LRB): ROBOTIC PARASTOMAL HERNIA REPAIR WITH STRATTICE MESH (N/A) INSERTION OF STRATTICE MESH (N/A)  Patient Location: PACU  Anesthesia Type: General  Level of Consciousness: awake and alert   Airway and Oxygen Therapy: Patient Spontanous Breathing  Post-op Pain: mild  Post-op Assessment: Post-op Vital signs reviewed, Patient's Cardiovascular Status Stable, Respiratory Function Stable, Patent Airway and No signs of Nausea or vomiting  Last Vitals:  Filed Vitals:   10/25/13 1330  BP:   Pulse:   Temp: 36.4 C  Resp: 12    Post-op Vital Signs: stable   Complications: No apparent anesthesia complications

## 2013-10-25 NOTE — H&P (Signed)
HISTORY: Cody Coleman is a 68 y.o. male who presents to the office with a parastomal hernia. He is also here for a follow up visit regarding his rectal cancer (T3N0, MSI neg). He is a 67 y.o. male who is status post a APR on 06/11/12. He is status post repair of a small parastomal hernia in Dec 2014. Unfortunately, he got the flu right after this and his coughing ruptured the suture repair. His ostomy is functioning well. He is having regular bowel movements. He's having some pouching difficulty. He denies any weight loss or abdominal pain. He's not having any blood in his stools.  Past Medical History   Diagnosis  Date   .  CAD (coronary artery disease)    .  High cholesterol    .  Urgency-frequency syndrome      FLOMAX HAS HELPED   .  MI (myocardial infarction)      x 2 AT AGE 59 AND AT AGE 40 DR. [facility] IS PT'S MEDICAL DOCTOR   .  Cancer      RECTAL CANCER--SOME RECTAL BLEEDING   .  Pain      RIGHT KNEE PAIN AND OCCAS OTHER JOINT PAINS   .  Sleep difficulties      NEVER SLEEPS WELL - WAKES UP AFTER SEVERAL HOURS AND NOT ABLE TO GO BACK TO SLEEP--DID SLEEP STUDY AND TOLD HE DID NOT HAVE SLEEP APNEA   .  Former tobacco use    .  BPH (benign prostatic hyperplasia)     Past Surgical History   Procedure  Laterality  Date   .  Coronary artery bypass graft       2005   .  Tonsillectomy     .  Coronary angioplasty with stent placement       before bypass   .  Colonoscopy  N/A  04/20/2012     Procedure: COLONOSCOPY; Surgeon: Rogene Houston, MD; [facility]; Service: Endoscopy; Laterality: N/A; 225   .  Eus  N/A  05/03/2012     Procedure: LOWER ENDOSCOPIC ULTRASOUND (EUS); Surgeon: Milus Banister, MD; Location: [facility]; Service: Endoscopy; Laterality: N/A;   .  Laparoscopic assisted abdominal perineal resection  N/A  06/11/2012     Procedure: LAPAROSCOPIC ASSISTED ABDOMINAL PERINEAL RESECTION; Surgeon: Leighton Ruff, MD; Location: [facility]; Service: General; Laterality: N/A;    .  Colostomy revision  N/A  01/17/2013     Procedure: COLOSTOMY REVISION; Surgeon: Leighton Ruff, MD; Location: [place]; Service: General; Laterality: N/A;   .  Colonoscopy  N/A  06/14/2013     Procedure: COLONOSCOPY; Surgeon: Rogene Houston, MD; [facility]; Service: Endoscopy; Laterality: N/A; 1245    Current Outpatient Prescriptions   Medication  Sig  Dispense  Refill   .  ALPRAZolam (XANAX) 1 MG tablet  Take 1 mg by mouth 3 (three) times daily as needed for sleep or anxiety. USUALLY WOULD ONLY TAKE 1/2 TAB IF NEEDED FOR SLEEP OR ANXIETY     .  atorvastatin (LIPITOR) 40 MG tablet  1 tablet daily.  30 tablet  11   .  docusate sodium (COLACE) 100 MG capsule  Take 100 mg by mouth as needed for constipation.     .  tamsulosin (FLOMAX) 0.4 MG CAPS  Take 0.4 mg by mouth daily after breakfast.      No current facility-administered medications for this visit.    Allergies   Allergen  Reactions   .  Augmentin [Amoxicillin-Pot Clavulanate]  Swelling and Rash    Family History   Problem  Relation  Age of Onset   .  Colon cancer  Neg Hx    .  Congestive Heart Failure  Mother    .  Stroke  Father    .  Coronary artery disease  Brother    .  Coronary artery disease  Father     History    Social History   .  Marital Status:  Married     Spouse Name:  N/A     Number of Children:  N/A   .  Years of Education:  N/A    Social History Main Topics   .  Smoking status:  Former Smoker -- 1.00 packs/day for 30 years     Types:  Cigarettes     Quit date:  01/31/2002   .  Smokeless tobacco:  Never Used      Comment: quit 11yr ago   .  Alcohol Use:  15.0 oz/week     25 Shots of liquor per week      Comment: drinks 2-3 drinks on occasion   .  Drug Use:  No   .  Sexual Activity:  No    Other Topics  Concern   .  None    Social History Narrative    Lives with wife. Drives, no assist devices.   REVIEW OF SYSTEMS - PERTINENT POSITIVES ONLY:  Review of Systems - General ROS:  negative for - chills, fever or weight loss  Respiratory ROS: no cough, shortness of breath, or wheezing  Cardiovascular ROS: no chest pain or dyspnea on exertion  Gastrointestinal ROS: no abdominal pain, change in bowel habits, or black or bloody stools  Genito-Urinary ROS: no dysuria, trouble voiding, or hematuria  EXAM:  Filed Vitals:   10/25/13 0538  BP: 109/81  Pulse: 106  Temp: 97.7 F (36.5 C)  Resp: 18   General appearance: alert and cooperative  Resp: clear to auscultation bilaterally  Cardio: regular rate and rhythm  GI: normal findings: soft, non-tender  Large parastomal hernia present, ostomy functioning  LABORATORY RESULTS:  Available labs are reviewed    ASSESSMENT AND PLAN:  Cody Coleman a 68y.o. M with a h/o Stage II rectal cancer who presents to the office in followup. I do not see any signs of recurrent rectal cancer. We discussed his parastomal hernia. I think that given that it is enlarging at this time and he is having trouble with belching, I have recommended repair with mesh. I believe we can do this laparoscopically. We discussed the risk and benefits of surgery including bleeding, infection, mesh erosion and mesh infections as well as recurrence rates which with parastomal hernias can be as high as 15-20%, but I suspect his personal recurrence rate to be about 10%. We discussed using biologic mesh versus synthetic.We discussed doing the surgery robotically.  He understands that this will not change any key aspects of the case.  I will obtain a repeat CEA during his hospitalization to follow up on his rectal cancer.  ARosario Adie MD  Colon and Rectal Surgery / [facility]

## 2013-10-25 NOTE — Anesthesia Preprocedure Evaluation (Addendum)
Anesthesia Evaluation  Patient identified by MRN, date of birth, ID band Patient awake    Reviewed: Allergy & Precautions, H&P , NPO status , Patient's Chart, lab work & pertinent test results, reviewed documented beta blocker date and time   Airway Mallampati: II TM Distance: >3 FB Neck ROM: Full    Dental no notable dental hx. (+) Teeth Intact, Dental Advisory Given   Pulmonary COPDformer smoker,  History acute respiratory failure breath sounds clear to auscultation  Pulmonary exam normal       Cardiovascular + CAD, + Past MI, + Cardiac Stents and + CABG Rhythm:Regular Rate:Normal  Cardiology clearance note from 05-29-12 reviewed.  No cardiopulmonary symptoms since CABG in 2005   Neuro/Psych negative neurological ROS  negative psych ROS   GI/Hepatic negative GI ROS, Neg liver ROS,   Endo/Other  negative endocrine ROS  Renal/GU negative Renal ROS  negative genitourinary   Musculoskeletal negative musculoskeletal ROS (+)   Abdominal (+) + obese,   Peds negative pediatric ROS (+)  Hematology negative hematology ROS (+)   Anesthesia Other Findings   Reproductive/Obstetrics negative OB ROS                         Anesthesia Physical Anesthesia Plan  ASA: III  Anesthesia Plan: General   Post-op Pain Management:    Induction: Intravenous  Airway Management Planned: Oral ETT  Additional Equipment:   Intra-op Plan:   Post-operative Plan: Extubation in OR  Informed Consent: I have reviewed the patients History and Physical, chart, labs and discussed the procedure including the risks, benefits and alternatives for the proposed anesthesia with the patient or authorized representative who has indicated his/her understanding and acceptance.   Dental Advisory Given  Plan Discussed with: CRNA and Surgeon  Anesthesia Plan Comments:         Anesthesia Quick Evaluation

## 2013-10-25 NOTE — Op Note (Signed)
10/25/2013  12:25 PM  PATIENT:  Cody Coleman  68 y.o. male  Patient Care Team: Elsie Lincoln, MD as PCP - General (Family Medicine) Leighton Ruff, MD as Consulting Physician (General Surgery)  PRE-OPERATIVE DIAGNOSIS:  parastomal hernia  POST-OPERATIVE DIAGNOSIS:  parastomal hernia  PROCEDURE:  ROBOTIC PARASTOMAL HERNIA REPAIR WITH STRATTICE MESH   SURGEON:  Surgeon(s): Leighton Ruff, MD Michael Boston, MD  ASSISTANT: Johney Maine   ANESTHESIA:   local and general  EBL:  Total I/O In: 2000 [I.V.:2000] Out: 165 [Urine:150; Blood:15]  Delay start of Pharmacological VTE agent (>24hrs) due to surgical blood loss or risk of bleeding:  no  DRAINS: none   SPECIMEN:  No Specimen  DISPOSITION OF SPECIMEN:  N/A  COUNTS:  YES  PLAN OF CARE: Admit for overnight observation  PATIENT DISPOSITION:  PACU - hemodynamically stable.  INDICATION: Pleasant patient has developed a ventral wall abdominal hernia.   Recommendation was made for surgical repair:  The anatomy & physiology of the abdominal wall was discussed. The pathophysiology of hernias was discussed. Natural history risks without surgery including progeressive enlargement, pain, incarceration & strangulation was discussed. Contributors to complications such as smoking, obesity, diabetes, prior surgery, etc were discussed.  I feel the risks of no intervention will lead to serious problems that outweigh the operative risks; therefore, I recommended surgery to reduce and repair the hernia. I explained laparoscopic techniques with possible need for an open approach. I noted the probable use of mesh to patch and/or buttress the hernia repair  Risks such as bleeding, infection, abscess, need for further treatment, heart attack, death, and other risks were discussed. I noted a good likelihood this will help address the problem. Goals of post-operative recovery were discussed as well. Possibility that this will not correct all symptoms  was explained. I stressed the importance of low-impact activity, aggressive pain control, avoiding constipation, & not pushing through pain to minimize risk of post-operative chronic pain or injury. Possibility of reherniation especially with smoking, obesity, diabetes, immunosuppression, and other health conditions was discussed. We will work to minimize complications.  An educational handout further explaining the pathology & treatment options was given as well. Questions were answered. The patient expresses understanding & wishes to proceed with surgery.   OR FINDINGS: parastomal hernia contaiing omentum.  6 cm defect   Type of repair - Robotic underlay repair    Name of mesh - Laparoscopic Strattice Mesh   Size of mesh - Length 20 cm, Width 16 cm  Mesh overlap - 7 cm  Placement of mesh - Intraperitoneal underlay repair with suture fixation   DESCRIPTION:   Informed consent was confirmed. The patient underwent general anaesthesia without difficulty. The patient was positioned appropriately. VTE prevention in place. The patient's abdomen was clipped, prepped, & draped in a sterile fashion. Surgical timeout confirmed our plan. The patient was positioned in reverse Trendelenburg. Abdominal entry was gained using a Veress needle in the right upper abdomen. Entry was clean. I induced carbon dioxide insufflation.  I placed an 14mm port in the right lateral sidewall.  Camera inspection revealed no injury at entry sites. Two other robotic ports were carefully placed under direct laparoscopic visualization on the R side.   I could see the hernia in the lower abdomen.  The robot was docked on the left side.  I did a robotic assisted lysis of adhesions to expose the entire anterior abdominal wall.  I primarily used and focused cold scissors. I removed the hernia sac.  I made sure hemostasis was good.  I mapped out the region using a ruler.   To ensure that I would have at least 5 cm radial coverage  outside of the hernia defect, I chose a 16x20 cm dual sided mesh.  I placed #1 Novofil stitches around its edge about every 5 cm = 12 total.  I rolled the mesh & placed into the peritoneal cavity through the 12 cm RUQ fascial defect.  I unrolled  the mesh and positioned it appropriately.  I secured the mesh to cover up the hernia defect using a laparoscopic suture passer to pass the tails of the Prolene through the abdominal wall & tagged them with clamps.  I started out in four corners to make sure I had the mesh centered over the hernia defect appropriately, and then proceeded to work in quadrants.  We evacuated CO2 & desufflated the abdomen.  I tied the fascial stitches down.  I reinsufflated the abdomen.  The mesh provided at least 5-10 cm circumferential coverage around the entire region of hernia defects.   I secured the edges & central part of the mesh to the peritoneum/posterior rectus fascia with a running 2-0 Vicryl suture.   Hemostasis was excellent. Mesh laid well. Capnoperitoneum was evacuated. Ports were removed.   I closed the fascia port site on the 12 mm port using a 0 Vicryl stitch.  The skin was closed with 4-0 Vicryl at the port sites and Dermabond on the fascial stitch puncture sites.  Patient is being extubated to go to the recovery room. I'm about discussed operative findings with the patient's family.

## 2013-10-25 NOTE — Transfer of Care (Signed)
Immediate Anesthesia Transfer of Care Note  Patient: Cody Coleman  Procedure(s) Performed: Procedure(s): ROBOTIC PARASTOMAL HERNIA REPAIR WITH STRATTICE MESH (N/A) INSERTION OF STRATTICE MESH (N/A)  Patient Location: PACU  Anesthesia Type:General  Level of Consciousness: awake, alert , oriented and patient cooperative  Airway & Oxygen Therapy: Patient Spontanous Breathing and Patient connected to face mask oxygen  Post-op Assessment: Report given to PACU RN, Post -op Vital signs reviewed and stable and Patient moving all extremities  Post vital signs: Reviewed and stable  Complications: No apparent anesthesia complications

## 2013-10-26 DIAGNOSIS — C20 Malignant neoplasm of rectum: Secondary | ICD-10-CM | POA: Diagnosis present

## 2013-10-26 DIAGNOSIS — Z823 Family history of stroke: Secondary | ICD-10-CM | POA: Diagnosis not present

## 2013-10-26 DIAGNOSIS — E7800 Pure hypercholesterolemia, unspecified: Secondary | ICD-10-CM | POA: Diagnosis present

## 2013-10-26 DIAGNOSIS — Z951 Presence of aortocoronary bypass graft: Secondary | ICD-10-CM | POA: Diagnosis not present

## 2013-10-26 DIAGNOSIS — Z87891 Personal history of nicotine dependence: Secondary | ICD-10-CM | POA: Diagnosis not present

## 2013-10-26 DIAGNOSIS — I2510 Atherosclerotic heart disease of native coronary artery without angina pectoris: Secondary | ICD-10-CM | POA: Diagnosis present

## 2013-10-26 DIAGNOSIS — I252 Old myocardial infarction: Secondary | ICD-10-CM | POA: Diagnosis not present

## 2013-10-26 DIAGNOSIS — Z79899 Other long term (current) drug therapy: Secondary | ICD-10-CM | POA: Diagnosis not present

## 2013-10-26 DIAGNOSIS — Z881 Allergy status to other antibiotic agents status: Secondary | ICD-10-CM | POA: Diagnosis not present

## 2013-10-26 DIAGNOSIS — Z6831 Body mass index (BMI) 31.0-31.9, adult: Secondary | ICD-10-CM | POA: Diagnosis not present

## 2013-10-26 DIAGNOSIS — Z8249 Family history of ischemic heart disease and other diseases of the circulatory system: Secondary | ICD-10-CM | POA: Diagnosis not present

## 2013-10-26 DIAGNOSIS — Z23 Encounter for immunization: Secondary | ICD-10-CM | POA: Diagnosis not present

## 2013-10-26 DIAGNOSIS — E669 Obesity, unspecified: Secondary | ICD-10-CM | POA: Diagnosis present

## 2013-10-26 DIAGNOSIS — IMO0002 Reserved for concepts with insufficient information to code with codable children: Secondary | ICD-10-CM | POA: Diagnosis present

## 2013-10-26 DIAGNOSIS — K560 Paralytic ileus: Secondary | ICD-10-CM | POA: Diagnosis not present

## 2013-10-26 LAB — BASIC METABOLIC PANEL
ANION GAP: 12 (ref 5–15)
BUN: 15 mg/dL (ref 6–23)
CO2: 26 mEq/L (ref 19–32)
Calcium: 8.8 mg/dL (ref 8.4–10.5)
Chloride: 101 mEq/L (ref 96–112)
Creatinine, Ser: 0.72 mg/dL (ref 0.50–1.35)
GFR calc non Af Amer: >90 mL/min (ref >90)
Glucose, Bld: 130 mg/dL — ABNORMAL HIGH (ref 70–99)
Potassium: 4.6 mEq/L (ref 3.7–5.3)
Sodium: 139 mEq/L (ref 137–147)

## 2013-10-26 LAB — CBC
HCT: 40.4 % (ref 39.0–52.0)
Hemoglobin: 13.9 g/dL (ref 13.0–17.0)
MCH: 30.9 pg (ref 26.0–34.0)
MCHC: 34.4 g/dL (ref 30.0–36.0)
MCV: 89.8 fL (ref 78.0–100.0)
Platelets: 142 10*3/uL — ABNORMAL LOW (ref 150–400)
RBC: 4.5 MIL/uL (ref 4.22–5.81)
RDW: 14.9 % (ref 11.5–15.5)
WBC: 14.6 10*3/uL — ABNORMAL HIGH (ref 4.0–10.5)

## 2013-10-26 LAB — CEA: CEA: 2.3 ng/mL (ref 0.0–5.0)

## 2013-10-26 NOTE — Progress Notes (Signed)
UR completed 

## 2013-10-26 NOTE — Progress Notes (Signed)
1 Day Post-Op  Subjective: Having pain controlled with meds.  Feels bloated.  No nausea.  No flatus or stool per colostomy.  Objective: Vital signs in last 24 hours: Temp:  [97.5 F (36.4 C)-98.2 F (36.8 C)] 98.1 F (36.7 C) (09/26 0535) Pulse Rate:  [64-106] 64 (09/26 0535) Resp:  [12-18] 18 (09/26 0535) BP: (108-145)/(63-86) 123/70 mmHg (09/26 0535) SpO2:  [95 %-100 %] 96 % (09/26 0535) Last BM Date: 10/24/13  Intake/Output from previous day: 09/25 0701 - 09/26 0700 In: 3666.3 [I.V.:3666.3] Out: 1415 [Urine:1400; Blood:15] Intake/Output this shift: Total I/O In: 480 [P.O.:480] Out: 1000 [Urine:1000]  PE: General- In NAD Abdomen-slight firm and distended, few bowel sounds, incisions clean and intact, colostomy pink with no output  Lab Results:   Recent Labs  10/24/13 1415 10/26/13 0522  WBC 7.6 14.6*  HGB 15.5 13.9  HCT 44.4 40.4  PLT 144* 142*   BMET  Recent Labs  10/24/13 1415 10/26/13 0522  NA 141 139  K 4.6 4.6  CL 101 101  CO2 26 26  GLUCOSE 92 130*  BUN 18 15  CREATININE 0.77 0.72  CALCIUM 9.1 8.8   PT/INR No results found for this basename: LABPROT, INR,  in the last 72 hours Comprehensive Metabolic Panel:    Component Value Date/Time   NA 139 10/26/2013 0522   NA 141 10/24/2013 1415   K 4.6 10/26/2013 0522   K 4.6 10/24/2013 1415   CL 101 10/26/2013 0522   CL 101 10/24/2013 1415   CO2 26 10/26/2013 0522   CO2 26 10/24/2013 1415   BUN 15 10/26/2013 0522   BUN 18 10/24/2013 1415   CREATININE 0.72 10/26/2013 0522   CREATININE 0.77 10/24/2013 1415   CREATININE 0.76 04/24/2012 1638   GLUCOSE 130* 10/26/2013 0522   GLUCOSE 92 10/24/2013 1415   CALCIUM 8.8 10/26/2013 0522   CALCIUM 9.1 10/24/2013 1415   AST 32 06/10/2013 1631   AST 63* 01/16/2013 0829   ALT 35 06/10/2013 1631   ALT 57* 01/16/2013 0829   ALKPHOS 56 06/10/2013 1631   ALKPHOS 66 01/16/2013 0829   BILITOT 0.8 06/10/2013 1631   BILITOT 1.3* 01/16/2013 0829   PROT 7.2 06/10/2013 1631   PROT 7.2 01/16/2013 0829   ALBUMIN 4.4 06/10/2013 1631   ALBUMIN 4.1 01/16/2013 0829     Studies/Results: No results found.  Anti-infectives: Anti-infectives   Start     Dose/Rate Route Frequency Ordered Stop   10/25/13 0715  clindamycin (CLEOCIN) IVPB 900 mg     900 mg 100 mL/hr over 30 Minutes Intravenous On call to O.R. 10/25/13 0707 10/25/13 0744      Assessment Principal Problem:   Parastomal hernia s/p robotic repair with Strattice mesh 10/25/13-stable overnight    LOS: 1 day   Plan: OOB.  Keep on clear liquids.   Kendi Defalco Lenna Sciara 10/26/2013

## 2013-10-27 DIAGNOSIS — Z6831 Body mass index (BMI) 31.0-31.9, adult: Secondary | ICD-10-CM | POA: Diagnosis not present

## 2013-10-27 DIAGNOSIS — Z87891 Personal history of nicotine dependence: Secondary | ICD-10-CM | POA: Diagnosis not present

## 2013-10-27 DIAGNOSIS — Z23 Encounter for immunization: Secondary | ICD-10-CM | POA: Diagnosis not present

## 2013-10-27 DIAGNOSIS — Z8249 Family history of ischemic heart disease and other diseases of the circulatory system: Secondary | ICD-10-CM | POA: Diagnosis not present

## 2013-10-27 DIAGNOSIS — Z881 Allergy status to other antibiotic agents status: Secondary | ICD-10-CM | POA: Diagnosis not present

## 2013-10-27 DIAGNOSIS — IMO0002 Reserved for concepts with insufficient information to code with codable children: Secondary | ICD-10-CM | POA: Diagnosis present

## 2013-10-27 DIAGNOSIS — C20 Malignant neoplasm of rectum: Secondary | ICD-10-CM | POA: Diagnosis present

## 2013-10-27 DIAGNOSIS — Z79899 Other long term (current) drug therapy: Secondary | ICD-10-CM | POA: Diagnosis not present

## 2013-10-27 DIAGNOSIS — Z823 Family history of stroke: Secondary | ICD-10-CM | POA: Diagnosis not present

## 2013-10-27 DIAGNOSIS — K560 Paralytic ileus: Secondary | ICD-10-CM | POA: Diagnosis not present

## 2013-10-27 DIAGNOSIS — I2510 Atherosclerotic heart disease of native coronary artery without angina pectoris: Secondary | ICD-10-CM | POA: Diagnosis present

## 2013-10-27 DIAGNOSIS — Z951 Presence of aortocoronary bypass graft: Secondary | ICD-10-CM | POA: Diagnosis not present

## 2013-10-27 DIAGNOSIS — I252 Old myocardial infarction: Secondary | ICD-10-CM | POA: Diagnosis not present

## 2013-10-27 DIAGNOSIS — E669 Obesity, unspecified: Secondary | ICD-10-CM | POA: Diagnosis present

## 2013-10-27 DIAGNOSIS — E7800 Pure hypercholesterolemia, unspecified: Secondary | ICD-10-CM | POA: Diagnosis present

## 2013-10-27 MED ORDER — CETYLPYRIDINIUM CHLORIDE 0.05 % MT LIQD
7.0000 mL | Freq: Two times a day (BID) | OROMUCOSAL | Status: DC
  Administered 2013-10-28 – 2013-10-29 (×2): 7 mL via OROMUCOSAL

## 2013-10-27 NOTE — Progress Notes (Signed)
2 Days Post-Op  Subjective: Feels more bloated today and uncomfortable because of that.  Objective: Vital signs in last 24 hours: Temp:  [97.7 F (36.5 C)-98.7 F (37.1 C)] 98.3 F (36.8 C) (09/27 0600) Pulse Rate:  [64-77] 77 (09/27 0600) Resp:  [16] 16 (09/27 0600) BP: (112-147)/(62-78) 132/74 mmHg (09/27 0600) SpO2:  [90 %-96 %] 91 % (09/27 0600) Last BM Date: 10/24/13  Intake/Output from previous day: 09/26 0701 - 09/27 0700 In: 3123.8 [P.O.:1320; I.V.:1803.8] Out: 1800 [Urine:1800] Intake/Output this shift:    PE: General- In NAD Abdomen-more firm and distended today, no bowel sounds, incisions clean and intact, colostomy pink with no output  Lab Results:   Recent Labs  10/24/13 1415 10/26/13 0522  WBC 7.6 14.6*  HGB 15.5 13.9  HCT 44.4 40.4  PLT 144* 142*   BMET  Recent Labs  10/24/13 1415 10/26/13 0522  NA 141 139  K 4.6 4.6  CL 101 101  CO2 26 26  GLUCOSE 92 130*  BUN 18 15  CREATININE 0.77 0.72  CALCIUM 9.1 8.8   PT/INR No results found for this basename: LABPROT, INR,  in the last 72 hours Comprehensive Metabolic Panel:    Component Value Date/Time   NA 139 10/26/2013 0522   NA 141 10/24/2013 1415   K 4.6 10/26/2013 0522   K 4.6 10/24/2013 1415   CL 101 10/26/2013 0522   CL 101 10/24/2013 1415   CO2 26 10/26/2013 0522   CO2 26 10/24/2013 1415   BUN 15 10/26/2013 0522   BUN 18 10/24/2013 1415   CREATININE 0.72 10/26/2013 0522   CREATININE 0.77 10/24/2013 1415   CREATININE 0.76 04/24/2012 1638   GLUCOSE 130* 10/26/2013 0522   GLUCOSE 92 10/24/2013 1415   CALCIUM 8.8 10/26/2013 0522   CALCIUM 9.1 10/24/2013 1415   AST 32 06/10/2013 1631   AST 63* 01/16/2013 0829   ALT 35 06/10/2013 1631   ALT 57* 01/16/2013 0829   ALKPHOS 56 06/10/2013 1631   ALKPHOS 66 01/16/2013 0829   BILITOT 0.8 06/10/2013 1631   BILITOT 1.3* 01/16/2013 0829   PROT 7.2 06/10/2013 1631   PROT 7.2 01/16/2013 0829   ALBUMIN 4.4 06/10/2013 1631   ALBUMIN 4.1 01/16/2013 0829      Studies/Results: No results found.  Anti-infectives: Anti-infectives   Start     Dose/Rate Route Frequency Ordered Stop   10/25/13 0715  clindamycin (CLEOCIN) IVPB 900 mg     900 mg 100 mL/hr over 30 Minutes Intravenous On call to O.R. 10/25/13 0707 10/25/13 0744      Assessment Principal Problem:   Parastomal hernia s/p robotic repair with Strattice mesh 10/25/13-has a postop ileus.    LOS: 2 days   Plan: Decrease diet.  Wait for ileus to resolve.   Sandor Arboleda Lenna Sciara 10/27/2013

## 2013-10-28 ENCOUNTER — Encounter (HOSPITAL_COMMUNITY): Payer: Self-pay | Admitting: General Surgery

## 2013-10-28 NOTE — Progress Notes (Signed)
3 Days Post-Op  Subjective: Feels less bloated today.  Ambulating well.    Objective: Vital signs in last 24 hours: Temp:  [98.2 F (36.8 C)-98.5 F (36.9 C)] 98.5 F (36.9 C) (09/28 0510) Pulse Rate:  [73-81] 74 (09/28 0510) Resp:  [16-18] 16 (09/28 0510) BP: (102-110)/(63-74) 102/67 mmHg (09/28 0510) SpO2:  [95 %-96 %] 96 % (09/28 0510) Last BM Date: 10/24/13  Intake/Output from previous day: 09/27 0701 - 09/28 0700 In: 1796.3 [I.V.:1796.3] Out: 1050 [Urine:1050] Intake/Output this shift: Total I/O In: -  Out: 250 [Urine:250]  PE: General- In NAD Abdomen- distended today, incisions clean and intact, colostomy beefy red with no output or air  Lab Results:   Recent Labs  10/26/13 0522  WBC 14.6*  HGB 13.9  HCT 40.4  PLT 142*   BMET  Recent Labs  10/26/13 0522  NA 139  K 4.6  CL 101  CO2 26  GLUCOSE 130*  BUN 15  CREATININE 0.72  CALCIUM 8.8   PT/INR No results found for this basename: LABPROT, INR,  in the last 72 hours Comprehensive Metabolic Panel:    Component Value Date/Time   NA 139 10/26/2013 0522   NA 141 10/24/2013 1415   K 4.6 10/26/2013 0522   K 4.6 10/24/2013 1415   CL 101 10/26/2013 0522   CL 101 10/24/2013 1415   CO2 26 10/26/2013 0522   CO2 26 10/24/2013 1415   BUN 15 10/26/2013 0522   BUN 18 10/24/2013 1415   CREATININE 0.72 10/26/2013 0522   CREATININE 0.77 10/24/2013 1415   CREATININE 0.76 04/24/2012 1638   GLUCOSE 130* 10/26/2013 0522   GLUCOSE 92 10/24/2013 1415   CALCIUM 8.8 10/26/2013 0522   CALCIUM 9.1 10/24/2013 1415   AST 32 06/10/2013 1631   AST 63* 01/16/2013 0829   ALT 35 06/10/2013 1631   ALT 57* 01/16/2013 0829   ALKPHOS 56 06/10/2013 1631   ALKPHOS 66 01/16/2013 0829   BILITOT 0.8 06/10/2013 1631   BILITOT 1.3* 01/16/2013 0829   PROT 7.2 06/10/2013 1631   PROT 7.2 01/16/2013 0829   ALBUMIN 4.4 06/10/2013 1631   ALBUMIN 4.1 01/16/2013 0829     Studies/Results: No results found.  Anti-infectives: Anti-infectives   Start     Dose/Rate Route Frequency Ordered Stop   10/25/13 0715  clindamycin (CLEOCIN) IVPB 900 mg     900 mg 100 mL/hr over 30 Minutes Intravenous On call to O.R. 10/25/13 0707 10/25/13 0744      Assessment Principal Problem:   Parastomal hernia s/p robotic repair with Strattice mesh 10/25/13-has a postop ileus.    LOS: 3 days   Plan: Cont NPO.  Wait for ileus to resolve.  Will restart Miralax once ileus resolves.     Cody Coleman C. 08/03/5007

## 2013-10-29 MED ORDER — POLYETHYLENE GLYCOL 3350 17 G PO PACK
17.0000 g | PACK | Freq: Two times a day (BID) | ORAL | Status: DC
Start: 2013-10-29 — End: 2013-10-30
  Administered 2013-10-29 (×2): 17 g via ORAL
  Filled 2013-10-29 (×4): qty 1

## 2013-10-29 NOTE — Progress Notes (Signed)
4 Days Post-Op  Subjective: Feels better.  Min narcotics use.  No flatus.  No nausea.  Ambulating well.    Objective: Vital signs in last 24 hours: Temp:  [97.7 F (36.5 C)-98.8 F (37.1 C)] 98.8 F (37.1 C) (09/29 0612) Pulse Rate:  [77-81] 80 (09/29 0612) Resp:  [16] 16 (09/29 0612) BP: (106-113)/(55-61) 106/55 mmHg (09/29 0612) SpO2:  [96 %-98 %] 97 % (09/29 0612) Last BM Date: 10/24/13  Intake/Output from previous day: 09/28 0701 - 09/29 0700 In: 1800 [I.V.:1800] Out: 1725 [Urine:1725] Intake/Output this shift: Total I/O In: -  Out: 300 [Urine:300]  PE: General- In NAD Abdomen- less distended today, incisions clean and intact, colostomy pink with no output or air  Lab Results:  No results found for this basename: WBC, HGB, HCT, PLT,  in the last 72 hours BMET No results found for this basename: NA, K, CL, CO2, GLUCOSE, BUN, CREATININE, CALCIUM,  in the last 72 hours PT/INR No results found for this basename: LABPROT, INR,  in the last 72 hours Comprehensive Metabolic Panel:    Component Value Date/Time   NA 139 10/26/2013 0522   NA 141 10/24/2013 1415   K 4.6 10/26/2013 0522   K 4.6 10/24/2013 1415   CL 101 10/26/2013 0522   CL 101 10/24/2013 1415   CO2 26 10/26/2013 0522   CO2 26 10/24/2013 1415   BUN 15 10/26/2013 0522   BUN 18 10/24/2013 1415   CREATININE 0.72 10/26/2013 0522   CREATININE 0.77 10/24/2013 1415   CREATININE 0.76 04/24/2012 1638   GLUCOSE 130* 10/26/2013 0522   GLUCOSE 92 10/24/2013 1415   CALCIUM 8.8 10/26/2013 0522   CALCIUM 9.1 10/24/2013 1415   AST 32 06/10/2013 1631   AST 63* 01/16/2013 0829   ALT 35 06/10/2013 1631   ALT 57* 01/16/2013 0829   ALKPHOS 56 06/10/2013 1631   ALKPHOS 66 01/16/2013 0829   BILITOT 0.8 06/10/2013 1631   BILITOT 1.3* 01/16/2013 0829   PROT 7.2 06/10/2013 1631   PROT 7.2 01/16/2013 0829   ALBUMIN 4.4 06/10/2013 1631   ALBUMIN 4.1 01/16/2013 0829     Studies/Results: No results  found.  Anti-infectives: Anti-infectives   Start     Dose/Rate Route Frequency Ordered Stop   10/25/13 0715  clindamycin (CLEOCIN) IVPB 900 mg     900 mg 100 mL/hr over 30 Minutes Intravenous On call to O.R. 10/25/13 0707 10/25/13 0744      Assessment Principal Problem:   Parastomal hernia s/p robotic repair with Strattice mesh 10/25/13-has a postop ileus.    LOS: 4 days   Plan: feels much better. Will try full liquids today.  Will restart Miralax.     Cody Coleman C. 9/89/2119

## 2013-10-30 MED ORDER — OXYCODONE-ACETAMINOPHEN 5-325 MG PO TABS: 1.0000 | ORAL_TABLET | ORAL | Status: DC | PRN

## 2013-10-30 MED ORDER — POLYETHYLENE GLYCOL 3350 17 G PO PACK: 17.0000 g | PACK | Freq: Every day | ORAL | Status: DC | PRN

## 2013-10-30 NOTE — Discharge Summary (Signed)
Physician Discharge Summary  Patient ID: Cody Coleman MRN: [id number] DOB/AGE: 10/16/1945 68 y.o.  Admit date: 10/25/2013 Discharge date: 10/30/2013  Admission Diagnoses:  Discharge Diagnoses:  Active Problems:   * No active hospital problems. *   Discharged Condition: good  Hospital Course: Patient was admitted after surgery.  He developed a post op ileus.  This resolved by POD 4.  His diet was advanced on POD 5 and he began to have bowel function after that.    Consults: None  Significant Diagnostic Studies: labs: cbc, chemistry  Treatments: IV hydration, analgesia: acetaminophen w/ codeine and Morphine and surgery: robotic assisted parastomal hernia repair (sugarbaker) with Strattice mesh.  Discharge Exam: Blood pressure 122/69, pulse 85, temperature 98.2 F (36.8 C), temperature source Oral, resp. rate 14, height 5\' 7"  (1.702 m), weight 200 lb (90.719 kg), SpO2 95.00%. General appearance: alert and cooperative GI: normal findings: soft, non-tender Incision/Wound: clean, dry, intact.    Disposition: 01-Home or Self Care     Medication List    ASK your doctor about these medications       ALPRAZolam 1 MG tablet  Commonly known as:  XANAX  Take 1 mg by mouth 4 (four) times daily as needed for anxiety or sleep.     aspirin 325 MG tablet  Take 325 mg by mouth every morning.     atorvastatin 40 MG tablet  Commonly known as:  LIPITOR  Take 40 mg by mouth every morning.     docusate sodium 100 MG capsule  Commonly known as:  COLACE  Take 100 mg by mouth as needed for constipation.     tamsulosin 0.4 MG Caps capsule  Commonly known as:  FLOMAX  Take 0.4 mg by mouth daily after breakfast.           Follow-up Information   Follow up with Rosario Adie., MD. Schedule an appointment as soon as possible for a visit in 2 weeks.   Specialty:  General Surgery   Contact information:   [address] [place] [address] [phone]        Signed: Rosario Adie 3/97/6734, 1:93 AM

## 2013-10-30 NOTE — Progress Notes (Signed)
Discharge instructions provided including prescriptions, follow up care, diet, and pain management. Patient and spouse verbalize understanding.

## 2013-10-30 NOTE — Discharge Instructions (Signed)

## 2013-10-30 NOTE — Care Management Note (Addendum)
    Page 1 of 1   10/30/2013     10:06:41 AM CARE MANAGEMENT NOTE 10/30/2013  Patient:  Cody Coleman, Cody Coleman   Account Number:  [id number]  Date Initiated:  10/30/2013  Documentation initiated by:  Sunday Spillers  Subjective/Objective Assessment:   68 yo male admitted s/p parastomal hernia repair.     Action/Plan:   Home when stable   Anticipated DC Date:  10/30/2013   Anticipated DC Plan:  [place]  CM consult      Choice offered to / List presented to:             Status of service:  Completed, signed off Medicare Important Message given?  YES (If response is "NO", the following Medicare IM given date fields will be blank) Date Medicare IM given:  10/30/2013 Medicare IM given by:  [facility] Date Additional Medicare IM given:   Additional Medicare IM given by:    Discharge Disposition:  HOME/SELF CARE  Per UR Regulation:  Reviewed for med. necessity/level of care/duration of stay  If discussed at [place] of Stay Meetings, dates discussed:    Comments:

## 2013-11-13 ENCOUNTER — Other Ambulatory Visit (INDEPENDENT_AMBULATORY_CARE_PROVIDER_SITE_OTHER): Payer: Self-pay

## 2013-11-13 DIAGNOSIS — IMO0002 Reserved for concepts with insufficient information to code with codable children: Secondary | ICD-10-CM

## 2013-11-14 ENCOUNTER — Other Ambulatory Visit: Payer: Self-pay | Admitting: Radiology

## 2013-11-15 ENCOUNTER — Other Ambulatory Visit: Payer: Self-pay

## 2013-11-15 ENCOUNTER — Ambulatory Visit (HOSPITAL_COMMUNITY)
Admission: RE | Admit: 2013-11-15 | Discharge: 2013-11-15 | Disposition: A | Discharge status: Home / Self Care | Payer: Medicare Other | Source: Ambulatory Visit | Attending: General Surgery | Admitting: General Surgery

## 2013-11-15 ENCOUNTER — Encounter (HOSPITAL_COMMUNITY): Payer: Self-pay

## 2013-11-15 DIAGNOSIS — E780 Pure hypercholesterolemia: Secondary | ICD-10-CM | POA: Diagnosis not present

## 2013-11-15 DIAGNOSIS — R350 Frequency of micturition: Secondary | ICD-10-CM | POA: Insufficient documentation

## 2013-11-15 DIAGNOSIS — Z933 Colostomy status: Secondary | ICD-10-CM | POA: Diagnosis not present

## 2013-11-15 DIAGNOSIS — Z87891 Personal history of nicotine dependence: Secondary | ICD-10-CM | POA: Insufficient documentation

## 2013-11-15 DIAGNOSIS — C20 Malignant neoplasm of rectum: Secondary | ICD-10-CM | POA: Insufficient documentation

## 2013-11-15 DIAGNOSIS — I252 Old myocardial infarction: Secondary | ICD-10-CM | POA: Insufficient documentation

## 2013-11-15 DIAGNOSIS — N401 Enlarged prostate with lower urinary tract symptoms: Secondary | ICD-10-CM | POA: Diagnosis not present

## 2013-11-15 DIAGNOSIS — IMO0002 Reserved for concepts with insufficient information to code with codable children: Secondary | ICD-10-CM

## 2013-11-15 DIAGNOSIS — T792XXD Traumatic secondary and recurrent hemorrhage and seroma, subsequent encounter: Secondary | ICD-10-CM | POA: Diagnosis not present

## 2013-11-15 DIAGNOSIS — R1903 Right lower quadrant abdominal swelling, mass and lump: Secondary | ICD-10-CM | POA: Diagnosis not present

## 2013-11-15 LAB — CBC WITH DIFFERENTIAL/PLATELET
BASOS PCT: 0 % (ref 0–1)
Basophils Absolute: 0 10*3/uL (ref 0.0–0.1)
Eosinophils Absolute: 0.7 10*3/uL (ref 0.0–0.7)
Eosinophils Relative: 10 % — ABNORMAL HIGH (ref 0–5)
HCT: 39.8 % (ref 39.0–52.0)
HEMOGLOBIN: 13.7 g/dL (ref 13.0–17.0)
LYMPHS ABS: 1.5 10*3/uL (ref 0.7–4.0)
Lymphocytes Relative: 21 % (ref 12–46)
MCH: 30.5 pg (ref 26.0–34.0)
MCHC: 34.4 g/dL (ref 30.0–36.0)
MCV: 88.6 fL (ref 78.0–100.0)
MONOS PCT: 8 % (ref 3–12)
Monocytes Absolute: 0.6 10*3/uL (ref 0.1–1.0)
NEUTROS PCT: 61 % (ref 43–77)
Neutro Abs: 4.4 10*3/uL (ref 1.7–7.7)
PLATELETS: 258 10*3/uL (ref 150–400)
RBC: 4.49 MIL/uL (ref 4.22–5.81)
RDW: 13.3 % (ref 11.5–15.5)
WBC: 7.2 10*3/uL (ref 4.0–10.5)

## 2013-11-15 LAB — PROTIME-INR
INR: 1.08 (ref 0.00–1.49)
PROTHROMBIN TIME: 14.1 s (ref 11.6–15.2)

## 2013-11-15 MED ORDER — SODIUM CHLORIDE 0.9 % IV SOLN
INTRAVENOUS | Status: DC
  Administered 2013-11-15: 500 mL via INTRAVENOUS

## 2013-11-15 MED ORDER — FENTANYL CITRATE 0.05 MG/ML IJ SOLN
INTRAMUSCULAR | Status: AC
  Filled 2013-11-15: qty 6

## 2013-11-15 MED ORDER — MIDAZOLAM HCL 2 MG/2ML IJ SOLN
INTRAMUSCULAR | Status: AC
  Filled 2013-11-15: qty 6

## 2013-11-15 MED ORDER — HYDROCODONE-ACETAMINOPHEN 5-325 MG PO TABS
1.0000 | ORAL_TABLET | ORAL | Status: DC | PRN
  Filled 2013-11-15: qty 2

## 2013-11-15 NOTE — Procedures (Signed)
US guided 64f pigtail placement into LLQ SQ seroma No complication No blood loss. See complete dictation in [facility].

## 2013-11-15 NOTE — Sedation Documentation (Signed)
Pt required no Moderate Sedation for seroma drain placement.

## 2013-11-15 NOTE — Discharge Instructions (Signed)
Seroma A seroma is a collection of fluid that looks like swelling or a mass on the body. Seromas form on the body where tissue has been injured or cut. They are most common after surgeries. Seromas vary in size. Some are small and painless. Others may become large and cause pain or discomfort. Many seromas go away on their own; the fluid is naturally absorbed by the body. Some may require the fluid to be drained through medical procedures.  CAUSES  Seromas form as the result of damage to tissue or the removal of tissue. This tissue damage may occur during surgery or because of an injury or trauma. When tissue is disrupted or removed, empty space is created. The body's natural defense system causes fluid to enter the empty space and form a seroma. SYMPTOMS   Swelling at the site of a surgical cut (incision) or an injury.  Drainage of clear fluid at the surgery or injury site.  Possible discomfort or pain. DIAGNOSIS  Your health care provider will perform a physical exam. During the exam, the health care provider will press on the seroma using a hand or fingers (palpation). Various tests may be ordered to help confirm the diagnosis. These tests may include:  Blood tests.  Imaging tests such as ultrasonography or computed tomography (CT). TREATMENT  Sometimes seromas resolve on their own and drain naturally in the body. Your health care provider may monitor you to make sure the seroma does not cause any complications. If your seroma does not resolve on its own, treatment may include:  Using a needle to drain the fluid from the seroma (needle aspiration).  Inserting a flexible tube (catheter) to drain the fluid.  Applying a dressing, such as an elastic bandage or binder.  Use of antibiotic medicines if the seroma becomes infected.  In rare cases, surgery may be done to remove the seroma and repair the area. HOME CARE INSTRUCTIONS  Follow your health care provider's instructions regarding  activity levels and any limitations on movements.  Only take over-the-counter or prescription medicines as directed by your health care provider.  If your health care provider prescribes antibiotics, take them as directed. Finish them even if you start to feel better.  Check your seroma every day for redness, warmth, or yellow drainage.  Follow up with your health care provider as directed. SEEK MEDICAL CARE IF:  You develop a fever.  You have pain, tenderness, redness, or warmth at the site of the seroma.  You notice yellow drainage coming from the site of the seroma.  Your seroma is getting bigger. Document Released: 05/14/2012 Document Revised: 06/03/2013 Document Reviewed: 05/14/2012 [facility] Patient Information 2015 [place], [place]. This information is not intended to replace advice given to you by your health care provider. Make sure you discuss any questions you have with your health care provider. Percutaneous Abscess Drain An abscess is a collection of infected fluid inside the body. Your health care provider may decide to remove or drain the infected fluid from the area by placing a thin needle into the abscess. Usually, a small tube is left in place to drain the abscess fluid. The abscess fluid may take a few days to drain. LET [facility] CARE PROVIDER KNOW ABOUT:  Any allergies you have.  All medicines you are taking, including vitamins, herbs, eye drops, creams, and over-the-counter medicines. This includes steroid medicines by mouth or cream.  Previous problems you or members of your family have had with the use of anesthetics.  Any blood disorders you have.  Previous surgeries you have had.  Possibility of pregnancy, if this applies.  Medical conditions you have.  Any history of smoking. RISKS AND COMPLICATIONS Generally, this is a safe procedure. However, problems can occur and include:   Infection.  Allergic reaction to materials used (such as contrast  dye).  Damage to a nearby organ or tissue.  Bleeding.  Blockage of a tube placed to drain the abscess, requiring placement of a new drainage tube.  A need to repeat the procedure.  Failure of the procedure to adequately drain the abscess, requiring an open surgical procedure to do so. BEFORE THE PROCEDURE   Ask your health care provider about:  Changing or stopping your regular medicines. This is especially important if you are taking diabetes medicines or blood thinners.  Taking medicines such as aspirin and ibuprofen. These medicines can thin your blood. Do not take these medicines before your procedure if your health care provider asks you not to.  Your health care provider may do some blood or urine tests. These will help your health care provider learn how well your kidneys and liver are working and how well your blood clots.  Do not eat or drink anything after midnight on the night before the procedure or as directed by your health care provider.  Make arrangements for someone to drive you home after the procedure.  PROCEDURE   An IV tube will be placed in your arm. Medicine will be able to flow directly into your body through this tube.  You will lie on an X-ray table.  Your heart rate, blood pressure, and breathing will be monitored.   Your oxygen level will also be watched during the procedure. Supplemental oxygen may be given if necessary.  The skin around the area where the drainage tube (catheter) will be placed will be cleaned and numbed.  A small cut (incision) will then be made to insert the drainage tube. The drainage tube will be inserted using X-ray or CT scan to help direct where it should be placed.  The drainage tube will be guided into the abscess to drain the infected fluid.  The drainage tube may stay in place and be connected to a bag outside your body. It will stay until the fluid has stopped draining and the infection is gone. AFTER THE  PROCEDURE  You will be taken to a recovery area where you will stay until the medicines have worn off.  You will stay in bed for several hours.  Your progress will be monitored.   Your blood pressure and pulse will be checked often.   The area of the incision will be checked often.  You may have some pain or feel sick. Tell your health care provider.  As you begin to feel better, you may be given ice, fluids, and food.   When you can walk, drink, eat, and use the bathroom, you may be able to go home. Document Released: 06/03/2013 Document Reviewed: 03/08/2013 [facility] Patient Information 2015 [place]. This information is not intended to replace advice given to you by your health care provider. Make sure you discuss any questions you have with your health care provider. No bathing / apply plastic wrap to site prior to shower and replace with dry 4x4 gauze Measure and record drainage as needed Contact radiology for any increase in pain/redness drainage around site questions or concerns. Dr Vernard Gambles [phone]

## 2013-11-15 NOTE — H&P (Signed)
Chief Complaint: "I am here to get a drain placed in my lower abdominal area."  Referring Physician(s): Thomas,Alicia  History of Present Illness: Cody Coleman is a 68 y.o. male with history of rectal cancer and parastomal hernia s/p repair with strattice mesh on 10/25/13. Patient c/o right suprapubic area swelling progressively increasing. IR received request for image guided seroma drain placement. He denies any chest pain, shortness of breath or palpitations. He denies any active signs of bleeding or excessive bruising. He denies any recent fever or chills. The patient denies any history of sleep apnea or chronic oxygen use. He has previously tolerated sedation without complications, however may want to have only local anesthetic for today's procedure.   Past Medical History  Diagnosis Date  . CAD (coronary artery disease)   . High cholesterol   . Urgency-frequency syndrome     FLOMAX HAS HELPED  . MI (myocardial infarction)     x 2  AT AGE 18 AND AT AGE 62  DR. [facility] IS PT'S MEDICAL DOCTOR  . Cancer     RECTAL CANCER--SOME RECTAL BLEEDING  . Pain     RIGHT KNEE PAIN AND OCCAS OTHER JOINT PAINS  . Sleep difficulties     NEVER SLEEPS WELL - WAKES UP AFTER SEVERAL HOURS AND NOT ABLE TO GO BACK TO SLEEP--DID SLEEP STUDY AND TOLD HE DID NOT HAVE SLEEP APNEA  . Former tobacco use   . BPH (benign prostatic hyperplasia)     Past Surgical History  Procedure Laterality Date  . Coronary artery bypass graft      2005  . Tonsillectomy    . Coronary angioplasty with stent placement      before bypass  . Colonoscopy N/A 04/20/2012    Procedure: COLONOSCOPY;  Surgeon: Rogene Houston, MD;  [facility];  Service: Endoscopy;  Laterality: N/A;  225  . Eus N/A 05/03/2012    Procedure: LOWER ENDOSCOPIC ULTRASOUND (EUS);  Surgeon: Milus Banister, MD;  Location: [facility];  Service: Endoscopy;  Laterality: N/A;  . Laparoscopic assisted abdominal perineal resection N/A  06/11/2012    Procedure: LAPAROSCOPIC ASSISTED ABDOMINAL PERINEAL RESECTION;  Surgeon: Leighton Ruff, MD;  Location: [facility];  Service: General;  Laterality: N/A;  . Colostomy revision N/A 01/17/2013    Procedure: COLOSTOMY REVISION;  Surgeon: Leighton Ruff, MD;  Location: [place];  Service: General;  Laterality: N/A;  . Colonoscopy N/A 06/14/2013    Procedure: COLONOSCOPY;  Surgeon: Rogene Houston, MD;  Location: [facility];  Service: Endoscopy;  Laterality: N/A;  1245  . Laparoscopic parastomal hernia N/A 10/25/2013    Procedure: ROBOTIC PARASTOMAL HERNIA REPAIR WITH STRATTICE MESH;  Surgeon: Leighton Ruff, MD;  Location: [facility];  Service: General;  Laterality: N/A;  . Insertion of mesh N/A 10/25/2013    Procedure: INSERTION OF STRATTICE MESH;  Surgeon: Leighton Ruff, MD;  Location: [facility];  Service: General;  Laterality: N/A;    Allergies: Augmentin  Medications: Prior to Admission medications   Medication Sig Start Date End Date Taking? Authorizing Provider  ALPRAZolam Duanne Moron) 1 MG tablet Take 1 mg by mouth 4 (four) times daily as needed for anxiety or sleep.    Yes Historical Provider, MD  aspirin 325 MG tablet Take 325 mg by mouth every morning.   Yes Historical Provider, MD  atorvastatin (LIPITOR) 40 MG tablet Take 40 mg by mouth every morning.   Yes Historical Provider, MD  docusate sodium (COLACE) 100 MG capsule  Take 100 mg by mouth as needed for constipation.   Yes Historical Provider, MD  oxyCODONE-acetaminophen (PERCOCET/ROXICET) 5-325 MG per tablet Take 1-2 tablets by mouth every 4 (four) hours as needed for moderate pain. 6/31/49  Yes Leighton Ruff, MD  polyethylene glycol [facility] / GLYCOLAX) packet Take 17 g by mouth daily as needed. 08/01/61  Yes Leighton Ruff, MD  tamsulosin (FLOMAX) 0.4 MG CAPS Take 0.4 mg by mouth daily after breakfast.    Yes Historical Provider, MD    Family History  Problem Relation Age of Onset  . Colon cancer Neg Hx   . Congestive Heart Failure  Mother   . Stroke Father   . Coronary artery disease Brother   . Coronary artery disease Father     History   Social History  . Marital Status: Married    Spouse Name: N/A    Number of Children: N/A  . Years of Education: N/A   Social History Main Topics  . Smoking status: Former Smoker -- 1.00 packs/day for 30 years    Types: Cigarettes    Quit date: 01/31/2002  . Smokeless tobacco: Never Used  . Alcohol Use: Yes     Comment: drinks 2-3 drinks on occasion  . Drug Use: No  . Sexual Activity: No   Other Topics Concern  . None   Social History Narrative   Lives with wife.  Drives, no assist devices.    Review of Systems: A 12 point ROS discussed and pertinent positives are indicated in the HPI above.  All other systems are negative.  Review of Systems  Vital Signs: BP 130/71  Pulse 73  Temp(Src) 98.4 F (36.9 C) (Oral)  Resp 16  SpO2 96%  Physical Exam  Constitutional: He is oriented to person, place, and time. No distress.  HENT:  Head: Normocephalic and atraumatic.  Neck: No tracheal deviation present.  Cardiovascular: Normal rate and regular rhythm.  Exam reveals no gallop and no friction rub.   No murmur heard. Pulmonary/Chest: Effort normal and breath sounds normal. No respiratory distress. He has no wheezes. He has no rales.  Abdominal: Soft. Bowel sounds are normal.  Ostomy intact with brown stool. Right suprapubic distention.  Neurological: He is alert and oriented to person, place, and time.  Skin: Skin is warm and dry. He is not diaphoretic.  Psychiatric: He has a normal mood and affect. His behavior is normal. Thought content normal.    Imaging: No results found.  Labs:  CBC:  Recent Labs  01/23/13 0546 10/24/13 1415 10/26/13 0522 11/15/13 1120  WBC 3.7* 7.6 14.6* 7.2  HGB 12.0* 15.5 13.9 13.7  HCT 35.5* 44.4 40.4 39.8  PLT 159 144* 142* 258    COAGS:  Recent Labs  11/15/13 1120  INR 1.08    BMP:  Recent Labs   01/22/13 0532 01/23/13 0546 06/10/13 0739 10/24/13 1415 10/26/13 0522  NA 135 139  --  141 139  K 4.3 3.8  --  4.6 4.6  CL 99 101  --  101 101  CO2 27 26  --  26 26  GLUCOSE 132* 115*  --  92 130*  BUN 11 6 22 18 15   CALCIUM 8.2* 8.4  --  9.1 8.8  CREATININE 0.80 0.68 0.78 0.77 0.72  GFRNONAA >90 >90 >90 >90 >90  GFRAA >90 >90 >90 >90 >90    LIVER FUNCTION TESTS:  Recent Labs  01/16/13 0829 06/10/13 1631  BILITOT 1.3* 0.8  AST 63* 32  ALT 57* 35  ALKPHOS 66 56  PROT 7.2 7.2  ALBUMIN 4.1 4.4    TUMOR MARKERS:  Recent Labs  12/04/12 1333 03/12/13 1037 06/11/13 1037 10/26/13 0522  CEA 3.5 2.8 2.8 2.3    Assessment and Plan: History of rectal cancer Parastomal hernia s/p repair with strattice mesh 10/25/13 Request for image guided seroma drain placement with moderate sedation, patient may choose not to be sedated per his request Patient has been NPO, no blood thinners taken, labs reviewed Risks and Benefits discussed with the patient. All of the patient's questions were answered, patient is agreeable to proceed. Consent signed and in chart.      SignedHedy Jacob 11/15/2013, 12:58 PM

## 2013-12-02 ENCOUNTER — Encounter (INDEPENDENT_AMBULATORY_CARE_PROVIDER_SITE_OTHER): Payer: Self-pay | Admitting: *Deleted

## 2014-03-24 ENCOUNTER — Other Ambulatory Visit: Payer: Self-pay | Admitting: General Surgery

## 2014-03-24 DIAGNOSIS — C20 Malignant neoplasm of rectum: Secondary | ICD-10-CM

## 2014-03-28 ENCOUNTER — Other Ambulatory Visit: Payer: Self-pay | Admitting: General Surgery

## 2014-03-28 DIAGNOSIS — C20 Malignant neoplasm of rectum: Secondary | ICD-10-CM

## 2014-04-01 ENCOUNTER — Other Ambulatory Visit (INDEPENDENT_AMBULATORY_CARE_PROVIDER_SITE_OTHER): Payer: Self-pay | Admitting: *Deleted

## 2014-04-02 ENCOUNTER — Other Ambulatory Visit (INDEPENDENT_AMBULATORY_CARE_PROVIDER_SITE_OTHER): Payer: Self-pay | Admitting: *Deleted

## 2014-04-03 IMAGING — CT CT ABD-PELV W/ CM
2 of 5 series · 16 of 46 positions shown, 18 images · IV contrast (READICAT/WATER & [ID] OMNI 300)
Comparison: 05/15/2012 PET-CT.  04/26/2012 CT abdomen and pelvis.

CLINICAL DATA: Left lower quadrant discomfort. Colonic resection
for rectal cancer May 2012.

BUN and creatinine were obtained on site at [HOSPITAL] at
[HOSPITAL].
Results:  BUN 15 mg/dL,  Creatinine 0.8 mg/dL.
EXAM:
CT ABDOMEN AND PELVIS WITH CONTRAST
TECHNIQUE: Multidetector CT imaging of the abdomen and pelvis was performed
using the standard protocol following bolus administration of
intravenous contrast.
CONTRAST:  125mL OMNIPAQUE IOHEXOL 300 MG/ML  SOLN

[Series 2: abd/pelvis with · axial · 0.82mm/px · z∈[-460,-34]mm · 13 of 97 slices shown, 15 images]
[im 6/97  soft-tissue]
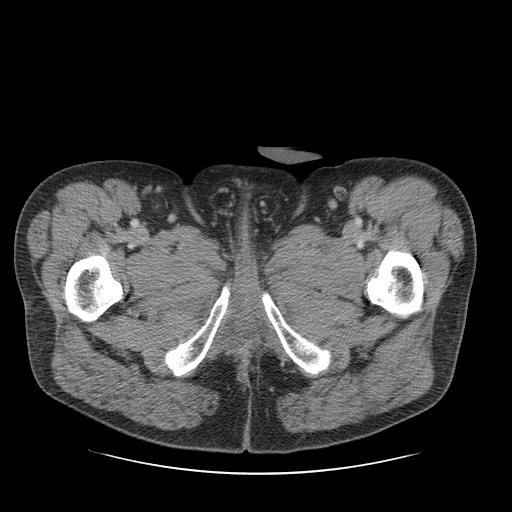
[im 6/97  bone]
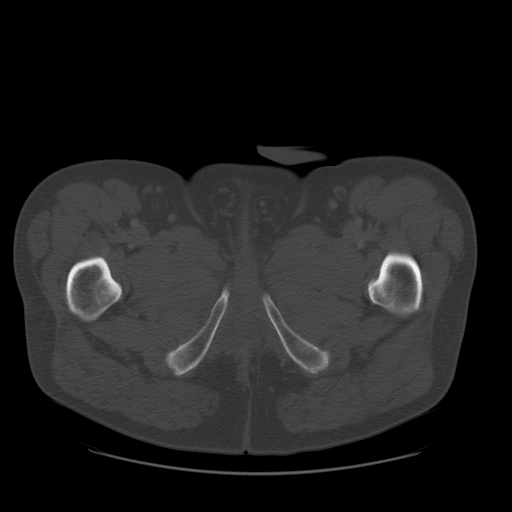
[im 11/97  soft-tissue]
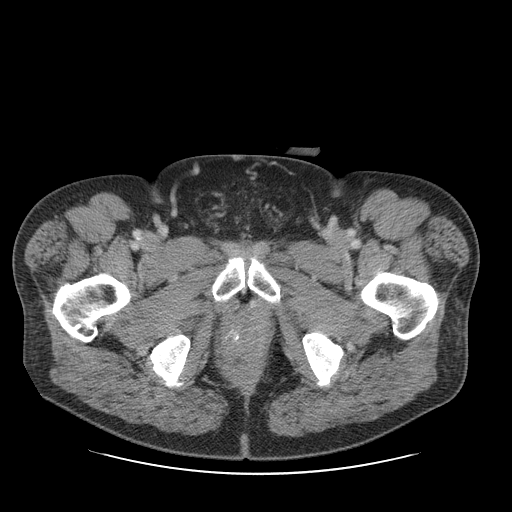
[im 22/97  soft-tissue]
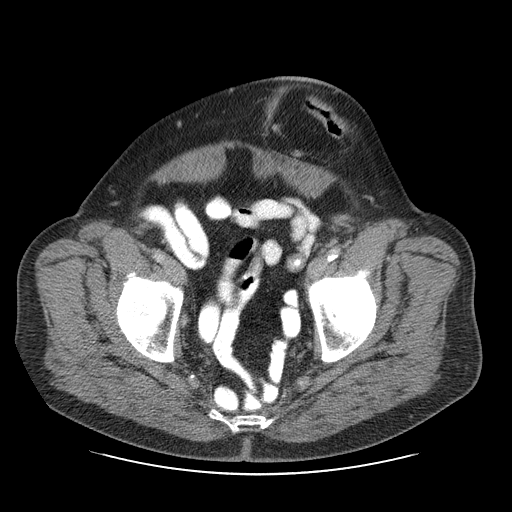
[im 27/97  soft-tissue]
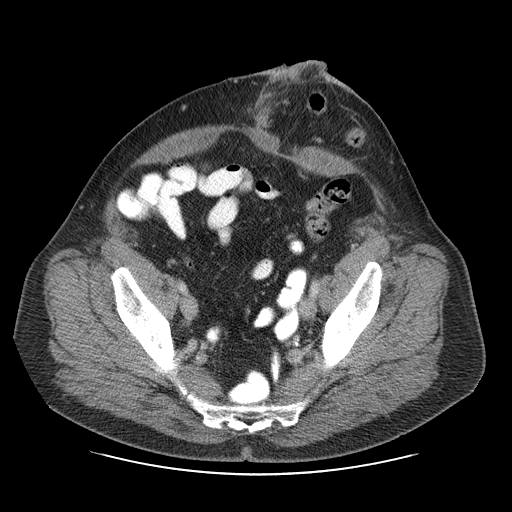
[im 33/97  soft-tissue]
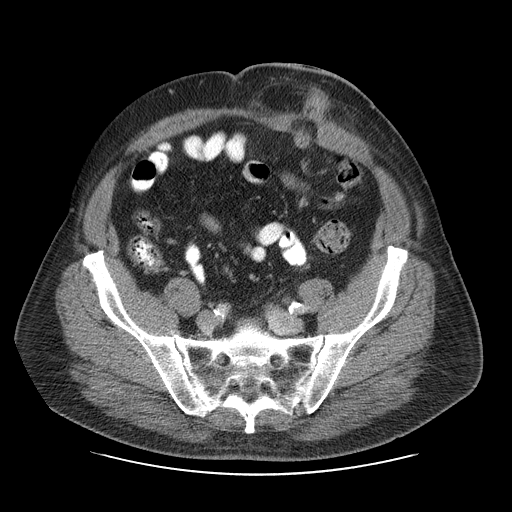
[im 43/97  soft-tissue]
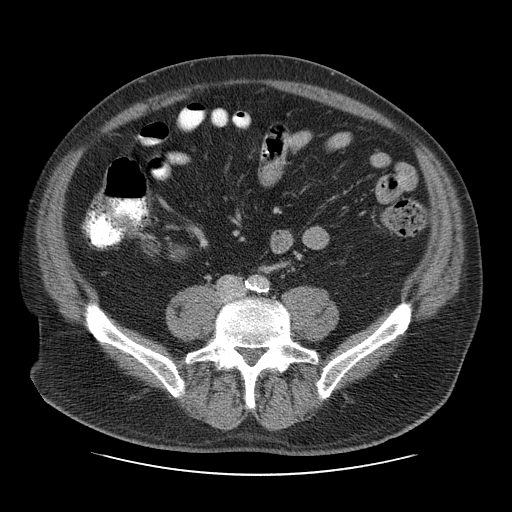
[im 49/97  soft-tissue]
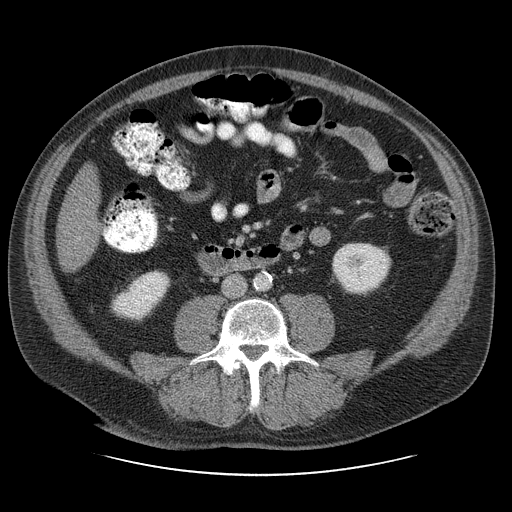
[im 54/97  soft-tissue]
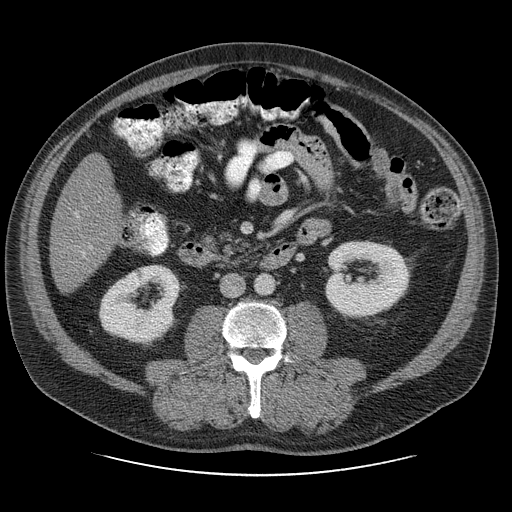
[im 65/97  soft-tissue]
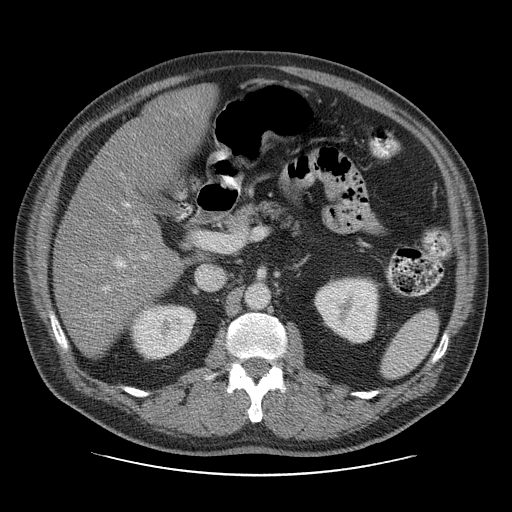
[im 65/97  bone]
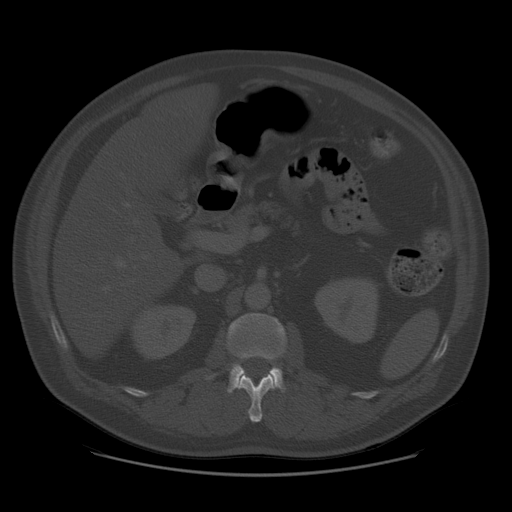
[im 70/97  soft-tissue]
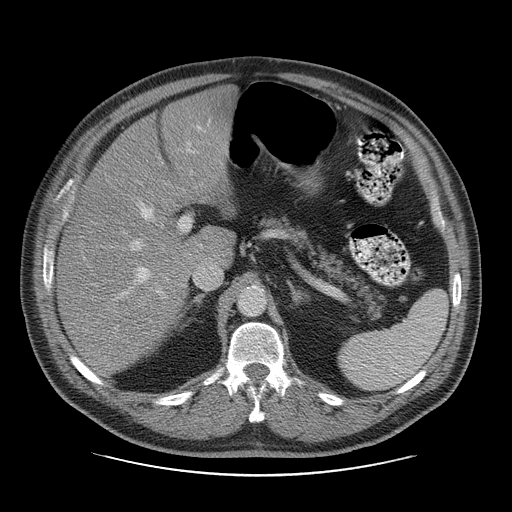
[im 75/97  soft-tissue]
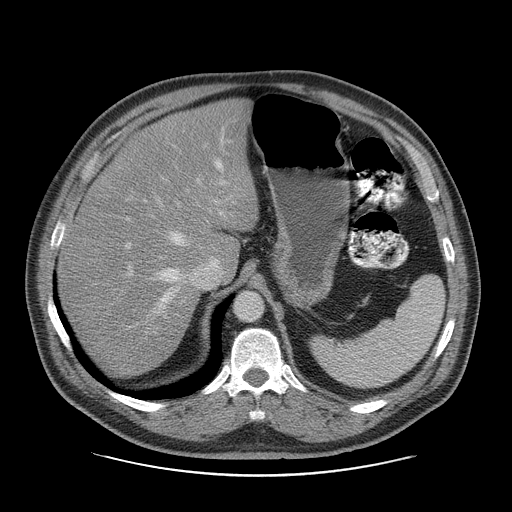
[im 86/97  soft-tissue]
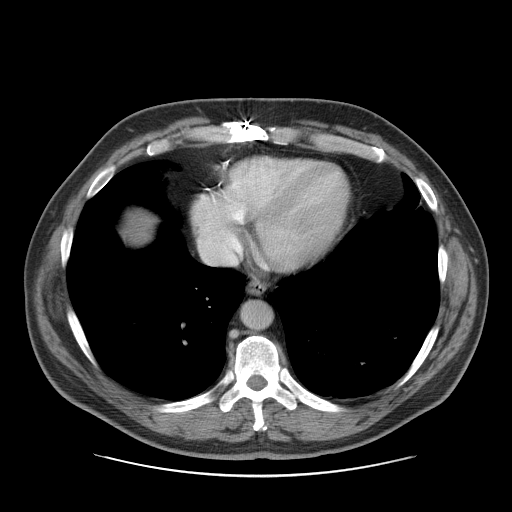
[im 91/97  soft-tissue]
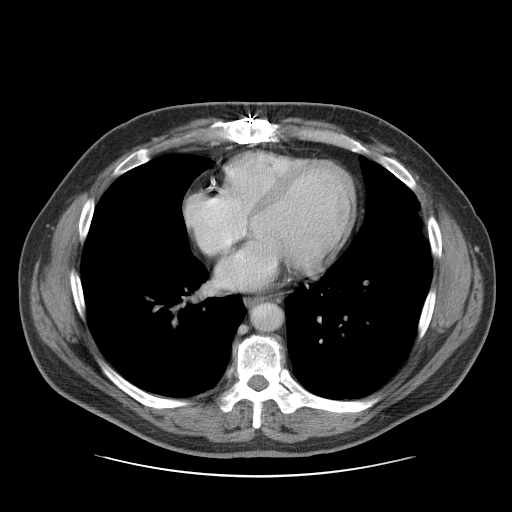

[Series 400: cor · coronal · 0.97mm/px · 3 of 153 slices shown]
[im 51/153  soft-tissue]
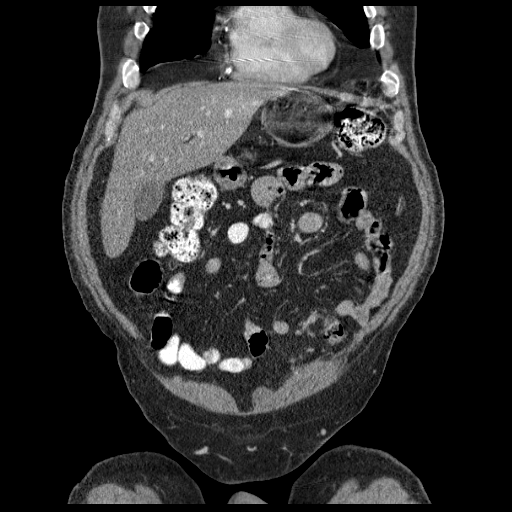
[im 68/153  soft-tissue]
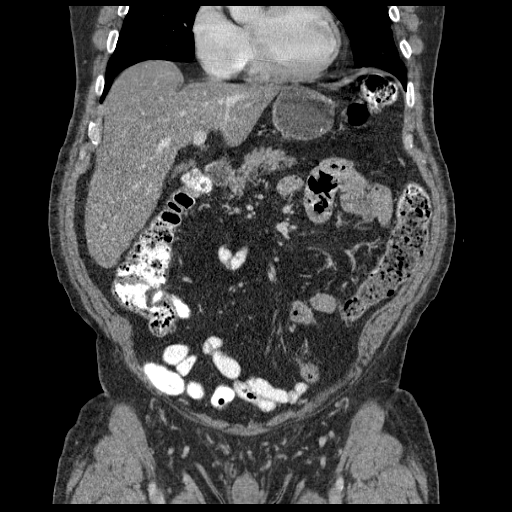
[im 85/153  soft-tissue]
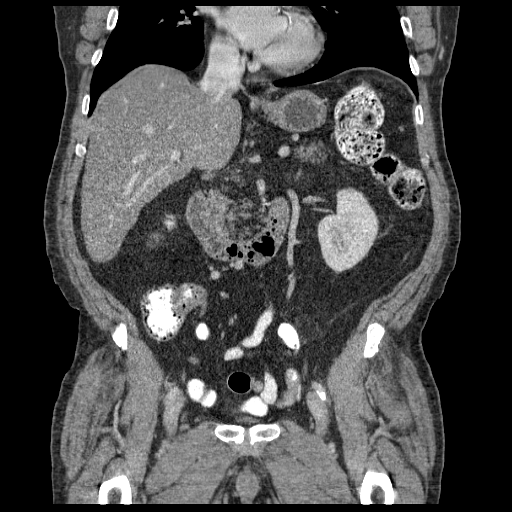

[16 of 46 positions shown; findings below may reference images not displayed]

FINDINGS: Post rectosigmoid resection with colostomy formation left lower
quadrant. Long segment of colon extends through the subcutaneous fat
of the colostomy but does not appear to be obstructed nor inflamed.
Overall, no extra luminal bowel inflammatory process, free fluid or
free air is noted.

Presacral soft tissue prominence extends to the base of the prostate
gland and may represent scar tissue from treatment of tumor. This
does not appear asymmetric to suggest recurrent tumor. Present exam
represents the patient's 1st baseline post therapy exam to which
follow-up examinations can be compared. No findings to suggest
surrounding inflammation.

Calcified prostate gland with minimal lobularity. Clinical
correlation recommended. Non contrast filled images of the urinary
bladder without abnormality.

Minimally prominent right inguinal lymph node with short axis
dimension of 10.8 mm. No other adenopathy noted.

Tiny nodule right middle lobe (series 3, image 8) unchanged.
Scarring/ atelectasis inferior aspect of the lingula.

Prominent coronary artery calcifications. Heart size within normal
limits. Prominent epicardial fat incidentally noted.

Fatty infiltration of the liver. 7 mm low-density structure within
the caudate lobe unchanged and too small to characterize.

No worrisome splenic, pancreatic, renal or adrenal lesion. No
calcified gallstones.

Mild atherosclerotic type changes of the aorta and moderate
atherosclerotic type changes of the iliac arteries without evidence
of aneurysmal dilation.
IMPRESSION: Post therapy changes without findings of inflammation or definitive
findings of recurrent tumor as detailed above.

## 2014-05-12 ENCOUNTER — Ambulatory Visit (HOSPITAL_COMMUNITY)
Admission: RE | Admit: 2014-05-12 | Discharge: 2014-05-12 | Disposition: A | Discharge status: Home / Self Care | Payer: Medicare Other | Source: Ambulatory Visit | Attending: General Surgery | Admitting: General Surgery

## 2014-05-12 ENCOUNTER — Encounter (HOSPITAL_COMMUNITY): Payer: Self-pay

## 2014-05-12 DIAGNOSIS — I2510 Atherosclerotic heart disease of native coronary artery without angina pectoris: Secondary | ICD-10-CM | POA: Diagnosis not present

## 2014-05-12 DIAGNOSIS — Z85048 Personal history of other malignant neoplasm of rectum, rectosigmoid junction, and anus: Secondary | ICD-10-CM | POA: Diagnosis not present

## 2014-05-12 DIAGNOSIS — C20 Malignant neoplasm of rectum: Secondary | ICD-10-CM | POA: Diagnosis not present

## 2014-05-12 DIAGNOSIS — I700 Atherosclerosis of aorta: Secondary | ICD-10-CM | POA: Insufficient documentation

## 2014-05-12 DIAGNOSIS — Z933 Colostomy status: Secondary | ICD-10-CM | POA: Diagnosis not present

## 2014-05-12 DIAGNOSIS — Z951 Presence of aortocoronary bypass graft: Secondary | ICD-10-CM | POA: Diagnosis not present

## 2014-05-12 DIAGNOSIS — T888XXA Other specified complications of surgical and medical care, not elsewhere classified, initial encounter: Secondary | ICD-10-CM | POA: Diagnosis not present

## 2014-05-12 LAB — POCT I-STAT CREATININE: CREATININE: 1 mg/dL (ref 0.50–1.35)

## 2014-05-12 MED ORDER — IOHEXOL 300 MG/ML  SOLN
100.0000 mL | Freq: Once | INTRAMUSCULAR | Status: AC | PRN
  Administered 2014-05-12: 100 mL via INTRAVENOUS

## 2014-06-04 ENCOUNTER — Other Ambulatory Visit: Payer: Self-pay | Admitting: Cardiology

## 2014-06-06 ENCOUNTER — Other Ambulatory Visit: Payer: Self-pay | Admitting: Cardiology

## 2014-06-27 ENCOUNTER — Ambulatory Visit: Payer: Medicare Other | Admitting: Cardiology

## 2014-06-27 DIAGNOSIS — C20 Malignant neoplasm of rectum: Secondary | ICD-10-CM | POA: Diagnosis not present

## 2014-06-27 DIAGNOSIS — Z79899 Other long term (current) drug therapy: Secondary | ICD-10-CM | POA: Diagnosis not present

## 2014-06-27 DIAGNOSIS — N400 Enlarged prostate without lower urinary tract symptoms: Secondary | ICD-10-CM | POA: Diagnosis not present

## 2014-06-27 DIAGNOSIS — Z6831 Body mass index (BMI) 31.0-31.9, adult: Secondary | ICD-10-CM | POA: Diagnosis not present

## 2014-06-27 DIAGNOSIS — Z23 Encounter for immunization: Secondary | ICD-10-CM | POA: Diagnosis not present

## 2014-06-27 DIAGNOSIS — Z0000 Encounter for general adult medical examination without abnormal findings: Secondary | ICD-10-CM | POA: Diagnosis not present

## 2014-06-27 DIAGNOSIS — Z125 Encounter for screening for malignant neoplasm of prostate: Secondary | ICD-10-CM | POA: Diagnosis not present

## 2014-06-27 DIAGNOSIS — F419 Anxiety disorder, unspecified: Secondary | ICD-10-CM | POA: Diagnosis not present

## 2014-06-27 DIAGNOSIS — E6609 Other obesity due to excess calories: Secondary | ICD-10-CM | POA: Diagnosis not present

## 2014-06-27 DIAGNOSIS — E782 Mixed hyperlipidemia: Secondary | ICD-10-CM | POA: Diagnosis not present

## 2014-06-27 DIAGNOSIS — L309 Dermatitis, unspecified: Secondary | ICD-10-CM | POA: Diagnosis not present

## 2014-06-27 DIAGNOSIS — R531 Weakness: Secondary | ICD-10-CM | POA: Diagnosis not present

## 2014-07-28 ENCOUNTER — Other Ambulatory Visit: Payer: Self-pay

## 2014-08-18 DIAGNOSIS — Z85048 Personal history of other malignant neoplasm of rectum, rectosigmoid junction, and anus: Secondary | ICD-10-CM | POA: Diagnosis not present

## 2014-09-19 ENCOUNTER — Ambulatory Visit: Payer: Medicare Other | Admitting: Cardiology

## 2014-10-10 DIAGNOSIS — Z85048 Personal history of other malignant neoplasm of rectum, rectosigmoid junction, and anus: Secondary | ICD-10-CM | POA: Diagnosis not present

## 2014-12-09 DIAGNOSIS — Z85048 Personal history of other malignant neoplasm of rectum, rectosigmoid junction, and anus: Secondary | ICD-10-CM | POA: Diagnosis not present

## 2015-01-21 DIAGNOSIS — E6609 Other obesity due to excess calories: Secondary | ICD-10-CM | POA: Diagnosis not present

## 2015-01-21 DIAGNOSIS — F419 Anxiety disorder, unspecified: Secondary | ICD-10-CM | POA: Diagnosis not present

## 2015-01-21 DIAGNOSIS — J301 Allergic rhinitis due to pollen: Secondary | ICD-10-CM | POA: Diagnosis not present

## 2015-01-21 DIAGNOSIS — Z6833 Body mass index (BMI) 33.0-33.9, adult: Secondary | ICD-10-CM | POA: Diagnosis not present

## 2015-01-21 DIAGNOSIS — Z1389 Encounter for screening for other disorder: Secondary | ICD-10-CM | POA: Diagnosis not present

## 2015-01-21 DIAGNOSIS — Z23 Encounter for immunization: Secondary | ICD-10-CM | POA: Diagnosis not present

## 2015-03-31 DIAGNOSIS — Z85048 Personal history of other malignant neoplasm of rectum, rectosigmoid junction, and anus: Secondary | ICD-10-CM | POA: Diagnosis not present

## 2015-03-31 DIAGNOSIS — C20 Malignant neoplasm of rectum: Secondary | ICD-10-CM | POA: Diagnosis not present

## 2015-05-04 ENCOUNTER — Other Ambulatory Visit: Payer: Self-pay | Admitting: General Surgery

## 2015-05-04 DIAGNOSIS — C20 Malignant neoplasm of rectum: Secondary | ICD-10-CM

## 2015-05-15 ENCOUNTER — Ambulatory Visit
Admission: RE | Admit: 2015-05-15 | Discharge: 2015-05-15 | Disposition: A | Discharge status: Home / Self Care | Payer: Medicare Other | Source: Ambulatory Visit | Attending: General Surgery | Admitting: General Surgery

## 2015-05-15 DIAGNOSIS — C20 Malignant neoplasm of rectum: Secondary | ICD-10-CM

## 2015-05-15 DIAGNOSIS — R918 Other nonspecific abnormal finding of lung field: Secondary | ICD-10-CM | POA: Diagnosis not present

## 2015-05-15 DIAGNOSIS — Z85048 Personal history of other malignant neoplasm of rectum, rectosigmoid junction, and anus: Secondary | ICD-10-CM | POA: Diagnosis not present

## 2015-05-15 MED ORDER — IOPAMIDOL (ISOVUE-300) INJECTION 61%
125.0000 mL | Freq: Once | INTRAVENOUS | Status: AC | PRN
  Administered 2015-05-15: 125 mL via INTRAVENOUS

## 2015-06-23 DIAGNOSIS — Z85048 Personal history of other malignant neoplasm of rectum, rectosigmoid junction, and anus: Secondary | ICD-10-CM | POA: Diagnosis not present

## 2015-06-30 DIAGNOSIS — Z0000 Encounter for general adult medical examination without abnormal findings: Secondary | ICD-10-CM | POA: Diagnosis not present

## 2015-06-30 DIAGNOSIS — Z1389 Encounter for screening for other disorder: Secondary | ICD-10-CM | POA: Diagnosis not present

## 2015-06-30 DIAGNOSIS — E6609 Other obesity due to excess calories: Secondary | ICD-10-CM | POA: Diagnosis not present

## 2015-06-30 DIAGNOSIS — R7309 Other abnormal glucose: Secondary | ICD-10-CM | POA: Diagnosis not present

## 2015-06-30 DIAGNOSIS — Z125 Encounter for screening for malignant neoplasm of prostate: Secondary | ICD-10-CM | POA: Diagnosis not present

## 2015-06-30 DIAGNOSIS — R945 Abnormal results of liver function studies: Secondary | ICD-10-CM | POA: Diagnosis not present

## 2015-06-30 DIAGNOSIS — Z6832 Body mass index (BMI) 32.0-32.9, adult: Secondary | ICD-10-CM | POA: Diagnosis not present

## 2015-06-30 DIAGNOSIS — E782 Mixed hyperlipidemia: Secondary | ICD-10-CM | POA: Diagnosis not present

## 2015-06-30 DIAGNOSIS — D696 Thrombocytopenia, unspecified: Secondary | ICD-10-CM | POA: Diagnosis not present

## 2015-06-30 DIAGNOSIS — N400 Enlarged prostate without lower urinary tract symptoms: Secondary | ICD-10-CM | POA: Diagnosis not present

## 2015-07-01 DIAGNOSIS — R7309 Other abnormal glucose: Secondary | ICD-10-CM | POA: Diagnosis not present

## 2015-07-01 DIAGNOSIS — R945 Abnormal results of liver function studies: Secondary | ICD-10-CM | POA: Diagnosis not present

## 2015-07-01 DIAGNOSIS — E782 Mixed hyperlipidemia: Secondary | ICD-10-CM | POA: Diagnosis not present

## 2015-07-02 DIAGNOSIS — Z0000 Encounter for general adult medical examination without abnormal findings: Secondary | ICD-10-CM | POA: Diagnosis not present

## 2015-07-24 DIAGNOSIS — I4891 Unspecified atrial fibrillation: Secondary | ICD-10-CM | POA: Diagnosis not present

## 2015-09-18 ENCOUNTER — Encounter: Payer: Self-pay | Admitting: Cardiology

## 2015-09-18 ENCOUNTER — Ambulatory Visit (INDEPENDENT_AMBULATORY_CARE_PROVIDER_SITE_OTHER): Payer: Medicare Other | Admitting: Cardiology

## 2015-09-18 VITALS — BP 136/76 | HR 62 | Ht 67.0 in | Wt 206.4 lb

## 2015-09-18 DIAGNOSIS — I4891 Unspecified atrial fibrillation: Secondary | ICD-10-CM | POA: Diagnosis not present

## 2015-09-18 DIAGNOSIS — I5021 Acute systolic (congestive) heart failure: Secondary | ICD-10-CM

## 2015-09-18 DIAGNOSIS — I5020 Unspecified systolic (congestive) heart failure: Secondary | ICD-10-CM | POA: Diagnosis not present

## 2015-09-18 MED ORDER — FUROSEMIDE 20 MG PO TABS: 20.0000 mg | ORAL_TABLET | Freq: Every day | ORAL | 2 refills | Status: DC

## 2015-09-18 MED ORDER — POTASSIUM CHLORIDE ER 10 MEQ PO TBCR: 10.0000 meq | EXTENDED_RELEASE_TABLET | Freq: Every day | ORAL | 2 refills | Status: DC

## 2015-09-18 MED ORDER — APIXABAN 5 MG PO TABS: 5.0000 mg | ORAL_TABLET | Freq: Two times a day (BID) | ORAL | 2 refills | Status: DC

## 2015-09-18 NOTE — Patient Instructions (Addendum)
Medication Instructions:  Stop aspirin  Start Eliquis 5mg  two times a day  Star lasix(furosemide) 20mg  daily.  Start KCL (potassium) 10 mEq daily  Labwork: Your physician recommends that you return for a FASTING lipid profile/CBCd/BMET/BNP on Monday.  Your physician recommends that you return for lab work in: 71 days--BMET    Testing/Procedures: Your physician has requested that you have an echocardiogram. Echocardiography is a painless test that uses sound waves to create images of your heart. It provides your doctor with information about the size and shape of your heart and how well your heart's chambers and valves are working. This procedure takes approximately one hour. There are no restrictions for this procedure.  Your physician has recommended that you have a sleep study. This test records several body functions during sleep, including: brain activity, eye movement, oxygen and carbon dioxide blood levels, heart rate and rhythm, breathing rate and rhythm, the flow of air through your mouth and nose, snoring, body muscle movements, and chest and belly movement.  Your physician has recommended that you wear a holter monitor. Holter monitors are medical devices that record the heart's electrical activity. Doctors most often use these monitors to diagnose arrhythmias. Arrhythmias are problems with the speed or rhythm of the heartbeat. The monitor is a small, portable device. You can wear one while you do your normal daily activities. This is usually used to diagnose what is causing palpitations/syncope (passing out). 54 HOUR   Follow-Up:  Your physician recommends that you schedule a follow-up appointment in: about 6 weeks with Dr Aundra Dubin    Any Other Special Instructions Will Be Listed Below (If Applicable). Your physician has recommended that you have a Cardioversion (DCCV). Electrical Cardioversion uses a jolt of electricity to your heart either through paddles or wired patches  attached to your chest. This is a controlled, usually prescheduled, procedure. Defibrillation is done under light anesthesia in the hospital, and you usually go home the day of the procedure. This is done to get your heart back into a normal rhythm. You are not awake for the procedure. Please see the instruction sheet given to you today.  This will be scheduled after you have been on Eliquis for at least 4 weeks. I will call you on Monday to schedule this.    If you need a refill on your cardiac medications before your next appointment, please call your pharmacy.

## 2015-09-20 NOTE — H&P (Signed)
Patient ID: Cody Coleman, male   DOB: 07-10-1945, 70 y.o.   MRN: [id number] PCP: Dr. Orson Ape  70 yo with history of CAD s/p CABG and rectal cancer s/p abdominal perineal resection in 5/14 presents for cardiology followup.  He had CABG x 6 in 2005.  Stress tests later in 2005 and in 2008 were normal.  As above, he had APR in 5/14.  He had a complicated colostomy revision in 12/14.  After this operation, he developed abdominal wall cellulitis and septic shock, also in 12/14.  He was positive for influenza also.  His colostomy will be permanent. He had moved to [facility] but has returned to live in [place] recently.   He has been feeling good generally.  Does lots of yardwork.  No exertional dyspnea, no chest pain.  He does have daytime sleepiness and snores loudly.  Today, he was noted to be in rate-controlled atrial fibrillation. He does not feel palpitations and did not realize anything was different.   ECG: atrial fibrillation, rate 62  Labs (3/14): LDL 83, HDL 64, TGs 203 Labs (4/14): K 3.4, creatinine 0.77 Labs (6/14): LDL 63, HDL 41 Labs (5/15): creatinine 0.78  PMH: 1. Rectal cancer: Abdominal perineal resection in 5/14, has permanent colostomy.  2. CAD: CABG in 2005 with LIMA-LAD, SVG-D, seq SVG-OM1/dLCx, seq SVG-PDA/PLV.  Stress myoviews in 11/05 and in 2008 were normal. 3. Hyperlipidemia 4. Atrial fibrillation: First noted 8/17.   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    FH: CAD (brother), CVA (father), CHF (mother).   ROS: All systems were reviewed and negative except as per HPI.   Current Outpatient Prescriptions  Medication Sig Dispense Refill  . ALPRAZolam (XANAX) 1 MG tablet Take 1 mg by mouth 4 (four) times daily as needed for anxiety or sleep.     Marland Kitchen atorvastatin (LIPITOR) 40 MG tablet TAKE 1 TABLET BY MOUTH DAILY 30 tablet 4  . docusate sodium (COLACE) 100 MG capsule Take 100  mg by mouth as needed for constipation.    Marland Kitchen oxyCODONE-acetaminophen (PERCOCET/ROXICET) 5-325 MG per tablet Take 1-2 tablets by mouth every 4 (four) hours as needed for moderate pain. 30 tablet 0  . polyethylene glycol (MIRALAX / GLYCOLAX) packet Take 17 g by mouth daily as needed. 14 each 0  . tamsulosin (FLOMAX) 0.4 MG CAPS Take 0.4 mg by mouth daily after breakfast.     . apixaban (ELIQUIS) 5 MG TABS tablet Take 1 tablet (5 mg total) by mouth 2 (two) times daily. 60 tablet 2  . furosemide (LASIX) 20 MG tablet Take 1 tablet (20 mg total) by mouth daily. 30 tablet 2  . potassium chloride (KLOR-CON 10) 10 MEQ tablet Take 1 tablet (10 mEq total) by mouth daily. 30 tablet 2   No current facility-administered medications for this visit.     BP 136/76   Pulse 62   Ht 5\' 7"  (1.702 m)   Wt 206 lb 6.4 oz (93.6 kg)   BMI 32.33 kg/m  General: NAD Neck: JVP 8-9 cm, no thyromegaly or thyroid nodule.  Lungs: Clear to auscultation bilaterally with normal respiratory effort. CV: Nondisplaced PMI.  Heart irregular S1/S2, +S4, no murmur.  1+ ankle edema.  No carotid bruit.  Normal pedal pulses.  Abdomen: Soft, nontender, no hepatosplenomegaly, no distention. Colostomy in place.  Skin: Intact without lesions or rashes.  Neurologic: Alert and oriented x 3.  Psych: Normal affect. Extremities:  No clubbing or cyanosis.  ] Assessment/Plan: 1. CAD: No ischemic symptoms.  He has done quite well since his CABG.  Last myoview in 2008 was normal.  I do not think that there is an indication for a functional study at this point given lack of symptoms.  I will have him continue statin.  2. Hyperlipidemia: Check lipids/LFTs today.  3. CHF: He appears volume overloaded on exam with peripheral edema and JVD, though he denies exertional dyspnea.  This may be related to atrial fibrillation.   - Start Lasix 20 mg daily with KCl 10 daily.  BMET/BNP today and repeat in 2 wks.  - Needs echo.  4. Atrial fibrillation:  Rate-controlled. He does not feel the atrial fibrillation, not sure how long it has been present (no ECG for several years).  Given lack of symptoms, could argue to continue rate control and add anticoagulation given CHADSVASC 3 (CHF, vascular disease, age).  However, he does appear to have developed CHF by exam so think a trial of cardioversion would be reasonable.  - Stop ASA, start Eliquis 5 mg bid.  CBC today.  - Does not appear to need a rate control medication.  - 48 hour holter to see if atrial fibrillation is persistent.  - DCCV in 4 weeks if he remains in atrial fibrillation.  We discussed risks/benefits of DCCV today and he agrees to proceed.  5. Suspect sleep apnea: Will arrange for sleep study.   Loralie Champagne 09/20/2015    Loralie Champagne 09/20/2015 11:07 AM

## 2015-09-21 ENCOUNTER — Other Ambulatory Visit: Payer: Medicare Other | Admitting: *Deleted

## 2015-09-21 ENCOUNTER — Ambulatory Visit (HOSPITAL_COMMUNITY): Payer: Medicare Other | Attending: Cardiovascular Disease

## 2015-09-21 ENCOUNTER — Other Ambulatory Visit: Payer: Self-pay

## 2015-09-21 ENCOUNTER — Telehealth: Payer: Self-pay | Admitting: Cardiology

## 2015-09-21 ENCOUNTER — Encounter: Payer: Self-pay | Admitting: *Deleted

## 2015-09-21 DIAGNOSIS — Z951 Presence of aortocoronary bypass graft: Secondary | ICD-10-CM | POA: Insufficient documentation

## 2015-09-21 DIAGNOSIS — I517 Cardiomegaly: Secondary | ICD-10-CM | POA: Insufficient documentation

## 2015-09-21 DIAGNOSIS — E785 Hyperlipidemia, unspecified: Secondary | ICD-10-CM | POA: Diagnosis not present

## 2015-09-21 DIAGNOSIS — I509 Heart failure, unspecified: Secondary | ICD-10-CM | POA: Diagnosis present

## 2015-09-21 DIAGNOSIS — I5021 Acute systolic (congestive) heart failure: Secondary | ICD-10-CM | POA: Insufficient documentation

## 2015-09-21 DIAGNOSIS — I2510 Atherosclerotic heart disease of native coronary artery without angina pectoris: Secondary | ICD-10-CM | POA: Insufficient documentation

## 2015-09-21 DIAGNOSIS — I4891 Unspecified atrial fibrillation: Secondary | ICD-10-CM | POA: Diagnosis not present

## 2015-09-21 LAB — CBC WITH DIFFERENTIAL/PLATELET
Basophils Absolute: 0 {cells}/uL (ref 0–200)
Basophils Relative: 0 %
Eosinophils Absolute: 520 {cells}/uL — ABNORMAL HIGH (ref 15–500)
Eosinophils Relative: 8 %
HCT: 42.9 % (ref 38.5–50.0)
Hemoglobin: 15 g/dL (ref 13.2–17.1)
Lymphocytes Relative: 28 %
Lymphs Abs: 1820 {cells}/uL (ref 850–3900)
MCH: 32.4 pg (ref 27.0–33.0)
MCHC: 35 g/dL (ref 32.0–36.0)
MCV: 92.7 fL (ref 80.0–100.0)
MPV: 10.6 fL (ref 7.5–12.5)
Monocytes Absolute: 715 {cells}/uL (ref 200–950)
Monocytes Relative: 11 %
Neutro Abs: 3445 {cells}/uL (ref 1500–7800)
Neutrophils Relative %: 53 %
Platelets: 156 10*3/uL (ref 140–400)
RBC: 4.63 MIL/uL (ref 4.20–5.80)
RDW: 14.9 % (ref 11.0–15.0)
WBC: 6.5 10*3/uL (ref 3.8–10.8)

## 2015-09-21 LAB — LIPID PANEL
CHOL/HDL RATIO: 2.6 ratio (ref <=5.0)
CHOLESTEROL: 136 mg/dL (ref 125–200)
HDL: 52 mg/dL (ref >=40)
LDL Cholesterol: 63 mg/dL (ref <130)
Triglycerides: 104 mg/dL (ref <150)
VLDL: 21 mg/dL (ref <30)

## 2015-09-21 LAB — BASIC METABOLIC PANEL WITH GFR
BUN: 18 mg/dL (ref 7–25)
CO2: 30 mmol/L (ref 20–31)
Calcium: 9.5 mg/dL (ref 8.6–10.3)
Chloride: 100 mmol/L (ref 98–110)
Creat: 0.9 mg/dL (ref 0.70–1.25)
Glucose, Bld: 105 mg/dL — ABNORMAL HIGH (ref 65–99)
Potassium: 3.7 mmol/L (ref 3.5–5.3)
Sodium: 141 mmol/L (ref 135–146)

## 2015-09-21 LAB — BRAIN NATRIURETIC PEPTIDE: Brain Natriuretic Peptide: 162 pg/mL — ABNORMAL HIGH (ref <100)

## 2015-09-21 MED ORDER — PERFLUTREN LIPID MICROSPHERE
1.0000 mL | INTRAVENOUS | Status: AC | PRN
  Administered 2015-09-21: 3 mL via INTRAVENOUS

## 2015-09-21 NOTE — Telephone Encounter (Signed)
Pt scheduled for cardioversion 10/21/15 at 1PM, Dr Aundra Dubin I spoke with Jonelle Sidle at [facility], booking number [phone]. In Basket message has been sent to pre cert. I reviewed instructions for cardioversion with pt and he was given a copy of the instructions.

## 2015-09-21 NOTE — Telephone Encounter (Signed)
New message  ° ° °Patient calling stating someone called her today returning call back  °

## 2015-09-23 ENCOUNTER — Ambulatory Visit (INDEPENDENT_AMBULATORY_CARE_PROVIDER_SITE_OTHER): Payer: Medicare Other

## 2015-09-23 DIAGNOSIS — I5021 Acute systolic (congestive) heart failure: Secondary | ICD-10-CM | POA: Diagnosis not present

## 2015-09-23 DIAGNOSIS — I4891 Unspecified atrial fibrillation: Secondary | ICD-10-CM | POA: Diagnosis not present

## 2015-10-02 ENCOUNTER — Other Ambulatory Visit: Payer: Medicare Other | Admitting: *Deleted

## 2015-10-02 DIAGNOSIS — I5021 Acute systolic (congestive) heart failure: Secondary | ICD-10-CM | POA: Diagnosis not present

## 2015-10-02 DIAGNOSIS — I4891 Unspecified atrial fibrillation: Secondary | ICD-10-CM

## 2015-10-02 LAB — BASIC METABOLIC PANEL
BUN: 17 mg/dL (ref 7–25)
CO2: 27 mmol/L (ref 20–31)
Calcium: 9.1 mg/dL (ref 8.6–10.3)
Chloride: 101 mmol/L (ref 98–110)
Creat: 0.87 mg/dL (ref 0.70–1.25)
Glucose, Bld: 91 mg/dL (ref 65–99)
Potassium: 4 mmol/L (ref 3.5–5.3)
Sodium: 138 mmol/L (ref 135–146)

## 2015-10-14 ENCOUNTER — Telehealth: Payer: Self-pay | Admitting: Cardiology

## 2015-10-14 NOTE — Telephone Encounter (Signed)
Pt c/o medication issue:  1. Name of Medication: Eliquis  2. How are you currently taking this medication (dosage and times per day)? 2x day  3. Are you having a reaction (difficulty breathing--STAT)? no  4. What is your medication issue? Itching and rash

## 2015-10-14 NOTE — Telephone Encounter (Signed)
Follow up       Pt is scheduled for a cardioversion on 10-21-15 at 11:30 at [facility].  It is not showing up on mychart.  Calling to make sure it has been scheduled.  Please call pt to let him know if cardioversion is still scheduled for 10-21-15

## 2015-10-14 NOTE — Telephone Encounter (Signed)
Pt states he has been experiencing all over his body itching and has rash in some places in his body, since  starting Eliquis medication. Pt states that the rash is not that bad. Pt has taken Benadryl medication and it helps with  the itching. Pt wants to know if Dr. Aundra Dubin consider other medication for him, but pt does not want to take coumadin. Pt is scheduled for Cardioversion on 10/21/15.

## 2015-10-14 NOTE — Telephone Encounter (Signed)
New message       Pt is having a cardioversion on 10-21-15.  He will run out of eliquis, lasix and potassium on the 18th.  Should he get these medications refilled?  Please call

## 2015-10-14 NOTE — Telephone Encounter (Signed)
Pt called to find out if he is scheduled for the Cardioversion on 9/20 because he is not able to see this scheduled on E-Chart. Pt  Was made aware that he is scheduled for the Cardioversion to be done in the Endoscopy lab et [facility] on 9/20. Pt verbalized understanding.

## 2015-10-21 ENCOUNTER — Ambulatory Visit (HOSPITAL_COMMUNITY)
Admission: RE | Admit: 2015-10-21 | Discharge: 2015-10-21 | Disposition: A | Discharge status: Home / Self Care | Payer: Medicare Other | Source: Ambulatory Visit | Attending: Cardiology | Admitting: Cardiology

## 2015-10-21 ENCOUNTER — Encounter (HOSPITAL_COMMUNITY): Payer: Self-pay | Admitting: *Deleted

## 2015-10-21 ENCOUNTER — Encounter (HOSPITAL_COMMUNITY)
Admission: RE | Disposition: A | Discharge status: Home / Self Care | Payer: Self-pay | Source: Ambulatory Visit | Attending: Cardiology

## 2015-10-21 ENCOUNTER — Ambulatory Visit (HOSPITAL_COMMUNITY): Payer: Medicare Other | Admitting: Certified Registered Nurse Anesthetist

## 2015-10-21 DIAGNOSIS — I252 Old myocardial infarction: Secondary | ICD-10-CM | POA: Diagnosis not present

## 2015-10-21 DIAGNOSIS — I4891 Unspecified atrial fibrillation: Secondary | ICD-10-CM | POA: Diagnosis not present

## 2015-10-21 DIAGNOSIS — Z951 Presence of aortocoronary bypass graft: Secondary | ICD-10-CM | POA: Insufficient documentation

## 2015-10-21 DIAGNOSIS — J449 Chronic obstructive pulmonary disease, unspecified: Secondary | ICD-10-CM | POA: Insufficient documentation

## 2015-10-21 DIAGNOSIS — I2510 Atherosclerotic heart disease of native coronary artery without angina pectoris: Secondary | ICD-10-CM | POA: Insufficient documentation

## 2015-10-21 DIAGNOSIS — Z87891 Personal history of nicotine dependence: Secondary | ICD-10-CM | POA: Diagnosis not present

## 2015-10-21 HISTORY — PX: CARDIOVERSION: SHX1299

## 2015-10-21 LAB — POCT I-STAT, CHEM 8
BUN: 19 mg/dL (ref 6–20)
CHLORIDE: 100 mmol/L — AB (ref 101–111)
Calcium, Ion: 1.13 mmol/L — ABNORMAL LOW (ref 1.15–1.40)
Creatinine, Ser: 0.8 mg/dL (ref 0.61–1.24)
Glucose, Bld: 96 mg/dL (ref 65–99)
HEMATOCRIT: 43 % (ref 39.0–52.0)
Hemoglobin: 14.6 g/dL (ref 13.0–17.0)
POTASSIUM: 3.9 mmol/L (ref 3.5–5.1)
SODIUM: 141 mmol/L (ref 135–145)
TCO2: 27 mmol/L (ref 0–100)

## 2015-10-21 SURGERY — CARDIOVERSION
Anesthesia: General

## 2015-10-21 MED ORDER — LIDOCAINE HCL (CARDIAC) 20 MG/ML IV SOLN
INTRAVENOUS | Status: DC | PRN
  Administered 2015-10-21: 40 mg via INTRATRACHEAL

## 2015-10-21 MED ORDER — PROPOFOL 10 MG/ML IV BOLUS
INTRAVENOUS | Status: DC | PRN
  Administered 2015-10-21: 80 mg via INTRAVENOUS

## 2015-10-21 MED ORDER — SODIUM CHLORIDE 0.9 % IV SOLN
INTRAVENOUS | Status: DC
  Administered 2015-10-21 (×2): via INTRAVENOUS

## 2015-10-21 NOTE — Discharge Instructions (Signed)
Moderate Conscious Sedation, Adult Sedation is the use of medicines to promote relaxation and relieve discomfort and anxiety. Moderate conscious sedation is a type of sedation. Under moderate conscious sedation you are less alert than normal but are still able to respond to instructions or stimulation. Moderate conscious sedation is used during short medical and dental procedures. It is milder than deep sedation or general anesthesia and allows you to return to your regular activities sooner. LET [facility] CARE PROVIDER KNOW ABOUT:   Any allergies you have.  All medicines you are taking, including vitamins, herbs, eye drops, creams, and over-the-counter medicines.  Use of steroids (by mouth or creams).  Previous problems you or members of your family have had with the use of anesthetics.  Any blood disorders you have.  Previous surgeries you have had.  Medical conditions you have.  Possibility of pregnancy, if this applies.  Use of cigarettes, alcohol, or illegal drugs. RISKS AND COMPLICATIONS Generally, this is a safe procedure. However, as with any procedure, problems can occur. Possible problems include:  Oversedation.  Trouble breathing on your own. You may need to have a breathing tube until you are awake and breathing on your own.  Allergic reaction to any of the medicines used for the procedure. BEFORE THE PROCEDURE  You may have blood tests done. These tests can help show how well your kidneys and liver are working. They can also show how well your blood clots.  A physical exam will be done.  Only take medicines as directed by your health care provider. You may need to stop taking medicines (such as blood thinners, aspirin, or nonsteroidal anti-inflammatory drugs) before the procedure.   Do not eat or drink at least 6 hours before the procedure or as directed by your health care provider.  Arrange for a responsible adult, family member, or friend to take you home  after the procedure. He or she should stay with you for at least 24 hours after the procedure, until the medicine has worn off. PROCEDURE   An intravenous (IV) catheter will be inserted into one of your veins. Medicine will be able to flow directly into your body through this catheter. You may be given medicine through this tube to help prevent pain and help you relax.  The medical or dental procedure will be done. AFTER THE PROCEDURE  You will stay in a recovery area until the medicine has worn off. Your blood pressure and pulse will be checked.   Depending on the procedure you had, you may be allowed to go home when you can tolerate liquids and your pain is under control.   This information is not intended to replace advice given to you by your health care provider. Make sure you discuss any questions you have with your health care provider.   Document Released: 10/12/2000 Document Revised: 02/07/2014 Document Reviewed: 09/24/2012 Elsevier Interactive Patient Education 2016 [employer]. Electrical Cardioversion, Care After Refer to this sheet in the next few weeks. These instructions provide you with information on caring for yourself after your procedure. Your health care provider may also give you more specific instructions. Your treatment has been planned according to current medical practices, but problems sometimes occur. Call your health care provider if you have any problems or questions after your procedure. WHAT TO EXPECT AFTER THE PROCEDURE After your procedure, it is typical to have the following sensations:  Some redness on the skin where the shocks were delivered. If this is tender, a sunburn  lotion or hydrocortisone cream may help.  Possible return of an abnormal heart rhythm within hours or days after the procedure. HOME CARE INSTRUCTIONS  Take medicines only as directed by your health care provider. Be sure you understand how and when to take your medicine.  Learn  how to feel your pulse and check it often.  Limit your activity for 48 hours after the procedure or as directed by your health care provider.  Avoid or minimize caffeine and other stimulants as directed by your health care provider. SEEK MEDICAL CARE IF:  You feel like your heart is beating too fast or your pulse is not regular.  You have any questions about your medicines.  You have bleeding that will not stop. SEEK IMMEDIATE MEDICAL CARE IF:  You are dizzy or feel faint.  It is hard to breathe or you feel short of breath.  There is a change in discomfort in your chest.  Your speech is slurred or you have trouble moving an arm or leg on one side of your body.  You get a serious muscle cramp that does not go away.  Your fingers or toes turn cold or blue.   This information is not intended to replace advice given to you by your health care provider. Make sure you discuss any questions you have with your health care provider.   Document Released: 11/07/2012 Document Revised: 02/07/2014 Document Reviewed: 11/07/2012 Elsevier Interactive Patient Education [employer].

## 2015-10-21 NOTE — Procedures (Signed)
Electrical Cardioversion Procedure Note TRUE COWDIN [id number] 1945/10/04  Procedure: Electrical Cardioversion Indications:  Atrial Fibrillation  Procedure Details Consent: Risks of procedure as well as the alternatives and risks of each were explained to the (patient/caregiver).  Consent for procedure obtained. Time Out: Verified patient identification, verified procedure, site/side was marked, verified correct patient position, special equipment/implants available, medications/allergies/relevent history reviewed, required imaging and test results available.  Performed  Patient placed on cardiac monitor, pulse oximetry, supplemental oxygen as necessary.  Sedation given: Per anesthesiology Pacer pads placed anterior and posterior chest.  Cardioverted 2 time(s).  Cardioverted at 200J, the 2nd time with sternal pressure.   Evaluation Findings: Post procedure EKG shows: NSR.  However, atrial fibrillation recurred within about 10 minutes.  Given minimal symptoms, will not cardiovert again.   Complications: None Patient did tolerate procedure well.  He has itching with Eliquis, will switch to Xarelto eventually.    Loralie Champagne 10/21/2015, 1:11 PM

## 2015-10-21 NOTE — Transfer of Care (Signed)
Immediate Anesthesia Transfer of Care Note  Patient: Cody Coleman  Procedure(s) Performed: Procedure(s): CARDIOVERSION (N/A)  Patient Location: Endoscopy Unit  Anesthesia Type:MAC  Level of Consciousness: sedated  Airway & Oxygen Therapy: Patient Spontanous Breathing and Patient connected to nasal cannula oxygen  Post-op Assessment: Report given to RN and Post -op Vital signs reviewed and stable  Post vital signs: Reviewed and stable  Last Vitals:  Vitals:   10/21/15 1216  BP: (!) 141/81  Pulse: (!) 57  Resp: 18  Temp: 36.6 C    Last Pain:  Vitals:   10/21/15 1216  TempSrc: Oral         Complications: No apparent anesthesia complications

## 2015-10-21 NOTE — H&P (Signed)
  Patient remains in atrial fibrillation and has not missed Eliquis.  Otherwise, no significant changes from 09/16/15 note.   Loralie Champagne 10/21/2015

## 2015-10-21 NOTE — Anesthesia Postprocedure Evaluation (Signed)
Anesthesia Post Note  Patient: Cody Coleman  Procedure(s) Performed: Procedure(s) (LRB): CARDIOVERSION (N/A)  Patient location during evaluation: PACU Anesthesia Type: General Level of consciousness: awake and alert Pain management: pain level controlled Vital Signs Assessment: post-procedure vital signs reviewed and stable Respiratory status: spontaneous breathing, nonlabored ventilation, respiratory function stable and patient connected to nasal cannula oxygen Cardiovascular status: blood pressure returned to baseline and stable Postop Assessment: no signs of nausea or vomiting Anesthetic complications: no    Last Vitals:  Vitals:   10/21/15 1330 10/21/15 1340  BP: 127/60 109/74  Pulse: (!) 29 74  Resp: 20 18  Temp:      Last Pain:  Vitals:   10/21/15 1327  TempSrc: Oral                 Tiajuana Amass

## 2015-10-21 NOTE — Anesthesia Preprocedure Evaluation (Addendum)
Anesthesia Evaluation  Patient identified by MRN, date of birth, ID band Patient awake    Reviewed: Allergy & Precautions, NPO status , Patient's Chart, lab work & pertinent test results  Airway Mallampati: II  TM Distance: >3 FB Neck ROM: Full    Dental  (+) Missing, Caps, Dental Advisory Given   Pulmonary COPD, former smoker,    breath sounds clear to auscultation       Cardiovascular + CAD, + Past MI and + CABG   Rhythm:Irregular Rate:Normal  Left ventricle: The cavity size was mildly dilated. Wall   thickness was increased in a pattern of mild LVH. Systolic   function was mildly reduced. The estimated ejection fraction was   in the range of 45% to 50%. Left ventricular diastolic function   parameters were normal. - Left atrium: The atrium was moderately to severely dilated. - Atrial septum: No defect or patent foramen ovale was identified.   Neuro/Psych negative neurological ROS     GI/Hepatic negative GI ROS, Neg liver ROS,   Endo/Other  negative endocrine ROS  Renal/GU negative Renal ROS     Musculoskeletal   Abdominal   Peds  Hematology negative hematology ROS (+)   Anesthesia Other Findings   Reproductive/Obstetrics                            Anesthesia Physical Anesthesia Plan  ASA: III  Anesthesia Plan: General   Post-op Pain Management:    Induction: Intravenous  Airway Management Planned: Mask and Natural Airway  Additional Equipment:   Intra-op Plan:   Post-operative Plan:   Informed Consent: I have reviewed the patients History and Physical, chart, labs and discussed the procedure including the risks, benefits and alternatives for the proposed anesthesia with the patient or authorized representative who has indicated his/her understanding and acceptance.   Dental advisory given  Plan Discussed with:   Anesthesia Plan Comments:         Anesthesia Quick  Evaluation

## 2015-10-21 NOTE — Anesthesia Procedure Notes (Signed)
Procedure Name: MAC Date/Time: 10/21/2015 12:57 PM Performed by: Trixie Deis A Pre-anesthesia Checklist: Patient identified, Emergency Drugs available, Suction available, Patient being monitored and Timeout performed Oxygen Delivery Method: Ambu bag Preoxygenation: Pre-oxygenation with 100% oxygen

## 2015-10-28 ENCOUNTER — Ambulatory Visit (HOSPITAL_COMMUNITY)
Admission: RE | Admit: 2015-10-28 | Discharge: 2015-10-28 | Disposition: A | Discharge status: Home / Self Care | Payer: Medicare Other | Source: Ambulatory Visit | Attending: Nurse Practitioner | Admitting: Nurse Practitioner

## 2015-10-28 ENCOUNTER — Encounter (HOSPITAL_COMMUNITY): Payer: Self-pay | Admitting: Nurse Practitioner

## 2015-10-28 VITALS — BP 126/82 | HR 81 | Ht 67.0 in | Wt 198.8 lb

## 2015-10-28 DIAGNOSIS — Z880 Allergy status to penicillin: Secondary | ICD-10-CM | POA: Diagnosis not present

## 2015-10-28 DIAGNOSIS — I2510 Atherosclerotic heart disease of native coronary artery without angina pectoris: Secondary | ICD-10-CM | POA: Diagnosis not present

## 2015-10-28 DIAGNOSIS — E7800 Pure hypercholesterolemia, unspecified: Secondary | ICD-10-CM | POA: Diagnosis not present

## 2015-10-28 DIAGNOSIS — I4891 Unspecified atrial fibrillation: Secondary | ICD-10-CM | POA: Diagnosis not present

## 2015-10-28 DIAGNOSIS — I252 Old myocardial infarction: Secondary | ICD-10-CM | POA: Insufficient documentation

## 2015-10-28 DIAGNOSIS — I4581 Long QT syndrome: Secondary | ICD-10-CM | POA: Insufficient documentation

## 2015-10-28 DIAGNOSIS — Z951 Presence of aortocoronary bypass graft: Secondary | ICD-10-CM | POA: Diagnosis not present

## 2015-10-28 DIAGNOSIS — I5021 Acute systolic (congestive) heart failure: Secondary | ICD-10-CM | POA: Diagnosis not present

## 2015-10-28 DIAGNOSIS — Z7901 Long term (current) use of anticoagulants: Secondary | ICD-10-CM | POA: Insufficient documentation

## 2015-10-28 DIAGNOSIS — Z87891 Personal history of nicotine dependence: Secondary | ICD-10-CM | POA: Diagnosis not present

## 2015-10-28 DIAGNOSIS — N400 Enlarged prostate without lower urinary tract symptoms: Secondary | ICD-10-CM | POA: Insufficient documentation

## 2015-10-28 DIAGNOSIS — Z9889 Other specified postprocedural states: Secondary | ICD-10-CM | POA: Insufficient documentation

## 2015-10-28 DIAGNOSIS — I481 Persistent atrial fibrillation: Secondary | ICD-10-CM | POA: Diagnosis not present

## 2015-10-28 DIAGNOSIS — Z79899 Other long term (current) drug therapy: Secondary | ICD-10-CM | POA: Diagnosis not present

## 2015-10-28 DIAGNOSIS — Z85048 Personal history of other malignant neoplasm of rectum, rectosigmoid junction, and anus: Secondary | ICD-10-CM | POA: Diagnosis not present

## 2015-10-28 LAB — TSH: TSH: 1.027 u[IU]/mL (ref 0.350–4.500)

## 2015-10-28 MED ORDER — APIXABAN 5 MG PO TABS: 5.0000 mg | ORAL_TABLET | Freq: Two times a day (BID) | ORAL | 2 refills | Status: DC

## 2015-10-28 NOTE — Progress Notes (Addendum)
Primary Care Physician: Glo Herring., MD Referring Physician: self referred Cardiologist: Dr. Daine Gip Cody Coleman is a 70 y.o. male with a h/o  CAD s/p CABG and rectal cancer s/p abdominal perineal resection in 5/14.  He had CABG x 6 in 2005.   He had a complicated colostomy revision in 12/14, with permanent colostomy.  After this operation, he developed abdominal wall cellulitis and septic shock, also in 12/14. At that time his EKG's show a rapid irregular rhythm which appears to be afib to me but EKG interpretation reads s tach with irregular rate.  He was also positive for influenza.  He had moved to [facility], had bought a beach home and planned to retire there, but due to health problems and need for close medical f/u,  returned to live in [place] recently.   He went to see Dr. Aundra Dubin as a regular check up in August, not seeing him in some time due to living out of town. He was found to in rate control afib.  He was and still is completely asymptomatic.He was started on eliquis 5 mg bid with plans for cardioversion. He had ERAF within minutes after being cardioverted. He was frustrated because he was felt that he was left without a plan, didn't understand long term consequences of afib and wanted to talk to someone that could answer his questions. Therefore, he found the afib clinic and is here today to address his concerns.  Echo showed mildly reduced EF of 45 to 50% with left atrial size of 56 mm. I do not see in current Epic records prior echo to compare. He reported that he got depressed after his CA diagnosis/tx because it messed up his whole retirement plan and started drinking 7 alcoholic drinks a day. With the afib  diagnosis, he stopped drinking. Sleep study is ordered but pt and wife state no observed periods of snoring/apnea. He does not have daytime somnolence.He is rate controlled without rate control med on board.He should continue with Eliquis long term,  now with  Echo  showing LV dysfunction and age with  CHA2DS2-VASc Score of two but is paying $100 a month for this drug and wants to look at other options. All of EKG's show prolonged qtc interaval, all over 440 ms, today at 490 ms, which would limit rhythm control if considered an option.  Today, he denies symptoms of palpitations, chest pain, shortness of breath, orthopnea, PND, lower extremity edema, dizziness, presyncope, syncope, or neurologic sequela. The patient is tolerating medications without difficulties and is otherwise without complaint today.   Past Medical History:  Diagnosis Date  . BPH (benign prostatic hyperplasia)   . CAD (coronary artery disease)   . Cancer (HCC)    RECTAL CANCER--SOME RECTAL BLEEDING  . Former tobacco use   . High cholesterol   . MI (myocardial infarction) ([place])    x 2  AT AGE 61 AND AT AGE 36  DR. [facility] IS PT'S MEDICAL DOCTOR  . Pain    RIGHT KNEE PAIN AND OCCAS OTHER JOINT PAINS  . Sleep difficulties    NEVER SLEEPS WELL - WAKES UP AFTER SEVERAL HOURS AND NOT ABLE TO GO BACK TO SLEEP--DID SLEEP STUDY AND TOLD HE DID NOT HAVE SLEEP APNEA  . Urgency-frequency syndrome    FLOMAX HAS HELPED   Past Surgical History:  Procedure Laterality Date  . CARDIOVERSION N/A 10/21/2015   Procedure: CARDIOVERSION;  Surgeon: Larey Dresser, MD;  Location: [place];  Service: Cardiovascular;  Laterality: N/A;  . COLONOSCOPY N/A 04/20/2012   Procedure: COLONOSCOPY;  Surgeon: Rogene Houston, MD;  [facility];  Service: Endoscopy;  Laterality: N/A;  225  . COLONOSCOPY N/A 06/14/2013   Procedure: COLONOSCOPY;  Surgeon: Rogene Houston, MD;  [facility];  Service: Endoscopy;  Laterality: N/A;  1245  . COLOSTOMY REVISION N/A 01/17/2013   Procedure: COLOSTOMY REVISION;  Surgeon: Leighton Ruff, MD;  Location: [place];  Service: General;  Laterality: N/A;  . CORONARY ANGIOPLASTY WITH STENT PLACEMENT     before bypass  . CORONARY ARTERY BYPASS GRAFT     2005  .  EUS N/A 05/03/2012   Procedure: LOWER ENDOSCOPIC ULTRASOUND (EUS);  Surgeon: Milus Banister, MD;  Location: [facility];  Service: Endoscopy;  Laterality: N/A;  . INSERTION OF MESH N/A 10/25/2013   Procedure: INSERTION OF STRATTICE MESH;  Surgeon: Leighton Ruff, MD;  Location: [facility];  Service: General;  Laterality: N/A;  . LAPAROSCOPIC ASSISTED ABDOMINAL PERINEAL RESECTION N/A 06/11/2012   Procedure: LAPAROSCOPIC ASSISTED ABDOMINAL PERINEAL RESECTION;  Surgeon: Leighton Ruff, MD;  Location: [facility];  Service: General;  Laterality: N/A;  . LAPAROSCOPIC PARASTOMAL HERNIA N/A 10/25/2013   Procedure: ROBOTIC PARASTOMAL HERNIA REPAIR WITH STRATTICE MESH;  Surgeon: Leighton Ruff, MD;  Location: [facility];  Service: General;  Laterality: N/A;  . TONSILLECTOMY      Current Outpatient Prescriptions  Medication Sig Dispense Refill  . ALPRAZolam (XANAX) 1 MG tablet Take 1 mg by mouth 4 (four) times daily as needed for anxiety or sleep.     Marland Kitchen apixaban (ELIQUIS) 5 MG TABS tablet Take 1 tablet (5 mg total) by mouth 2 (two) times daily. 60 tablet 2  . atorvastatin (LIPITOR) 40 MG tablet TAKE 1 TABLET BY MOUTH DAILY 30 tablet 4  . furosemide (LASIX) 20 MG tablet Take 1 tablet (20 mg total) by mouth daily. 30 tablet 2  . potassium chloride (KLOR-CON 10) 10 MEQ tablet Take 1 tablet (10 mEq total) by mouth daily. 30 tablet 2  . tamsulosin (FLOMAX) 0.4 MG CAPS Take 0.4 mg by mouth daily after breakfast.      No current facility-administered medications for this encounter.     Allergies  Allergen Reactions  . Augmentin [Amoxicillin-Pot Clavulanate] Swelling and Rash    Social History   Social History  . Marital status: Married    Spouse name: N/A  . Number of children: N/A  . Years of education: N/A   Occupational History  . Not on file.   Social History Main Topics  . Smoking status: Former Smoker    Packs/day: 1.00    Years: 30.00    Types: Cigarettes    Quit date: 01/31/2002  . Smokeless tobacco:  Never Used  . Alcohol use Yes     Comment: drinks 2-3 drinks on occasion  . Drug use: No  . Sexual activity: No   Other Topics Concern  . Not on file   Social History Narrative   Lives with wife.  Drives, no assist devices.    Family History  Problem Relation Age of Onset  . Congestive Heart Failure Mother   . Stroke Father   . Coronary artery disease Father   . Coronary artery disease Brother   . Colon cancer Neg Hx     ROS- All systems are reviewed and negative except as per the HPI above  Physical Exam: Vitals:   10/28/15 0933  BP: 126/82  Pulse:  81  Weight: 198 lb 12.8 oz (90.2 kg)  Height: 5\' 7"  (1.702 m)    GEN- The patient is well appearing, alert and oriented x 3 today.   Head- normocephalic, atraumatic Eyes-  Sclera clear, conjunctiva pink Ears- hearing intact Oropharynx- clear Neck- supple, no JVP Lymph- no cervical lymphadenopathy Lungs- Clear to ausculation bilaterally, normal work of breathing Heart- irregular rate and rhythm, no murmurs, rubs or gallops, PMI not laterally displaced GI- soft, NT, ND, + BS Extremities- no clubbing, cyanosis, or edema MS- no significant deformity or atrophy Skin- no rash or lesion Psych- euthymic mood, full affect Neuro- strength and sensation are intact  EKG- afib at 81 bpm, qrs int 98 ms, qtc 490 ms Epic records reviewed Echo-Study Conclusions  - Left ventricle: The cavity size was mildly dilated. Wall   thickness was increased in a pattern of mild LVH. Systolic   function was mildly reduced. The estimated ejection fraction was   in the range of 45% to 50%. Left ventricular diastolic function   parameters were normal. - Left atrium: The atrium was moderately to severely dilated, 56 mm. - Atrial septum: No defect or patent foramen ovale was identified.     Assessment and Plan:    1.Persisitent asymptomatic afib Unknown how long pt has been in afib but suspect has been some time with resistance to  cardiovertion and size of Left atrium And for that reason, it may be difficult to return to SR QTc is prolonged and would limit rhythm control strategies He is asymptomatic and is rate controlled without med on board For these reasons, I think a rate control strategy is reasonable Continue eliquis long term with chadsvasc of 2 but will check with insurance if xarelto may be cheaper, coumadin is other option Afib education to pt and wife and written literature given, reassured  2. Mild LV dysfunction Unsure if afib is culprit, doubt that he has had prolonged tachycardia Possibly due to long term alcohol use of 7 drinks a day He has now stopped alcohol as of afib dx  3. Lifestyle issues As above previous alcohol use Wife denies snoring or related symptoms in husband,  for now sleep study remains ordered, but pt really is wondering if needed Regular exercise encouraged No excessive caffeine or stimulant use  4. CAD Stable Per Dr. Aundra Dubin Pt does state that he has a hernia surgery pending in the near future and will need cardiac clearance   F/u with  Dr. Aundra Dubin 10/18 afib clinic as needed  Butch Penny C. Mila [facility] [address] [place], [place] [address] [phone]   9/28- pt notified office that xarelto was in touch with him and it will be cheaper for him to use xarelto. Creat clearance cal at 103 and he will be prescribed 20 mg daily.He has taken am dose of eliquis, will stop and start xarelto 20 mg at suppertime tonight and then once daily after that.

## 2015-10-29 ENCOUNTER — Other Ambulatory Visit (HOSPITAL_COMMUNITY): Payer: Self-pay | Admitting: *Deleted

## 2015-10-29 MED ORDER — RIVAROXABAN 20 MG PO TABS: 20.0000 mg | ORAL_TABLET | Freq: Every day | ORAL | 6 refills | Status: DC

## 2015-10-29 NOTE — Addendum Note (Signed)
Encounter addended by: Sherran Needs, NP on: 10/29/2015 11:20 AM<BR>    Actions taken: Sign clinical note

## 2015-11-04 ENCOUNTER — Encounter: Payer: Self-pay | Admitting: Cardiology

## 2015-11-08 ENCOUNTER — Encounter (HOSPITAL_BASED_OUTPATIENT_CLINIC_OR_DEPARTMENT_OTHER): Payer: Medicare Other

## 2015-11-18 ENCOUNTER — Encounter: Payer: Self-pay | Admitting: Cardiology

## 2015-11-18 ENCOUNTER — Ambulatory Visit (INDEPENDENT_AMBULATORY_CARE_PROVIDER_SITE_OTHER): Payer: Medicare Other | Admitting: Cardiology

## 2015-11-18 DIAGNOSIS — I4891 Unspecified atrial fibrillation: Secondary | ICD-10-CM | POA: Diagnosis not present

## 2015-11-18 DIAGNOSIS — I5021 Acute systolic (congestive) heart failure: Secondary | ICD-10-CM

## 2015-11-18 LAB — BASIC METABOLIC PANEL
BUN: 12 mg/dL (ref 7–25)
CALCIUM: 9.1 mg/dL (ref 8.6–10.3)
CO2: 30 mmol/L (ref 20–31)
Chloride: 103 mmol/L (ref 98–110)
Creat: 0.77 mg/dL (ref 0.70–1.25)
GLUCOSE: 92 mg/dL (ref 65–99)
POTASSIUM: 4.2 mmol/L (ref 3.5–5.3)
SODIUM: 142 mmol/L (ref 135–146)

## 2015-11-18 NOTE — Patient Instructions (Addendum)
Medication Instructions:  Decrease lasix to 20mg  every other day. Decrease KCL (potassium) to 10 mEq every other day.  Use claritin 10mg  daily as needed for nasal congestion.--you do not need a prescription for this. Use Flonase Nasal Spray daily as needed for nasal congestion.--you do not need a prescription for this.  Labwork: BMET/BNP today  Testing/Procedures: Your physician has requested that you have en exercise stress myoview. For further information please visit [email or web address]. Please follow instruction sheet, as given.    Follow-Up: Your physician recommends that you schedule a follow-up appointment in: 1 month with Dr Aundra Dubin.        If you need a refill on your cardiac medications before your next appointment, please call your pharmacy.

## 2015-11-19 LAB — BRAIN NATRIURETIC PEPTIDE: BRAIN NATRIURETIC PEPTIDE: 108.5 pg/mL — AB (ref <100)

## 2015-11-19 NOTE — Progress Notes (Signed)
PCP: Dr. Orson Ape  70 yo with history of CAD s/p CABG and rectal cancer s/p abdominal perineal resection in 5/14 presents for cardiology followup.  He had CABG x 6 in 2005.  Stress tests later in 2005 and in 2008 were normal.  As above, he had APR in 5/14.  He had a complicated colostomy revision in 12/14.  After this operation, he developed abdominal wall cellulitis and septic shock, also in 12/14.  He was positive for influenza also.  His colostomy will be permanent. He had moved to [facility] but has returned to live in [place] recently.   At last appointment, he was noted to be in atrial fibrillation with rate control.  He did not realize that he was in atrial fibrillation.  Holter monitor showed persistent AF.  He looked volume overloaded on exam and Lasix was started.  Echo in 8/17 showed EF 45-50%, diffuse hypokinesis.  He underwent DCCV 9/17 but did not hold NSR.  Today, he is actually back in NSR.  He is on Xarelto but feels like it is making him fatigued and wants to switch medications.  He could not take Eliquis due to itching. He is generally fatigued but denies chest pain/tightness or exertional dyspnea.    ECG: NSR (small, diseased-appearing Ps), PACs, QTc 465  Labs (3/14): LDL 83, HDL 64, TGs 203 Labs (4/14): K 3.4, creatinine 0.77 Labs (6/14): LDL 63, HDL 41 Labs (5/15): creatinine 0.78 Labs (8/17): LDL 63, HDL 52 Labs (9/17): TSH normal, K 4, creatinine 0.87  PMH: 1. Rectal cancer: Abdominal perineal resection in 5/14, has permanent colostomy.  2. CAD: CABG in 2005 with LIMA-LAD, SVG-D, seq SVG-OM1/dLCx, seq SVG-PDA/PLV.  Stress myoviews in 11/05 and in 2008 were normal. 3. Hyperlipidemia 4. Atrial fibrillation: First noted 8/17.  - DCCV 9/17: Converted to NSR but went back into atrial fibrillation before leaving hospital.  5. Chronic primarily diastolic CHF: Echo (0000000) with EF 45-50%, mild LVH, mildly dilated LV.    SH: Lives in [place] with wife, quit smoking 2004,  continues moderate ETOH intake, retired from Research officer, trade union and Production manager (Engineer, production).    FH: CAD (brother), CVA (father), CHF (mother).   ROS: All systems were reviewed and negative except as per HPI.   Current Outpatient Prescriptions  Medication Sig Dispense Refill  . ALPRAZolam (XANAX) 1 MG tablet Take 1 mg by mouth 4 (four) times daily as needed for anxiety or sleep.     Marland Kitchen atorvastatin (LIPITOR) 40 MG tablet TAKE 1 TABLET BY MOUTH DAILY 30 tablet 4  . furosemide (LASIX) 20 MG tablet Take 1 tablet (20 mg total) by mouth every other day.    . potassium chloride (KLOR-CON 10) 10 MEQ tablet Take 1 tablet (10 mEq total) by mouth every other day.    . rivaroxaban (XARELTO) 20 MG TABS tablet Take 1 tablet (20 mg total) by mouth daily with supper. 30 tablet 6  . tamsulosin (FLOMAX) 0.4 MG CAPS Take 0.4 mg by mouth daily after breakfast.     . fluticasone (FLONASE) 50 MCG/ACT nasal spray Place 2 sprays into both nostrils daily.    Marland Kitchen loratadine (CLARITIN) 10 MG tablet Take 1 tablet (10 mg total) by mouth daily as needed for allergies.     No current facility-administered medications for this visit.     BP 130/84   Pulse 61   Ht 5\' 7"  (1.702 m)   Wt 201 lb 12.8 oz (91.5 kg)   BMI 31.61 kg/m  General: NAD Neck: JVP  7 cm, no thyromegaly or thyroid nodule.  Lungs: Clear to auscultation bilaterally with normal respiratory effort. CV: Nondisplaced PMI.  Heart irregular S1/S2, +S4, no murmur.  No edema.  No carotid bruit.  Normal pedal pulses.  Abdomen: Soft, nontender, no hepatosplenomegaly, no distention. Colostomy in place.  Skin: Intact without lesions or rashes.  Neurologic: Alert and oriented x 3.  Psych: Normal affect. Extremities: No clubbing or cyanosis.   Assessment/Plan: 1. CAD: S/p CABG.  No chest pain or dyspnea but has generalized fatigue.  Echo showed EF mildly decreased at 45-50%.   - Given fall in EF and nonspecific symptoms, I will risk stratify with Lexiscan Cardiolite.  2.  Hyperlipidemia: Check lipids/LFTs today.  3. Chronic primarily diastolic CHF: Echo (0000000) with EF 45-50%.  At last appointment, he appeared volume overloaded and Lasix was started.  Volume status appears much better.   - Decrease Lasix to 20 mg every other day and KCl to 10 mEq every other day.  - BMET/BNP today.   4. Atrial fibrillation:  He does not feel the atrial fibrillation, not sure how long it has been present.  ?symptomatic given volume overload on exam at last appointment but he denies significant dyspnea. He failed cardioversion in 9/17 but is actually in NSR today.  He is not a good candidate for antiarrhythmics given baseline long QT and history of CAD.   - He thinks Xarelto is making him fatigued.  I encouraged him to continue this, but if he insists on stopping it, I will put him on warfarin (he was not able to take Eliquis due to itching).  - Does not appear to need a rate control medication.  5. Suspect sleep apnea: I encouraged him to get a sleep study but he is not interested at this time.   Followup in 1 month.   Loralie Champagne 11/19/2015

## 2015-11-20 DIAGNOSIS — J019 Acute sinusitis, unspecified: Secondary | ICD-10-CM | POA: Diagnosis not present

## 2015-11-25 ENCOUNTER — Encounter (HOSPITAL_COMMUNITY): Payer: Medicare Other

## 2015-11-25 ENCOUNTER — Ambulatory Visit (HOSPITAL_COMMUNITY): Payer: Medicare Other | Attending: Cardiovascular Disease

## 2015-11-25 DIAGNOSIS — R9439 Abnormal result of other cardiovascular function study: Secondary | ICD-10-CM | POA: Insufficient documentation

## 2015-11-25 DIAGNOSIS — I5021 Acute systolic (congestive) heart failure: Secondary | ICD-10-CM | POA: Diagnosis not present

## 2015-11-25 DIAGNOSIS — I2510 Atherosclerotic heart disease of native coronary artery without angina pectoris: Secondary | ICD-10-CM | POA: Diagnosis not present

## 2015-11-25 DIAGNOSIS — Z951 Presence of aortocoronary bypass graft: Secondary | ICD-10-CM | POA: Diagnosis not present

## 2015-11-25 DIAGNOSIS — R5383 Other fatigue: Secondary | ICD-10-CM | POA: Insufficient documentation

## 2015-11-25 DIAGNOSIS — I4891 Unspecified atrial fibrillation: Secondary | ICD-10-CM

## 2015-11-25 LAB — MYOCARDIAL PERFUSION IMAGING
LV dias vol: 133 mL (ref 62–150)
LV sys vol: 80 mL
Peak HR: 85 {beats}/min
RATE: 0.38
Rest HR: 57 {beats}/min
SDS: 2
SRS: 10
SSS: 12
TID: 0.9

## 2015-11-25 MED ORDER — TECHNETIUM TC 99M TETROFOSMIN IV KIT
32.9000 | PACK | Freq: Once | INTRAVENOUS | Status: AC | PRN
  Administered 2015-11-25: 32.9 via INTRAVENOUS
  Filled 2015-11-25: qty 33

## 2015-11-25 MED ORDER — TECHNETIUM TC 99M TETROFOSMIN IV KIT
10.5000 | PACK | Freq: Once | INTRAVENOUS | Status: AC | PRN
  Administered 2015-11-25: 10.5 via INTRAVENOUS
  Filled 2015-11-25: qty 11

## 2015-11-25 MED ORDER — REGADENOSON 0.4 MG/5ML IV SOLN
0.4000 mg | Freq: Once | INTRAVENOUS | Status: AC
  Administered 2015-11-25: 0.4 mg via INTRAVENOUS

## 2015-12-01 DIAGNOSIS — I4891 Unspecified atrial fibrillation: Secondary | ICD-10-CM | POA: Diagnosis not present

## 2015-12-01 DIAGNOSIS — Z6832 Body mass index (BMI) 32.0-32.9, adult: Secondary | ICD-10-CM | POA: Diagnosis not present

## 2015-12-01 DIAGNOSIS — J449 Chronic obstructive pulmonary disease, unspecified: Secondary | ICD-10-CM | POA: Diagnosis not present

## 2015-12-01 DIAGNOSIS — F419 Anxiety disorder, unspecified: Secondary | ICD-10-CM | POA: Diagnosis not present

## 2015-12-01 DIAGNOSIS — C20 Malignant neoplasm of rectum: Secondary | ICD-10-CM | POA: Diagnosis not present

## 2015-12-01 DIAGNOSIS — Z23 Encounter for immunization: Secondary | ICD-10-CM | POA: Diagnosis not present

## 2015-12-01 DIAGNOSIS — Z1389 Encounter for screening for other disorder: Secondary | ICD-10-CM | POA: Diagnosis not present

## 2015-12-04 ENCOUNTER — Telehealth: Payer: Self-pay | Admitting: Cardiology

## 2015-12-04 NOTE — Telephone Encounter (Signed)
I had called pt to arrange follow up appt with Dr Aundra Dubin-- Wife advised appt scheduled with Dr Aundra Dubin 01/14/16 at 2:15PM

## 2015-12-04 NOTE — Telephone Encounter (Signed)
New message   Pt verbalized that he is returning call for rn about appt for Dr.McLean

## 2015-12-07 DIAGNOSIS — Z85048 Personal history of other malignant neoplasm of rectum, rectosigmoid junction, and anus: Secondary | ICD-10-CM | POA: Diagnosis not present

## 2016-01-13 ENCOUNTER — Encounter: Payer: Self-pay | Admitting: Cardiology

## 2016-01-14 ENCOUNTER — Ambulatory Visit (INDEPENDENT_AMBULATORY_CARE_PROVIDER_SITE_OTHER): Payer: Medicare Other | Admitting: Cardiology

## 2016-01-14 ENCOUNTER — Encounter: Payer: Self-pay | Admitting: Cardiology

## 2016-01-14 VITALS — BP 120/60 | HR 80 | Ht 67.0 in | Wt 205.0 lb

## 2016-01-14 DIAGNOSIS — I4891 Unspecified atrial fibrillation: Secondary | ICD-10-CM

## 2016-01-14 DIAGNOSIS — I25810 Atherosclerosis of coronary artery bypass graft(s) without angina pectoris: Secondary | ICD-10-CM

## 2016-01-14 DIAGNOSIS — I5021 Acute systolic (congestive) heart failure: Secondary | ICD-10-CM | POA: Diagnosis not present

## 2016-01-14 LAB — CBC WITH DIFFERENTIAL/PLATELET
BASOS PCT: 0 %
Basophils Absolute: 0 cells/uL (ref 0–200)
EOS ABS: 304 {cells}/uL (ref 15–500)
Eosinophils Relative: 4 %
HCT: 41.6 % (ref 38.5–50.0)
Hemoglobin: 14.1 g/dL (ref 13.2–17.1)
LYMPHS PCT: 27 %
Lymphs Abs: 2052 cells/uL (ref 850–3900)
MCH: 30.8 pg (ref 27.0–33.0)
MCHC: 33.9 g/dL (ref 32.0–36.0)
MCV: 90.8 fL (ref 80.0–100.0)
MONOS PCT: 9 %
MPV: 9.8 fL (ref 7.5–12.5)
Monocytes Absolute: 684 cells/uL (ref 200–950)
Neutro Abs: 4560 cells/uL (ref 1500–7800)
Neutrophils Relative %: 60 %
PLATELETS: 154 10*3/uL (ref 140–400)
RBC: 4.58 MIL/uL (ref 4.20–5.80)
RDW: 15.1 % — AB (ref 11.0–15.0)
WBC: 7.6 10*3/uL (ref 3.8–10.8)

## 2016-01-14 MED ORDER — CARVEDILOL 3.125 MG PO TABS: 3.1250 mg | ORAL_TABLET | Freq: Two times a day (BID) | ORAL | 1 refills | Status: DC

## 2016-01-14 MED ORDER — POTASSIUM CHLORIDE ER 10 MEQ PO TBCR: 10.0000 meq | EXTENDED_RELEASE_TABLET | ORAL | 1 refills | Status: DC

## 2016-01-14 MED ORDER — FUROSEMIDE 20 MG PO TABS: 20.0000 mg | ORAL_TABLET | ORAL | 1 refills | Status: DC

## 2016-01-14 NOTE — Patient Instructions (Signed)
Medication Instructions:  Start coreg(carvedilol) 3.125mg  two times a day  Labwork: Lipid profile/CBCd today  Testing/Procedures: none  Follow-Up: Your physician recommends that you schedule a follow-up appointment in: 3 months with Dr Aundra Dubin in the [facility].   Code for March is 5002   Any Other Special Instructions Will Be Listed Below (If Applicable).  Check your blood pressure several times a week. If the systolic (top number) in regularly less than 90 call our office, [phone].   If you need a refill on your cardiac medications before your next appointment, please call your pharmacy.

## 2016-01-14 NOTE — Progress Notes (Signed)
PCP: Dr. Orson Ape  70 yo with history of CAD s/p CABG and rectal cancer s/p abdominal perineal resection in 5/14 presents for cardiology followup.  He had CABG x 6 in 2005.  Stress tests later in 2005 and in 2008 were normal.  As above, he had APR in 5/14.  He had a complicated colostomy revision in 12/14.  After this operation, he developed abdominal wall cellulitis and septic shock, also in 12/14.  He was positive for influenza also.  His colostomy will be permanent. He had moved to [facility] but has returned to live in [place].   At a prior appointment, he was noted to be in atrial fibrillation with rate control.  He did not realize that he was in atrial fibrillation.  Holter monitor showed persistent AF.  He looked volume overloaded on exam and Lasix was started.  Echo in 8/17 showed EF 45-50%, diffuse hypokinesis.  He underwent DCCV 9/17 but did not hold NSR.  Lexiscan Cardiolite in 10/17 showed EF 40%, lateral fixed defect, no ischemia.   He does not feel palpitations.  No chest pain, no exertional dyspnea.  No BRBPR or melena.      ECG: atrial fibrillation at 84, nonspecific T wave flattening  Labs (3/14): LDL 83, HDL 64, TGs 203 Labs (4/14): K 3.4, creatinine 0.77 Labs (6/14): LDL 63, HDL 41 Labs (5/15): creatinine 0.78 Labs (8/17): LDL 63, HDL 52 Labs (9/17): TSH normal, K 4, creatinine 0.87 Labs (10/17): K 4.2, creatinine 0.77, BNP 108.5  PMH: 1. Rectal cancer: Abdominal perineal resection in 5/14, has permanent colostomy.  2. CAD: CABG in 2005 with LIMA-LAD, SVG-D, seq SVG-OM1/dLCx, seq SVG-PDA/PLV.  Stress myoviews in 11/05 and in 2008 were normal. 3. Hyperlipidemia 4. Atrial fibrillation: First noted 8/17.  - DCCV 9/17: Converted to NSR but went back into atrial fibrillation before leaving hospital.  5. Chronic systolic CHF: Ischemic cardiomyopathy.  - Echo (8/17) with EF 45-50%, mild LVH, mildly dilated LV.   - Lexiscan Cardiolite (10/17): EF 40%, no ischemia, lateral  fixed defect.   SH: Lives in [place] with wife, quit smoking 2004, continues moderate ETOH intake, retired from Research officer, trade union and Production manager (Engineer, production).    FH: CAD (brother), CVA (father), CHF (mother).   ROS: All systems were reviewed and negative except as per HPI.   Current Outpatient Prescriptions  Medication Sig Dispense Refill  . ALPRAZolam (XANAX) 1 MG tablet Take 1 mg by mouth 4 (four) times daily as needed for anxiety or sleep.     Marland Kitchen atorvastatin (LIPITOR) 40 MG tablet TAKE 1 TABLET BY MOUTH DAILY 30 tablet 4  . fluticasone (FLONASE) 50 MCG/ACT nasal spray Place 2 sprays into both nostrils daily.    . furosemide (LASIX) 20 MG tablet Take 1 tablet (20 mg total) by mouth every other day. 45 tablet 1  . loratadine (CLARITIN) 10 MG tablet Take 1 tablet (10 mg total) by mouth daily as needed for allergies.    . potassium chloride (KLOR-CON 10) 10 MEQ tablet Take 1 tablet (10 mEq total) by mouth every other day. 45 tablet 1  . rivaroxaban (XARELTO) 20 MG TABS tablet Take 1 tablet (20 mg total) by mouth daily with supper. 30 tablet 6  . tamsulosin (FLOMAX) 0.4 MG CAPS Take 0.4 mg by mouth daily after breakfast.     . carvedilol (COREG) 3.125 MG tablet Take 1 tablet (3.125 mg total) by mouth 2 (two) times daily. 180 tablet 1   No current facility-administered medications for this visit.  BP 120/60   Pulse 80   Ht 5\' 7"  (1.702 m)   Wt 205 lb (93 kg)   BMI 32.11 kg/m  General: NAD Neck: JVP 7 cm, no thyromegaly or thyroid nodule.  Lungs: Clear to auscultation bilaterally with normal respiratory effort. CV: Nondisplaced PMI.  Heart irregular S1/S2, no murmur.  No edema.  No carotid bruit.  Normal pedal pulses.  Abdomen: Soft, nontender, no hepatosplenomegaly, no distention. Colostomy in place.  Skin: Intact without lesions or rashes.  Neurologic: Alert and oriented x 3.  Psych: Normal affect. Extremities: No clubbing or cyanosis.   Assessment/Plan: 1. CAD: S/p CABG.  No chest  pain or dyspnea but has generalized fatigue.  Cardiolite with prior lateral MI but no ischemia.  - No ASA given stable CAD and Xarelto use.  - Continue statin.  2. Hyperlipidemia: Check lipids today.  3. Chronic systolic CHF: Echo (0000000) with EF 45-50%, Cardiolite 10/17 with EF 40%.  At a prior appointment, he appeared volume overloaded and Lasix was started.  Volume status appears much better now, NYHA class II.   - Continue Lasix 20 mg every other day and KCl to 10 mEq every other day.  - Add low dose Coreg 3.125 mg bid.  If he tolerates this, will add lisinopril 2.5 daily.  BP may be a limiting factor.   4. Atrial fibrillation:  He does not feel the atrial fibrillation, not sure how long it has been present.  He failed cardioversion in 9/17.  He is not a good candidate for antiarrhythmics given baseline long QT and history of CAD.    - Does not appear to need a rate control medication.  - Continue Xarelto, check CBC.  5. Suspect sleep apnea: I encouraged him to get a sleep study but he is not interested at this time.   Followup in 3 months with [facility].   Loralie Champagne 01/14/2016

## 2016-01-15 LAB — LIPID PANEL
CHOL/HDL RATIO: 2.7 ratio (ref <5.0)
CHOLESTEROL: 134 mg/dL (ref <200)
HDL: 50 mg/dL (ref >40)
LDL CALC: 68 mg/dL (ref <100)
Triglycerides: 81 mg/dL (ref <150)
VLDL: 16 mg/dL (ref <30)

## 2016-04-04 ENCOUNTER — Other Ambulatory Visit: Payer: Self-pay | Admitting: Cardiology

## 2016-04-04 DIAGNOSIS — I5021 Acute systolic (congestive) heart failure: Secondary | ICD-10-CM

## 2016-04-04 DIAGNOSIS — I4891 Unspecified atrial fibrillation: Secondary | ICD-10-CM

## 2016-04-05 ENCOUNTER — Other Ambulatory Visit: Payer: Self-pay | Admitting: *Deleted

## 2016-04-05 DIAGNOSIS — I5021 Acute systolic (congestive) heart failure: Secondary | ICD-10-CM

## 2016-04-05 DIAGNOSIS — I4891 Unspecified atrial fibrillation: Secondary | ICD-10-CM

## 2016-04-05 MED ORDER — FUROSEMIDE 20 MG PO TABS: 20.0000 mg | ORAL_TABLET | ORAL | 2 refills | Status: DC

## 2016-04-14 ENCOUNTER — Encounter (HOSPITAL_COMMUNITY): Payer: Self-pay

## 2016-04-14 ENCOUNTER — Ambulatory Visit (HOSPITAL_COMMUNITY)
Admission: RE | Admit: 2016-04-14 | Discharge: 2016-04-14 | Disposition: A | Discharge status: Home / Self Care | Payer: Medicare Other | Source: Ambulatory Visit | Attending: Cardiology | Admitting: Cardiology

## 2016-04-14 VITALS — BP 106/74 | HR 96 | Wt 210.0 lb

## 2016-04-14 DIAGNOSIS — I5022 Chronic systolic (congestive) heart failure: Secondary | ICD-10-CM | POA: Diagnosis not present

## 2016-04-14 DIAGNOSIS — I4891 Unspecified atrial fibrillation: Secondary | ICD-10-CM

## 2016-04-14 DIAGNOSIS — Z933 Colostomy status: Secondary | ICD-10-CM | POA: Insufficient documentation

## 2016-04-14 DIAGNOSIS — Z7901 Long term (current) use of anticoagulants: Secondary | ICD-10-CM | POA: Insufficient documentation

## 2016-04-14 DIAGNOSIS — I25810 Atherosclerosis of coronary artery bypass graft(s) without angina pectoris: Secondary | ICD-10-CM | POA: Diagnosis not present

## 2016-04-14 DIAGNOSIS — Z9889 Other specified postprocedural states: Secondary | ICD-10-CM | POA: Insufficient documentation

## 2016-04-14 DIAGNOSIS — I255 Ischemic cardiomyopathy: Secondary | ICD-10-CM | POA: Diagnosis not present

## 2016-04-14 DIAGNOSIS — Z8249 Family history of ischemic heart disease and other diseases of the circulatory system: Secondary | ICD-10-CM | POA: Diagnosis not present

## 2016-04-14 DIAGNOSIS — Z85048 Personal history of other malignant neoplasm of rectum, rectosigmoid junction, and anus: Secondary | ICD-10-CM | POA: Diagnosis not present

## 2016-04-14 DIAGNOSIS — I252 Old myocardial infarction: Secondary | ICD-10-CM | POA: Diagnosis not present

## 2016-04-14 DIAGNOSIS — I25700 Atherosclerosis of coronary artery bypass graft(s), unspecified, with unstable angina pectoris: Secondary | ICD-10-CM

## 2016-04-14 DIAGNOSIS — E785 Hyperlipidemia, unspecified: Secondary | ICD-10-CM | POA: Diagnosis not present

## 2016-04-14 DIAGNOSIS — Z951 Presence of aortocoronary bypass graft: Secondary | ICD-10-CM | POA: Diagnosis not present

## 2016-04-14 DIAGNOSIS — Z87891 Personal history of nicotine dependence: Secondary | ICD-10-CM | POA: Insufficient documentation

## 2016-04-14 DIAGNOSIS — I2510 Atherosclerotic heart disease of native coronary artery without angina pectoris: Secondary | ICD-10-CM | POA: Insufficient documentation

## 2016-04-14 MED ORDER — LISINOPRIL 2.5 MG PO TABS: 2.5000 mg | ORAL_TABLET | Freq: Every day | ORAL | 6 refills | Status: DC

## 2016-04-14 MED ORDER — RIVAROXABAN 20 MG PO TABS: 20.0000 mg | ORAL_TABLET | Freq: Every day | ORAL | 6 refills | Status: DC

## 2016-04-14 MED ORDER — HYDROXYZINE HCL 25 MG PO TABS: 25.0000 mg | ORAL_TABLET | Freq: Two times a day (BID) | ORAL | 6 refills | Status: DC | PRN

## 2016-04-14 NOTE — Patient Instructions (Signed)
Start Lisinopril 2.5 mg daily  Start Atarax 25 mg Twice daily AS NEEDED for itching  Labs in 2 weeks  We will contact you in 4 months to schedule your next appointment.

## 2016-04-16 NOTE — Progress Notes (Signed)
PCP: Dr. Orson Ape  71 yo with history of CAD s/p CABG and rectal cancer s/p abdominal perineal resection in 5/14 presents for cardiology followup.  He had CABG x 6 in 2005.  Stress tests later in 2005 and in 2008 were normal.  As above, he had APR in 5/14.  He had a complicated colostomy revision in 12/14.  After this operation, he developed abdominal wall cellulitis and septic shock, also in 12/14.  He was positive for influenza also.  His colostomy will be permanent. He had moved to [facility] but has returned to live in [place].   At a prior appointment, he was noted to be in atrial fibrillation with rate control.  He did not realize that he was in atrial fibrillation.  Holter monitor showed persistent AF.  He looked volume overloaded on exam and Lasix was started.  Echo in 8/17 showed EF 45-50%, diffuse hypokinesis.  He underwent DCCV 9/17 but did not hold NSR.  Lexiscan Cardiolite in 10/17 showed EF 40%, lateral fixed defect, no ischemia.   He does not feel palpitations.  No chest pain, no exertional dyspnea.  No BRBPR or melena.  Weight is up a bit but he has not been very active over the winter.  He has some itching that he attributes to Xarelto but it is manageable and he does not want to change medication.    Labs (3/14): LDL 83, HDL 64, TGs 203 Labs (4/14): K 3.4, creatinine 0.77 Labs (6/14): LDL 63, HDL 41 Labs (5/15): creatinine 0.78 Labs (8/17): LDL 63, HDL 52 Labs (9/17): TSH normal, K 4, creatinine 0.87 Labs (10/17): K 4.2, creatinine 0.77, BNP 108.5 Labs (12/17): HCT 41.6, LDL 68, HDL 50  PMH: 1. Rectal cancer: Abdominal perineal resection in 5/14, has permanent colostomy.  2. CAD: CABG in 2005 with LIMA-LAD, SVG-D, seq SVG-OM1/dLCx, seq SVG-PDA/PLV.  Stress myoviews in 11/05 and in 2008 were normal. 3. Hyperlipidemia 4. Atrial fibrillation: First noted 8/17.  - DCCV 9/17: Converted to NSR but went back into atrial fibrillation before leaving hospital.  5. Chronic systolic  CHF: Ischemic cardiomyopathy.  - Echo (8/17) with EF 45-50%, mild LVH, mildly dilated LV.   - Lexiscan Cardiolite (10/17): EF 40%, no ischemia, lateral fixed defect.   SH: Lives in [place] with wife, quit smoking 2004, continues moderate ETOH intake, retired from Research officer, trade union and Production manager (Engineer, production).    FH: CAD (brother), CVA (father), CHF (mother).   ROS: All systems were reviewed and negative except as per HPI.   Current Outpatient Prescriptions  Medication Sig Dispense Refill  . ALPRAZolam (XANAX) 1 MG tablet Take 1 mg by mouth 4 (four) times daily as needed for anxiety or sleep.     Marland Kitchen atorvastatin (LIPITOR) 40 MG tablet TAKE 1 TABLET BY MOUTH DAILY 30 tablet 4  . carvedilol (COREG) 3.125 MG tablet Take 1 tablet (3.125 mg total) by mouth 2 (two) times daily. 180 tablet 1  . fluticasone (FLONASE) 50 MCG/ACT nasal spray Place 2 sprays into both nostrils daily.    . furosemide (LASIX) 20 MG tablet Take 1 tablet (20 mg total) by mouth every other day. 45 tablet 2  . potassium chloride (K-DUR,KLOR-CON) 10 MEQ tablet TAKE ONE TABLET BY MOUTH EVERY OTHER DAY 45 tablet 2  . rivaroxaban (XARELTO) 20 MG TABS tablet Take 1 tablet (20 mg total) by mouth daily with supper. 30 tablet 6  . tamsulosin (FLOMAX) 0.4 MG CAPS Take 0.4 mg by mouth daily after breakfast.     .  hydrOXYzine (ATARAX/VISTARIL) 25 MG tablet Take 1 tablet (25 mg total) by mouth 2 (two) times daily as needed for itching. 60 tablet 6  . lisinopril (PRINIVIL,ZESTRIL) 2.5 MG tablet Take 1 tablet (2.5 mg total) by mouth daily. 30 tablet 6  . loratadine (CLARITIN) 10 MG tablet Take 1 tablet (10 mg total) by mouth daily as needed for allergies.     No current facility-administered medications for this encounter.     BP 106/74   Pulse 96   Wt 210 lb (95.3 kg)   SpO2 98%   BMI 32.89 kg/m  General: NAD Neck: No JVD, no thyromegaly or thyroid nodule.  Lungs: Clear to auscultation bilaterally with normal respiratory effort. CV:  Nondisplaced PMI.  Heart irregular S1/S2, no S3/S4, no murmur.  No edema.  No carotid bruit.  Normal pedal pulses.  Abdomen: Soft, nontender, no hepatosplenomegaly, no distention. Colostomy in place.  Skin: Intact without lesions or rashes.  Neurologic: Alert and oriented x 3.  Psych: Normal affect. Extremities: No clubbing or cyanosis.   Assessment/Plan: 1. CAD: S/p CABG.  No chest pain or dyspnea but has generalized fatigue.  Cardiolite with prior lateral MI but no ischemia.  - No ASA given stable CAD and Xarelto use.  - Continue statin.  2. Hyperlipidemia: Good lipids 12/17.  3. Chronic systolic CHF: Echo (2/92) with EF 45-50%, Cardiolite 10/17 with EF 40%.  At a prior appointment, he appeared volume overloaded and Lasix was started.  Volume status appears much better now, NYHA class II.   - Continue Lasix 20 mg every other day and KCl to 10 mEq every other day.  - Continue Coreg 3.125 mg bid. - I will add lisinopril 2.5 mg daily with BMET in 2 wks.  4. Atrial fibrillation:  He does not feel the atrial fibrillation, not sure how long it has been present.  He failed cardioversion in 9/17.  He is not a good candidate for antiarrhythmics given baseline long QT and history of CAD.    - Does not appear to need a rate control medication.  - Continue Xarelto  5. Suspect sleep apnea: I have encouraged him to get a sleep study but he is not interested.  Followup in 4 months   Loralie Champagne 04/16/2016

## 2016-04-18 ENCOUNTER — Other Ambulatory Visit: Payer: Self-pay | Admitting: Cardiology

## 2016-04-18 DIAGNOSIS — I25810 Atherosclerosis of coronary artery bypass graft(s) without angina pectoris: Secondary | ICD-10-CM

## 2016-04-28 ENCOUNTER — Ambulatory Visit (HOSPITAL_COMMUNITY)
Admission: RE | Admit: 2016-04-28 | Discharge: 2016-04-28 | Disposition: A | Discharge status: Home / Self Care | Payer: Medicare Other | Source: Ambulatory Visit | Attending: Cardiology | Admitting: Cardiology

## 2016-04-28 DIAGNOSIS — I25700 Atherosclerosis of coronary artery bypass graft(s), unspecified, with unstable angina pectoris: Secondary | ICD-10-CM | POA: Insufficient documentation

## 2016-04-28 LAB — BASIC METABOLIC PANEL
ANION GAP: 7 (ref 5–15)
BUN: 15 mg/dL (ref 6–20)
CHLORIDE: 103 mmol/L (ref 101–111)
CO2: 29 mmol/L (ref 22–32)
CREATININE: 0.88 mg/dL (ref 0.61–1.24)
Calcium: 8.9 mg/dL (ref 8.9–10.3)
GFR calc non Af Amer: >60 mL/min (ref >60)
Glucose, Bld: 118 mg/dL — ABNORMAL HIGH (ref 65–99)
POTASSIUM: 3.6 mmol/L (ref 3.5–5.1)
SODIUM: 139 mmol/L (ref 135–145)

## 2016-06-07 ENCOUNTER — Encounter (INDEPENDENT_AMBULATORY_CARE_PROVIDER_SITE_OTHER): Payer: Self-pay | Admitting: *Deleted

## 2016-06-07 DIAGNOSIS — J019 Acute sinusitis, unspecified: Secondary | ICD-10-CM | POA: Diagnosis not present

## 2016-06-14 ENCOUNTER — Other Ambulatory Visit: Payer: Self-pay | Admitting: General Surgery

## 2016-06-14 DIAGNOSIS — Z85048 Personal history of other malignant neoplasm of rectum, rectosigmoid junction, and anus: Secondary | ICD-10-CM | POA: Diagnosis not present

## 2016-06-14 DIAGNOSIS — K469 Unspecified abdominal hernia without obstruction or gangrene: Secondary | ICD-10-CM | POA: Diagnosis not present

## 2016-06-17 ENCOUNTER — Ambulatory Visit
Admission: RE | Admit: 2016-06-17 | Discharge: 2016-06-17 | Disposition: A | Discharge status: Home / Self Care | Payer: Medicare Other | Source: Ambulatory Visit | Attending: General Surgery | Admitting: General Surgery

## 2016-06-17 DIAGNOSIS — Z85048 Personal history of other malignant neoplasm of rectum, rectosigmoid junction, and anus: Secondary | ICD-10-CM

## 2016-06-17 DIAGNOSIS — I700 Atherosclerosis of aorta: Secondary | ICD-10-CM | POA: Diagnosis not present

## 2016-06-17 DIAGNOSIS — K435 Parastomal hernia without obstruction or  gangrene: Secondary | ICD-10-CM | POA: Diagnosis not present

## 2016-06-17 MED ORDER — IOPAMIDOL (ISOVUE-300) INJECTION 61%
125.0000 mL | Freq: Once | INTRAVENOUS | Status: AC | PRN
  Administered 2016-06-17: 125 mL via INTRAVENOUS

## 2016-07-21 ENCOUNTER — Telehealth (HOSPITAL_COMMUNITY): Payer: Self-pay

## 2016-07-21 DIAGNOSIS — I2510 Atherosclerotic heart disease of native coronary artery without angina pectoris: Secondary | ICD-10-CM

## 2016-07-21 MED ORDER — LISINOPRIL 2.5 MG PO TABS: 1.2500 mg | ORAL_TABLET | Freq: Every day | ORAL | 3 refills | Status: DC

## 2016-07-21 NOTE — Telephone Encounter (Signed)
Patient reports dizziness, orthostasis, and low BP of 85/50 past few days.  Denies any recent changes to medicatiosn past 2 weeks. Does state he has lost 9-10 lbs past 2 weeks however he has tried doing low carb low sugar diet to lose weight. Denies any other s/s. Advised to hold Lisinopril and coreg at this time and will forward to provider Dr. Aundra Dubin.  Renee Pain, RN

## 2016-07-21 NOTE — Telephone Encounter (Signed)
Called patient back and he is agreeable with plan.  I have updated medication changes as discusses, ordered Bmet for tomorrow, and made a f/u appointment for July 2nd.  Patient will call us Monday if he is still having low blood pressure.

## 2016-07-21 NOTE — Telephone Encounter (Signed)
With the weight loss and symptoms, stop Lasix and KCl.  Needs to get BMET.  Would hold Coreg and lisinopril today.  Restart on Coreg 3.125 mg bid tomorrow.  Restart lisinopril 1.25 mg (1/2 of his 2.5 mg tab) in the evening on Saturday. Call Monday to tell us how he is doing. Get appt with APP.

## 2016-07-22 ENCOUNTER — Ambulatory Visit (HOSPITAL_COMMUNITY)
Admission: RE | Admit: 2016-07-22 | Discharge: 2016-07-22 | Disposition: A | Discharge status: Home / Self Care | Payer: Medicare Other | Source: Ambulatory Visit | Attending: Cardiology | Admitting: Cardiology

## 2016-07-22 DIAGNOSIS — I2510 Atherosclerotic heart disease of native coronary artery without angina pectoris: Secondary | ICD-10-CM | POA: Insufficient documentation

## 2016-07-22 LAB — BASIC METABOLIC PANEL
ANION GAP: 8 (ref 5–15)
BUN: 19 mg/dL (ref 6–20)
CALCIUM: 9 mg/dL (ref 8.9–10.3)
CO2: 26 mmol/L (ref 22–32)
CREATININE: 1.06 mg/dL (ref 0.61–1.24)
Chloride: 100 mmol/L — ABNORMAL LOW (ref 101–111)
GLUCOSE: 106 mg/dL — AB (ref 65–99)
Potassium: 3.9 mmol/L (ref 3.5–5.1)
Sodium: 134 mmol/L — ABNORMAL LOW (ref 135–145)

## 2016-07-25 DIAGNOSIS — Z01818 Encounter for other preprocedural examination: Secondary | ICD-10-CM | POA: Diagnosis not present

## 2016-07-25 DIAGNOSIS — K435 Parastomal hernia without obstruction or  gangrene: Secondary | ICD-10-CM | POA: Diagnosis not present

## 2016-07-26 ENCOUNTER — Ambulatory Visit: Payer: Self-pay | Admitting: Surgery

## 2016-08-01 ENCOUNTER — Encounter (HOSPITAL_COMMUNITY): Payer: Self-pay

## 2016-08-01 ENCOUNTER — Ambulatory Visit (HOSPITAL_COMMUNITY)
Admission: RE | Admit: 2016-08-01 | Discharge: 2016-08-01 | Disposition: A | Discharge status: Home / Self Care | Payer: Medicare Other | Source: Ambulatory Visit | Attending: Cardiology | Admitting: Cardiology

## 2016-08-01 VITALS — BP 96/60 | HR 53 | Wt 193.8 lb

## 2016-08-01 DIAGNOSIS — Z87891 Personal history of nicotine dependence: Secondary | ICD-10-CM | POA: Insufficient documentation

## 2016-08-01 DIAGNOSIS — Z8249 Family history of ischemic heart disease and other diseases of the circulatory system: Secondary | ICD-10-CM | POA: Diagnosis not present

## 2016-08-01 DIAGNOSIS — Z823 Family history of stroke: Secondary | ICD-10-CM | POA: Insufficient documentation

## 2016-08-01 DIAGNOSIS — Z79899 Other long term (current) drug therapy: Secondary | ICD-10-CM | POA: Diagnosis not present

## 2016-08-01 DIAGNOSIS — I4821 Permanent atrial fibrillation: Secondary | ICD-10-CM

## 2016-08-01 DIAGNOSIS — I428 Other cardiomyopathies: Secondary | ICD-10-CM | POA: Diagnosis not present

## 2016-08-01 DIAGNOSIS — I255 Ischemic cardiomyopathy: Secondary | ICD-10-CM | POA: Insufficient documentation

## 2016-08-01 DIAGNOSIS — I482 Chronic atrial fibrillation: Secondary | ICD-10-CM | POA: Insufficient documentation

## 2016-08-01 DIAGNOSIS — I25700 Atherosclerosis of coronary artery bypass graft(s), unspecified, with unstable angina pectoris: Secondary | ICD-10-CM | POA: Diagnosis not present

## 2016-08-01 DIAGNOSIS — E785 Hyperlipidemia, unspecified: Secondary | ICD-10-CM | POA: Insufficient documentation

## 2016-08-01 DIAGNOSIS — Z7901 Long term (current) use of anticoagulants: Secondary | ICD-10-CM | POA: Diagnosis not present

## 2016-08-01 DIAGNOSIS — I5022 Chronic systolic (congestive) heart failure: Secondary | ICD-10-CM | POA: Insufficient documentation

## 2016-08-01 DIAGNOSIS — Z85048 Personal history of other malignant neoplasm of rectum, rectosigmoid junction, and anus: Secondary | ICD-10-CM | POA: Diagnosis not present

## 2016-08-01 DIAGNOSIS — Z951 Presence of aortocoronary bypass graft: Secondary | ICD-10-CM | POA: Insufficient documentation

## 2016-08-01 DIAGNOSIS — I2510 Atherosclerotic heart disease of native coronary artery without angina pectoris: Secondary | ICD-10-CM | POA: Diagnosis not present

## 2016-08-01 DIAGNOSIS — Z933 Colostomy status: Secondary | ICD-10-CM | POA: Insufficient documentation

## 2016-08-01 NOTE — Progress Notes (Signed)
PCP: Dr. Orson Ape  71 yo with history of CAD s/p CABG and rectal cancer s/p abdominal perineal resection in 5/14 presents for cardiology followup.  He had CABG x 6 in 2005.  Stress tests later in 2005 and in 2008 were normal.  As above, he had APR in 5/14.  He had a complicated colostomy revision in 12/14.  After this operation, he developed abdominal wall cellulitis and septic shock, also in 12/14.  He was positive for influenza also.  His colostomy will be permanent. He had moved to [facility] but has returned to live in [place].   At a prior appointment, he was noted to be in atrial fibrillation with rate control.  He did not realize that he was in atrial fibrillation.  Holter monitor showed persistent AF.  He looked volume overloaded on exam and Lasix was started.  Echo in 8/17 showed EF 45-50%, diffuse hypokinesis.  He underwent DCCV 9/17 but did not hold NSR.  Lexiscan Cardiolite in 10/17 showed EF 40%, lateral fixed defect, no ischemia.   He returns today for HF follow up. Last week, he had hypotension with BP's in the 80's/50's range. He felt dizzy, had no energy, also had some presyncope. He called our office and his lasix was stopped along with his Kcl supplementation. His Coreg and Lisinopril were held for 2 days. He started the [employer] about a month ago and has lost 13 pounds, He is feeling much better since stopping the Lasix. BP at home has improved, this morning BP was 101/55. He denies dizziness, presyncope now. His weight this morning was 191 pounds, last weight in March at our office was 210 pounds. He is following a low sodium diet, very strict about his sodium intake. He is drinking more than 2L a day some days, working outside a lot in the heat. He denies palpitations, chest pain and SOB. No SOB with working in the yard or walking around the grocery store. He denies melena and hematochezia.    Labs (3/14): LDL 83, HDL 64, TGs 203 Labs (4/14): K 3.4, creatinine  0.77 Labs (6/14): LDL 63, HDL 41 Labs (5/15): creatinine 0.78 Labs (8/17): LDL 63, HDL 52 Labs (9/17): TSH normal, K 4, creatinine 0.87 Labs (10/17): K 4.2, creatinine 0.77, BNP 108.5 Labs (12/17): HCT 41.6, LDL 68, HDL 50  PMH: 1. Rectal cancer: Abdominal perineal resection in 5/14, has permanent colostomy.  2. CAD: CABG in 2005 with LIMA-LAD, SVG-D, seq SVG-OM1/dLCx, seq SVG-PDA/PLV.  Stress myoviews in 11/05 and in 2008 were normal. 3. Hyperlipidemia 4. Atrial fibrillation: First noted 8/17.  - DCCV 9/17: Converted to NSR but went back into atrial fibrillation before leaving hospital.  5. Chronic systolic CHF: Ischemic cardiomyopathy.  - Echo (8/17) with EF 45-50%, mild LVH, mildly dilated LV.   - Lexiscan Cardiolite (10/17): EF 40%, no ischemia, lateral fixed defect.   SH: Lives in [place] with wife, quit smoking 2004, continues moderate ETOH intake, retired from Research officer, trade union and Production manager (Engineer, production).    FH: CAD (brother), CVA (father), CHF (mother).   ROS: All systems were reviewed and negative except as per HPI.   Current Outpatient Prescriptions  Medication Sig Dispense Refill  . ALPRAZolam (XANAX) 1 MG tablet Take 1 mg by mouth 4 (four) times daily as needed for anxiety or sleep.     Marland Kitchen atorvastatin (LIPITOR) 40 MG tablet TAKE 1 TABLET BY MOUTH DAILY 30 tablet 4  . carvedilol (COREG) 3.125 MG tablet  Take 1 tablet (3.125 mg total) by mouth 2 (two) times daily. 180 tablet 3  . fluticasone (FLONASE) 50 MCG/ACT nasal spray Place 2 sprays into both nostrils daily.    . hydrOXYzine (ATARAX/VISTARIL) 25 MG tablet Take 1 tablet (25 mg total) by mouth 2 (two) times daily as needed for itching. 60 tablet 6  . lisinopril (PRINIVIL,ZESTRIL) 2.5 MG tablet Take 0.5 tablets (1.25 mg total) by mouth at bedtime. 15 tablet 3  . loratadine (CLARITIN) 10 MG tablet Take 1 tablet (10 mg total) by mouth daily as needed for allergies.    . rivaroxaban (XARELTO) 20 MG TABS tablet Take 1 tablet (20  mg total) by mouth daily with supper. 30 tablet 6  . tamsulosin (FLOMAX) 0.4 MG CAPS Take 0.4 mg by mouth daily after breakfast.      No current facility-administered medications for this encounter.     BP 96/60   Pulse (!) 53   Wt 193 lb 12.8 oz (87.9 kg)   SpO2 98%   BMI 30.35 kg/m   General: Well appearing male, NAD. Walked into clinic without difficulty.  Neck: No JVD. Supple.  Lungs: Clear bilaterally, normal effort.  CV: Nondisplaced PMI. Heart rate irregularly irregular. No s3. No peripheral edema.  No carotid bruit.  Normal pedal pulses.  Abdomen: Soft, nontender, no hepatosplenomegaly, no distention. Colostomy in place.   Skin: Intact without lesions or rashes.  Neurologic: Alert and oriented x 3.   Psych: Normal affect. Extremities: No clubbing or cyanosis.   Assessment/Plan: 1. CAD: S/p CABG in 2005 - Denies chest pain. No signs or symptoms of ischemia. - No ASA with Xarelto.  - Continue 40mg  atoravastatin. LDL 68 in Dec. 2017  - Had [place] with lateral MI, but no ischemia.  2. Hyperlipidemia:  - As above, continue atorvastatin 40mg  daily.  - Denies myalgias.  3. Chronic systolic CHF: Mixed ICM/NICM (previous heavy ETOH use). Echo (8/17) with EF 45-50%, Cardiolite 10/17 with EF 40%. - NYHA II - Volume status stable on exam, appears euvolemic. He has recently lost about 13 pounds. Lasix stopped for orthostasis. Continue to hold. I advised him to restart lasix 20mg  daily if he gains more than 3 pounds in one day or 5 pounds in one week.  - Continue Coreg 3.125 mg BID. Will not uptitrate with recent hypotension and HR is 53.  - Continue lisinopril 1.25 mg daily hs. BP remains soft today, will not increase.  - BMET last week stable. Will repeat BMET at next appointment.  4. Permanent atrial fibrillation: No awareness of Afib.  He failed cardioversion in 9/17. He is not a good candidate for antiarrhythmics given baseline long QT and history of CAD.  - Rate  controlled on Coreg alone. Has seen Roderic Palau in the past. No anti-arrhythmics with long QTC . - Continue Xarelto  - This patients CHA2DS2-VASc Score and unadjusted Ischemic Stroke Rate (% per year) is equal to 4.8 % stroke rate/year from a score of 4 Above score calculated as 1 point each if present [CHF, HTN, DM, Vascular=MI/PAD/Aortic Plaque, Age if 65-74, or Male], 2 points each if present [Age > 75, or Stroke/TIA/TE] - Continue Xarelto 5. Suspect sleep apnea:  - He has been encouraged to get a sleep study in the past but he has declined. Denies snoring, hypersomnolence.  6. Evaluation of cardiac risk for surgery  - Patient is following with Dr. Johney Maine, needs hernia repair. He is able to complete at least 3 METs of  activity without SOB. Recent Myoview without ischemia. He denies chest pain.   Follow up in 2 months with Dr.McLean, he is due for an Echo, will have him complete this prior to his appointment.   Arbutus Leas 08/01/2016

## 2016-08-01 NOTE — Patient Instructions (Signed)
Your physician has requested that you have an echocardiogram. Echocardiography is a painless test that uses sound waves to create images of your heart. It provides your doctor with information about the size and shape of your heart and how well your heart's chambers and valves are working. This procedure takes approximately one hour. There are no restrictions for this procedure.  Your physician recommends that you schedule a follow-up appointment in: 2 months with Dr Aundra Dubin  Do the following things EVERYDAY: 1) Weigh yourself in the morning before breakfast. Write it down and keep it in a log. 2) Take your medicines as prescribed 3) Eat low salt foods-Limit salt (sodium) to 2000 mg per day.  4) Stay as active as you can everyday 5) Limit all fluids for the day to less than 2 liters

## 2016-08-29 DIAGNOSIS — K469 Unspecified abdominal hernia without obstruction or gangrene: Secondary | ICD-10-CM | POA: Diagnosis not present

## 2016-09-26 DIAGNOSIS — I5032 Chronic diastolic (congestive) heart failure: Secondary | ICD-10-CM | POA: Diagnosis not present

## 2016-09-26 DIAGNOSIS — Z0000 Encounter for general adult medical examination without abnormal findings: Secondary | ICD-10-CM | POA: Diagnosis not present

## 2016-09-26 DIAGNOSIS — J449 Chronic obstructive pulmonary disease, unspecified: Secondary | ICD-10-CM | POA: Diagnosis not present

## 2016-09-26 DIAGNOSIS — E782 Mixed hyperlipidemia: Secondary | ICD-10-CM | POA: Diagnosis not present

## 2016-09-26 DIAGNOSIS — Z23 Encounter for immunization: Secondary | ICD-10-CM | POA: Diagnosis not present

## 2016-09-26 DIAGNOSIS — Z6828 Body mass index (BMI) 28.0-28.9, adult: Secondary | ICD-10-CM | POA: Diagnosis not present

## 2016-09-26 DIAGNOSIS — N400 Enlarged prostate without lower urinary tract symptoms: Secondary | ICD-10-CM | POA: Diagnosis not present

## 2016-09-26 DIAGNOSIS — I4891 Unspecified atrial fibrillation: Secondary | ICD-10-CM | POA: Diagnosis not present

## 2016-09-26 DIAGNOSIS — E663 Overweight: Secondary | ICD-10-CM | POA: Diagnosis not present

## 2016-10-04 ENCOUNTER — Ambulatory Visit (HOSPITAL_COMMUNITY)
Admission: RE | Admit: 2016-10-04 | Discharge: 2016-10-04 | Disposition: A | Discharge status: Home / Self Care | Payer: Medicare Other | Source: Ambulatory Visit | Attending: Internal Medicine | Admitting: Internal Medicine

## 2016-10-04 ENCOUNTER — Encounter (HOSPITAL_COMMUNITY): Payer: Self-pay | Admitting: Cardiology

## 2016-10-04 ENCOUNTER — Ambulatory Visit (HOSPITAL_BASED_OUTPATIENT_CLINIC_OR_DEPARTMENT_OTHER)
Admission: RE | Admit: 2016-10-04 | Discharge: 2016-10-04 | Disposition: A | Discharge status: Home / Self Care | Payer: Medicare Other | Source: Ambulatory Visit | Attending: Cardiology | Admitting: Cardiology

## 2016-10-04 VITALS — BP 117/69 | HR 64 | Wt 178.5 lb

## 2016-10-04 DIAGNOSIS — I482 Chronic atrial fibrillation: Secondary | ICD-10-CM

## 2016-10-04 DIAGNOSIS — I509 Heart failure, unspecified: Secondary | ICD-10-CM | POA: Diagnosis not present

## 2016-10-04 DIAGNOSIS — I25700 Atherosclerosis of coronary artery bypass graft(s), unspecified, with unstable angina pectoris: Secondary | ICD-10-CM

## 2016-10-04 DIAGNOSIS — I5022 Chronic systolic (congestive) heart failure: Secondary | ICD-10-CM

## 2016-10-04 DIAGNOSIS — I4821 Permanent atrial fibrillation: Secondary | ICD-10-CM

## 2016-10-04 DIAGNOSIS — I4891 Unspecified atrial fibrillation: Secondary | ICD-10-CM | POA: Insufficient documentation

## 2016-10-04 MED ORDER — PERFLUTREN LIPID MICROSPHERE
1.0000 mL | INTRAVENOUS | Status: AC | PRN
  Filled 2016-10-04: qty 10

## 2016-10-04 MED ORDER — LISINOPRIL 2.5 MG PO TABS: 2.5000 mg | ORAL_TABLET | Freq: Every day | ORAL | 3 refills | Status: DC

## 2016-10-04 NOTE — Progress Notes (Addendum)
PCP: Dr. Gerarda Fraction Cardiology: Dr. Aundra Dubin  71 yo with history of CAD s/p CABG and rectal cancer s/p abdominal perineal resection in 5/14 presents for cardiology followup.  He had CABG x 6 in 2005.  Stress tests later in 2005 and in 2008 were normal.  As above, he had APR in 5/14.  He had a complicated colostomy revision in 12/14.  After this operation, he developed abdominal wall cellulitis and septic shock, also in 12/14.  He was positive for influenza also.  His colostomy will be permanent. He had moved to [facility] but has returned to live in [place].   At a prior appointment, he was noted to be in atrial fibrillation with rate control.  He did not realize that he was in atrial fibrillation.  Holter monitor showed persistent AF.  He looked volume overloaded on exam and Lasix was started.  Echo in 8/17 showed EF 45-50%, diffuse hypokinesis.  He underwent DCCV 9/17 but did not hold NSR.  Lexiscan Cardiolite in 10/17 showed EF 40%, lateral fixed defect, no ischemia.  Echo today was reviewed, EF 40% with wall motion abnormalities as noted below.    He has been doing very well.  Weight is down 15 lbs since last appointment.  He has changed his diet and is exercising more.  No exertional dyspnea, better with weight loss.  More active.  Doing yardwork without problems.  No chest pain.  No BRBPR/melena, continues on Xarelto.  Remains in atrial fibrillation but does not feel palpitations.  He has a ventral hernia that needs repair (has been seeing Dr Johney Maine).  He is not taking any Lasix.  Today, HR low in 40s at rest but denies lightheadedness or syncope.     Labs (3/14): LDL 83, HDL 64, TGs 203 Labs (4/14): K 3.4, creatinine 0.77 Labs (6/14): LDL 63, HDL 41 Labs (5/15): creatinine 0.78 Labs (8/17): LDL 63, HDL 52 Labs (9/17): TSH normal, K 4, creatinine 0.87 Labs (10/17): K 4.2, creatinine 0.77, BNP 108.5 Labs (12/17): HCT 41.6, LDL 68, HDL 50 Labs (6/18): K 3.9, creatinine 1.06  PMH: 1. Rectal cancer:  Abdominal perineal resection in 5/14, has permanent colostomy.  2. CAD: CABG in 2005 with LIMA-LAD, SVG-D, seq SVG-OM1/dLCx, seq SVG-PDA/PLV.  Stress myoviews in 11/05 and in 2008 were normal. 3. Hyperlipidemia 4. Atrial fibrillation: First noted 8/17.  - DCCV 9/17: Converted to NSR but went back into atrial fibrillation before leaving hospital.  5. Chronic systolic CHF: Ischemic cardiomyopathy.  - Echo (8/17) with EF 45-50%, mild LVH, mildly dilated LV.   - Lexiscan Cardiolite (10/17): EF 40%, no ischemia, lateral fixed defect.  - Echo (8/18): EF 40%, anterolateral/inferolateral HK, basal to mid inferior AK, normal RV size with mildly decreased systolic function.  6. Bradycardia  SH: Lives in [place] with wife, quit smoking 2004, continues moderate ETOH intake, retired from Research officer, trade union and Production manager (Engineer, production).    FH: CAD (brother), CVA (father), CHF (mother).   ROS: All systems were reviewed and negative except as per HPI.   Current Outpatient Prescriptions  Medication Sig Dispense Refill  . ALPRAZolam (XANAX) 1 MG tablet Take 1 mg by mouth 4 (four) times daily as needed for anxiety or sleep.     Marland Kitchen atorvastatin (LIPITOR) 40 MG tablet TAKE 1 TABLET BY MOUTH DAILY 30 tablet 4  . carvedilol (COREG) 3.125 MG tablet Take 1 tablet (3.125 mg total) by mouth 2 (two) times daily. 180 tablet 3  . fluticasone (FLONASE) 50 MCG/ACT nasal spray Place 2  sprays into both nostrils daily.    . hydrOXYzine (ATARAX/VISTARIL) 25 MG tablet Take 1 tablet (25 mg total) by mouth 2 (two) times daily as needed for itching. 60 tablet 6  . lisinopril (PRINIVIL,ZESTRIL) 2.5 MG tablet Take 1 tablet (2.5 mg total) by mouth at bedtime. 30 tablet 3  . loratadine (CLARITIN) 10 MG tablet Take 1 tablet (10 mg total) by mouth daily as needed for allergies.    . rivaroxaban (XARELTO) 20 MG TABS tablet Take 1 tablet (20 mg total) by mouth daily with supper. 30 tablet 6  . tamsulosin (FLOMAX) 0.4 MG CAPS Take 0.4 mg by mouth  daily after breakfast.      No current facility-administered medications for this encounter.     BP 117/69   Pulse 64   Wt 178 lb 8 oz (81 kg)   SpO2 98%   BMI 27.96 kg/m  General: NAD Neck: No JVD, no thyromegaly or thyroid nodule.  Lungs: Clear to auscultation bilaterally with normal respiratory effort. CV: Nondisplaced PMI.  Heart bradycardic, irregular S1/S2, no S3/S4, no murmur.  No peripheral edema.  No carotid bruit.  Normal pedal pulses.  Abdomen: Soft, nontender, no hepatosplenomegaly, no distention.  Skin: Intact without lesions or rashes.  Neurologic: Alert and oriented x 3.  Psych: Normal affect. Extremities: No clubbing or cyanosis.  HEENT: Normal.   Assessment/Plan: 1. CAD: S/p CABG.  No chest pain or dyspnea.  Cardiolite in 10/17 with prior lateral MI but no ischemia.  - No ASA given stable CAD and Xarelto use.  - Continue statin.  2. Hyperlipidemia: Good lipids 12/17.  3. Chronic systolic CHF: Echo was done today and reviewed, EF 40% with wall motion abnormalities.  He is not volume overloaded on exam, NYHA class I-II symptoms. - He is off Lasix, can continue to stay off.   - Continue Coreg 3.125 mg bid.  HR is slow but no lightheadedness, with cardiomyopathy would like him to continue Coreg if possible.  - Increase lisinopril to 2.5 mg daily.  He may not tolerate further increase, has been very lightheaded with increased meds in the past. BMET in 10 days.   4. Atrial fibrillation:  He does not feel the atrial fibrillation, not sure how long it has been present.  He failed cardioversion in 9/17.  He is not a good candidate for antiarrhythmics given baseline long QT and history of CAD.    - Slow HR today concerning for possible tachy-brady syndrome but no symptoms. Will continue Coreg for now but may have to stop in future.  - Continue Xarelto. CBC today.  5. Suspect sleep apnea: I have encouraged him to get a sleep study but he is not interested.  Followup in 4  months   Loralie Champagne 10/04/2016

## 2016-10-04 NOTE — Patient Instructions (Signed)
Increase Lisinopril to 2.5mg  (1 tab) daily   Your physician recommends that you return for lab work in: 10 days  Your physician recommends that you schedule a follow-up appointment in: 4 months we will call you to schedule

## 2016-10-04 NOTE — Progress Notes (Signed)
  Echocardiogram 2D Echocardiogram has been performed.  Cody Coleman 10/04/2016, 9:24 AM

## 2016-10-04 NOTE — Addendum Note (Signed)
Encounter addended by: Larey Dresser, MD on: 10/04/2016  1:35 PM<BR>    Actions taken: Sign clinical note

## 2016-10-14 ENCOUNTER — Other Ambulatory Visit (HOSPITAL_COMMUNITY): Payer: Medicare Other

## 2016-10-18 ENCOUNTER — Ambulatory Visit (HOSPITAL_COMMUNITY)
Admission: RE | Admit: 2016-10-18 | Discharge: 2016-10-18 | Disposition: A | Discharge status: Home / Self Care | Payer: Medicare Other | Source: Ambulatory Visit | Attending: Cardiology | Admitting: Cardiology

## 2016-10-18 DIAGNOSIS — I509 Heart failure, unspecified: Secondary | ICD-10-CM | POA: Diagnosis not present

## 2016-10-18 LAB — BASIC METABOLIC PANEL
ANION GAP: 8 (ref 5–15)
BUN: 14 mg/dL (ref 6–20)
CALCIUM: 8.8 mg/dL — AB (ref 8.9–10.3)
CHLORIDE: 108 mmol/L (ref 101–111)
CO2: 23 mmol/L (ref 22–32)
Creatinine, Ser: 0.82 mg/dL (ref 0.61–1.24)
GFR calc non Af Amer: >60 mL/min (ref >60)
Glucose, Bld: 72 mg/dL (ref 65–99)
POTASSIUM: 3.7 mmol/L (ref 3.5–5.1)
Sodium: 139 mmol/L (ref 135–145)

## 2016-10-18 LAB — CBC
HEMATOCRIT: 40 % (ref 39.0–52.0)
HEMOGLOBIN: 13.4 g/dL (ref 13.0–17.0)
MCH: 29.8 pg (ref 26.0–34.0)
MCHC: 33.5 g/dL (ref 30.0–36.0)
MCV: 88.9 fL (ref 78.0–100.0)
Platelets: 144 10*3/uL — ABNORMAL LOW (ref 150–400)
RBC: 4.5 MIL/uL (ref 4.22–5.81)
RDW: 13.8 % (ref 11.5–15.5)
WBC: 7.7 10*3/uL (ref 4.0–10.5)

## 2016-10-24 ENCOUNTER — Telehealth (HOSPITAL_COMMUNITY): Payer: Self-pay | Admitting: *Deleted

## 2016-10-24 NOTE — Telephone Encounter (Signed)
Advanced Heart Failure Triage Encounter  Patient Name: Cody Coleman  Date of Call: 10/24/16  Problem: Surgical Clearance  Patient is having tooth extraction and asking how many days prior he needs to hold his Xarelto.   Plan:  Sending to Dr. Aundra Dubin to review and will call patient back with his response.   Darron Doom, RN

## 2016-10-24 NOTE — Telephone Encounter (Signed)
Would ask his dentist if it needs to be held.  If the dentist wants Xarelto held, hold 2 days prior and restart afterwards.

## 2016-10-25 NOTE — Telephone Encounter (Signed)
Called and spoke with patient, he will ask his dentist if its necessary to hold it and if he says yes then he is aware to hold it two days prior to procedure and restart afterwards.

## 2016-11-05 ENCOUNTER — Other Ambulatory Visit (HOSPITAL_COMMUNITY): Payer: Self-pay | Admitting: Cardiology

## 2016-11-16 ENCOUNTER — Telehealth (HOSPITAL_COMMUNITY): Payer: Self-pay | Admitting: *Deleted

## 2016-11-16 NOTE — Telephone Encounter (Signed)
Pt called asking about his clearance for hernia repair.  We did receive note from [facility].  Per Dr Aundra Dubin:  Madaline Brilliant to hold Xarelto x2 days for surgery"  Note faxed back to them at [phone], pt is aware

## 2016-11-17 DIAGNOSIS — Z23 Encounter for immunization: Secondary | ICD-10-CM | POA: Diagnosis not present

## 2016-11-22 ENCOUNTER — Ambulatory Visit: Payer: Self-pay | Admitting: Surgery

## 2016-11-22 DIAGNOSIS — Z01818 Encounter for other preprocedural examination: Secondary | ICD-10-CM | POA: Diagnosis not present

## 2016-11-22 DIAGNOSIS — K435 Parastomal hernia without obstruction or  gangrene: Secondary | ICD-10-CM

## 2016-11-22 NOTE — H&P (View-Only) (Signed)
Cody Coleman 11/22/2016 12:02 PM Location: [facility] Patient #: [id number] DOB: 09-Mar-1945 Married / Language: Cody Coleman [place] / Race: White Male  History of Present Illness Cody Hector MD; 11/22/2016 12:52 PM) The patient is a 71 year old male who presents for an evaluation of a hernia. Note for "Hernia": ` ` ` Patient sent for surgical consultation at the request of Dr. Marcello Coleman  Chief Complaint: Recurrent parastomal hernia  The patient is a pleasant male. He required an abdominoperineal resection for a distal rectal cancer. T3N0. This was in May 2014. He developed some prolapse around it and required ostomy revision 7 months later. He developed a moderate-sized parastomal hernia. He underwent laparoscopic primary repair with underlay biologic mesh reinforcement with Stratice biologic mesh 10/25/2013. He's developed increased bulging in concern for recurrent parastomal hernia. Saw Dr. Marcello Coleman. She was suspicious for recurrent hernia. CAT scan confirms moderate sized parastomal hernia. Small intestine within it. She for the patient me for evaluation for recurrent parastomal hernia. I discussed options and offered laparoscopic repair in the summertime. Noted it may need to be resited given his obesity and large sac to lower recurrence. He had questions. We talked on the phone. Office talked him. He discussed again with Dr. Marcello Coleman.  Over the past few months, he seen cardiology. He has intentionally lost weight and changed his diet. Intentionally lost 40 pounds. He's feeling much better. Appetite, energy, activity level much improved He is emptying his colostomy bag once or twice a day. He really likes the location of it below his belt line and wishes to try and avoid relocation of the colostomy. He denies any fevers or chills. Appetite otherwise good. Energy good. He's been intentionally trying to lose weight. He remains on Xerelto for his coronary disease with  a prior bypass graft. He had many questions about surgery and wished to physically come in again and talk about surgery. He comes today with his wife.  (Review of systems as stated in this history (HPI) or in the review of systems. Otherwise all other 12 point ROS are negative)  Problem List/Past Medical Cody Hector, MD; 11/22/2016 12:56 PM) HISTORY OF RECTAL CANCER (Z85.048) PERISTOMAL HERNIA (K46.9) PARASTOMAL HERNIA WITHOUT OBSTRUCTION OR GANGRENE (K43.5) PREOP - [place] - ENCOUNTER FOR PREOPERATIVE EXAMINATION FOR GENERAL SURGICAL PROCEDURE (P80.998)  Past Surgical History Cody Hector, MD; 11/22/2016 12:56 PM) Heart Surgery 02/2003 1990-1991; 2 stents Colostomy 05/2012 Hernia Repair 2014  Diagnostic Studies History Cody Hector, MD; 11/22/2016 12:56 PM) Colonoscopy 06/14/2013  Allergies (Tanisha A. Owens Shark, [place]; 11/22/2016 12:03 PM) Allergies Reconciled Augmentin *PENICILLINS*  Medication History (Tanisha A. Owens Shark, [place]; 11/22/2016 12:03 PM) HydrOXYzine HCl (25MG  Tablet, Oral) Active. Potassium Chloride Crys ER (10MEQ Tablet ER, Oral) Active. Tamsulosin HCl (0.4MG  Capsule, Oral) Active. Carvedilol (3.125MG  Tablet, Oral) Active. Lisinopril (2.5MG  Tablet, Oral) Active. Xarelto (20MG  Tablet, Oral) Active. Xanax (1MG  Tablet, Oral) Active. Flomax (0.4MG  Capsule ER 24HR, Oral) Active. Lipitor (40MG  Tablet, Oral) Active. Medications Reconciled    Vitals (Tanisha A. Brown RMA; 11/22/2016 12:03 PM) 11/22/2016 12:03 PM Weight: 168.4 lb Height: 67in Body Surface Area: 1.88 m Body Mass Index: 26.37 kg/m  Temp.: 98.11F  Pulse: 76 (Regular)  BP: 138/84 (Sitting, Left Arm, Standard)      Physical Exam Cody Hector MD; 11/22/2016 12:49 PM)  General Mental Status-Alert. General Appearance-Not in acute distress, Not Sickly. Orientation-Oriented X3. Hydration-Well hydrated. Voice-Normal. Note: Pleasant.  Chatty  Integumentary Global Assessment Normal Exam - Axillae: non-tender, no inflammation or ulceration,  no drainage. and Distribution of scalp and body hair is normal. General Characteristics Temperature - normal warmth is noted.  Head and Neck Head-normocephalic, atraumatic with no lesions or palpable masses. Face Global Assessment - atraumatic, no absence of expression. Neck Global Assessment - no abnormal movements, no bruit auscultated on the right, no bruit auscultated on the left, no decreased range of motion, non-tender. Trachea-midline. Thyroid Gland Characteristics - non-tender.  Eye Eyeball - Left-Extraocular movements intact, No Nystagmus. Eyeball - Right-Extraocular movements intact, No Nystagmus. Cornea - Left-No Hazy. Cornea - Right-No Hazy. Sclera/Conjunctiva - Left-No scleral icterus, No Discharge. Sclera/Conjunctiva - Right-No scleral icterus, No Discharge. Pupil - Left-Direct reaction to light normal. Pupil - Right-Direct reaction to light normal.  ENMT Ears Pinna - Left - no drainage observed, no generalized tenderness observed. Right - no drainage observed, no generalized tenderness observed. Nose and Sinuses Nose - no destructive lesion observed. Nares - Left - quiet respiration. Right - quiet respiration. Mouth and Throat Lips - Upper Lip - no fissures observed, no pallor noted. Lower Lip - no fissures observed, no pallor noted. Nasopharynx - no discharge present. Oral Cavity/Oropharynx - Tongue - no dryness observed. Oral Mucosa - no cyanosis observed. Hypopharynx - no evidence of airway distress observed.  Chest and Lung Exam Inspection Movements - Normal and Symmetrical. Accessory muscles - No use of accessory muscles in breathing. Palpation Normal exam - Non-tender. Auscultation Breath sounds - Normal and Clear.  Cardiovascular Auscultation Rhythm - Regular. Murmurs & Other Heart Sounds - Normal exam - No Murmurs and No  Systolic Clicks.  Abdomen Inspection Normal Exam - No Visible peristalsis and No Abnormal pulsations. Umbilicus - No Bleeding, No Urine drainage. Palpation/Percussion Normal exam - Soft, Non Tender, No Rebound tenderness, No Rigidity (guarding) and No Cutaneous hyperesthesia. Note: No longer obese! Moderate bulging left lower quadrant around colostomy 12 x 10 cm region. Reducible. Fascial defect 5x4cm Swiss cheese superior-lateral around his colostomy. Colostomy pink. Belted ostomy in place with good seal. Skin clean. Consistent with moderate sized parastomal hernia.  Male Genitourinary Sexual Maturity Tanner 5 - Adult hair pattern and Adult penile size and shape.  Peripheral Vascular Upper Extremity Inspection - Left - No Cyanotic nailbeds, Not Ischemic. Right - No Cyanotic nailbeds, Not Ischemic.  Neurologic Neurologic evaluation reveals -normal attention span and ability to concentrate, able to name objects and repeat phrases. Appropriate fund of knowledge , normal sensation and normal coordination. Mental Status Affect - not angry, not paranoid. Cranial Nerves-Normal Bilaterally. Gait-Normal.  Neuropsychiatric Mental status exam performed with findings of-able to articulate well with normal speech/language, rate, volume and coherence, thought content normal with ability to perform basic computations and apply abstract reasoning and no evidence of hallucinations, delusions, obsessions or homicidal/suicidal ideation.  Musculoskeletal Global Assessment Spine, Ribs and Pelvis - no instability, subluxation or laxity. Right Upper Extremity - no instability, subluxation or laxity.  Lymphatic Head & Neck General Head & Neck Lymphatics: Bilateral - Description - No Localized lymphadenopathy. Axillary General Axillary Region: Bilateral - Description - No Localized lymphadenopathy. Femoral & Inguinal Generalized Femoral & Inguinal Lymphatics: Left: Right - Description -  No Localized lymphadenopathy. Description - No Localized lymphadenopathy.    Assessment & Plan Cody Hector MD; 11/22/2016 12:49 PM)  PARASTOMAL HERNIA WITHOUT OBSTRUCTION OR GANGRENE (K43.5) Impression: Recurrent parastomal hernia around LLQ permanent colostomy. Evidence of small bowel within it in the past. Ultimately reducible.  I am proud of the fact that he is changed his life around & intentionally  lost weight with the help of his medical and cardiology team. His risks of surgery less and the repair will be more durable as a result.  He would like to avoid re-siting the colostomy as it seems to be in an otherwise good location. I cautioned that there is a chance it may need to relocated, but hopefully less now. Plan primary & Phasix Sugarbaker-type repair on the colostomy and a larger sheet of more permanent mesh around all that. Hopefully can get the best of both worlds of protecting the colon from erosion while lowering recurrence with a more permanent mesh, especially since the prior repair with biologic mesh failed. His operative time and risks are higher with old mesh in place, but we will take our time and be safe. I do not feel strongly I need partner to help me with this but will have a low threshold for help interoperatively if needed.  He had concerns about seroma re-formation hasn't happened the last time.. I noted we often place a closed bulb drain perioperatively and remove it quickly to minimize risk of seroma yet not increase risk of mesh infection. We'll have to see how that looks at the time of surgery.  Cardiac clearance done. Okay to hold Pradaxa 2 days before surgery. Usually will restart postoperative day 1, but we'll see how things go. I did caution him against the risks of hematoma and ecchymosis. They understand.  They are living closer to the region now and not 300 miles away. Hopefully that'll make his recovery less bumpy & easier to manage. Most likely the  patient will need to stay few nights, but we will see.  He had numerous other approriate questions. I took the time to help answer them. 45 minutes. Patient and his wife feel reassured and wished to proceed with recommendations. Surgery in a few week  PREOP - [place] FOR GENERAL SURGICAL PROCEDURE (Z01.818)  Current Plans Written instructions provided The anatomy & physiology of the abdominal wall was discussed. The pathophysiology of hernias was discussed. Natural history risks without surgery including progeressive enlargement, pain, incarceration, & strangulation was discussed. Contributors to complications such as smoking, obesity, diabetes, prior surgery, etc were discussed.  I feel the risks of no intervention will lead to serious problems that outweigh the operative risks; therefore, I recommended surgery to reduce and repair the hernia. I explained laparoscopic techniques with possible need for an open approach. I noted the probable use of mesh to patch and/or buttress the hernia repair  Risks such as bleeding, infection, abscess, need for further treatment, heart attack, death, and other risks were discussed. I noted a good likelihood this will help address the problem. Goals of post-operative recovery were discussed as well. Possibility that this will not correct all symptoms was explained. I stressed the importance of low-impact activity, aggressive pain control, avoiding constipation, & not pushing through pain to minimize risk of post-operative chronic pain or injury. Possibility of reherniation especially with smoking, obesity, diabetes, immunosuppression, and other health conditions was discussed. We will work to minimize complications.  An educational handout further explaining the pathology & treatment options was given as well. Questions were answered. The patient expresses understanding & wishes to proceed with surgery.  Pt  Education - [facility] Hernia Post-Op HCI (Kedric Bumgarner): discussed with patient and provided information. Pt Education - [facility] Pain Control (Acy Orsak)  Cody Coleman, M.D., F.A.C.S. Gastrointestinal and Minimally Invasive Surgery [facility] [address], [address]  [place], [place] [address] [phone] Main / Paging

## 2016-11-22 NOTE — H&P (Signed)
Cody Coleman 11/22/2016 12:02 PM Location: [facility] Patient #: [id number] DOB: October 20, 1945 Married / Language: Cody Coleman / Race: White Male  History of Present Illness Cody Hector MD; 11/22/2016 12:52 PM) The patient is a 71 year old male who presents for an evaluation of a hernia. Note for "Hernia": ` ` ` Patient sent for surgical consultation at the request of Dr. Marcello Moores  Chief Complaint: Recurrent parastomal hernia  The patient is a pleasant male. He required an abdominoperineal resection for a distal rectal cancer. T3N0. This was in May 2014. He developed some prolapse around it and required ostomy revision 7 months later. He developed a moderate-sized parastomal hernia. He underwent laparoscopic primary repair with underlay biologic mesh reinforcement with Stratice biologic mesh 10/25/2013. He's developed increased bulging in concern for recurrent parastomal hernia. Saw Dr. Marcello Moores. She was suspicious for recurrent hernia. CAT scan confirms moderate sized parastomal hernia. Small intestine within it. She for the patient me for evaluation for recurrent parastomal hernia. I discussed options and offered laparoscopic repair in the summertime. Noted it may need to be resited given his obesity and large sac to lower recurrence. He had questions. We talked on the phone. Office talked him. He discussed again with Dr. Marcello Moores.  Over the past few months, he seen cardiology. He has intentionally lost weight and changed his diet. Intentionally lost 40 pounds. He's feeling much better. Appetite, energy, activity level much improved He is emptying his colostomy bag once or twice a day. He really likes the location of it below his belt line and wishes to try and avoid relocation of the colostomy. He denies any fevers or chills. Appetite otherwise good. Energy good. He's been intentionally trying to lose weight. He remains on Xerelto for his coronary disease with  a prior bypass graft. He had many questions about surgery and wished to physically come in again and talk about surgery. He comes today with his wife.  (Review of systems as stated in this history (HPI) or in the review of systems. Otherwise all other 12 point ROS are negative)  Problem List/Past Medical Cody Hector, MD; 11/22/2016 12:56 PM) HISTORY OF RECTAL CANCER (Z85.048) PERISTOMAL HERNIA (K46.9) PARASTOMAL HERNIA WITHOUT OBSTRUCTION OR GANGRENE (K43.5) PREOP - [place] - ENCOUNTER FOR PREOPERATIVE EXAMINATION FOR GENERAL SURGICAL PROCEDURE (L24.401)  Past Surgical History Cody Hector, MD; 11/22/2016 12:56 PM) Heart Surgery 02/2003 1990-1991; 2 stents Colostomy 05/2012 Hernia Repair 2014  Diagnostic Studies History Cody Hector, MD; 11/22/2016 12:56 PM) Colonoscopy 06/14/2013  Allergies (Tanisha A. Owens Shark, [place]; 11/22/2016 12:03 PM) Allergies Reconciled Augmentin *PENICILLINS*  Medication History (Tanisha A. Owens Shark, [place]; 11/22/2016 12:03 PM) HydrOXYzine HCl (25MG  Tablet, Oral) Active. Potassium Chloride Crys ER (10MEQ Tablet ER, Oral) Active. Tamsulosin HCl (0.4MG  Capsule, Oral) Active. Carvedilol (3.125MG  Tablet, Oral) Active. Lisinopril (2.5MG  Tablet, Oral) Active. Xarelto (20MG  Tablet, Oral) Active. Xanax (1MG  Tablet, Oral) Active. Flomax (0.4MG  Capsule ER 24HR, Oral) Active. Lipitor (40MG  Tablet, Oral) Active. Medications Reconciled    Vitals (Tanisha A. Brown RMA; 11/22/2016 12:03 PM) 11/22/2016 12:03 PM Weight: 168.4 lb Height: 67in Body Surface Area: 1.88 m Body Mass Index: 26.37 kg/m  Temp.: 98.41F  Pulse: 76 (Regular)  BP: 138/84 (Sitting, Left Arm, Standard)      Physical Exam Cody Hector MD; 11/22/2016 12:49 PM)  General Mental Status-Alert. General Appearance-Not in acute distress, Not Sickly. Orientation-Oriented X3. Hydration-Well hydrated. Voice-Normal. Note: Pleasant.  Chatty  Integumentary Global Assessment Normal Exam - Axillae: non-tender, no inflammation or ulceration,  no drainage. and Distribution of scalp and body hair is normal. General Characteristics Temperature - normal warmth is noted.  Head and Neck Head-normocephalic, atraumatic with no lesions or palpable masses. Face Global Assessment - atraumatic, no absence of expression. Neck Global Assessment - no abnormal movements, no bruit auscultated on the right, no bruit auscultated on the left, no decreased range of motion, non-tender. Trachea-midline. Thyroid Gland Characteristics - non-tender.  Eye Eyeball - Left-Extraocular movements intact, No Nystagmus. Eyeball - Right-Extraocular movements intact, No Nystagmus. Cornea - Left-No Hazy. Cornea - Right-No Hazy. Sclera/Conjunctiva - Left-No scleral icterus, No Discharge. Sclera/Conjunctiva - Right-No scleral icterus, No Discharge. Pupil - Left-Direct reaction to light normal. Pupil - Right-Direct reaction to light normal.  ENMT Ears Pinna - Left - no drainage observed, no generalized tenderness observed. Right - no drainage observed, no generalized tenderness observed. Nose and Sinuses Nose - no destructive lesion observed. Nares - Left - quiet respiration. Right - quiet respiration. Mouth and Throat Lips - Upper Lip - no fissures observed, no pallor noted. Lower Lip - no fissures observed, no pallor noted. Nasopharynx - no discharge present. Oral Cavity/Oropharynx - Tongue - no dryness observed. Oral Mucosa - no cyanosis observed. Hypopharynx - no evidence of airway distress observed.  Chest and Lung Exam Inspection Movements - Normal and Symmetrical. Accessory muscles - No use of accessory muscles in breathing. Palpation Normal exam - Non-tender. Auscultation Breath sounds - Normal and Clear.  Cardiovascular Auscultation Rhythm - Regular. Murmurs & Other Heart Sounds - Normal exam - No Murmurs and No  Systolic Clicks.  Abdomen Inspection Normal Exam - No Visible peristalsis and No Abnormal pulsations. Umbilicus - No Bleeding, No Urine drainage. Palpation/Percussion Normal exam - Soft, Non Tender, No Rebound tenderness, No Rigidity (guarding) and No Cutaneous hyperesthesia. Note: No longer obese! Moderate bulging left lower quadrant around colostomy 12 x 10 cm region. Reducible. Fascial defect 5x4cm Swiss cheese superior-lateral around his colostomy. Colostomy pink. Belted ostomy in place with good seal. Skin clean. Consistent with moderate sized parastomal hernia.  Male Genitourinary Sexual Maturity Tanner 5 - Adult hair pattern and Adult penile size and shape.  Peripheral Vascular Upper Extremity Inspection - Left - No Cyanotic nailbeds, Not Ischemic. Right - No Cyanotic nailbeds, Not Ischemic.  Neurologic Neurologic evaluation reveals -normal attention span and ability to concentrate, able to name objects and repeat phrases. Appropriate fund of knowledge , normal sensation and normal coordination. Mental Status Affect - not angry, not paranoid. Cranial Nerves-Normal Bilaterally. Gait-Normal.  Neuropsychiatric Mental status exam performed with findings of-able to articulate well with normal speech/language, rate, volume and coherence, thought content normal with ability to perform basic computations and apply abstract reasoning and no evidence of hallucinations, delusions, obsessions or homicidal/suicidal ideation.  Musculoskeletal Global Assessment Spine, Ribs and Pelvis - no instability, subluxation or laxity. Right Upper Extremity - no instability, subluxation or laxity.  Lymphatic Head & Neck General Head & Neck Lymphatics: Bilateral - Description - No Localized lymphadenopathy. Axillary General Axillary Region: Bilateral - Description - No Localized lymphadenopathy. Femoral & Inguinal Generalized Femoral & Inguinal Lymphatics: Left: Right - Description -  No Localized lymphadenopathy. Description - No Localized lymphadenopathy.    Assessment & Plan Cody Hector MD; 11/22/2016 12:49 PM)  PARASTOMAL HERNIA WITHOUT OBSTRUCTION OR GANGRENE (K43.5) Impression: Recurrent parastomal hernia around LLQ permanent colostomy. Evidence of small bowel within it in the past. Ultimately reducible.  I am proud of the fact that he is changed his life around & intentionally  lost weight with the help of his medical and cardiology team. His risks of surgery less and the repair will be more durable as a result.  He would like to avoid re-siting the colostomy as it seems to be in an otherwise good location. I cautioned that there is a chance it may need to relocated, but hopefully less now. Plan primary & Phasix Sugarbaker-type repair on the colostomy and a larger sheet of more permanent mesh around all that. Hopefully can get the best of both worlds of protecting the colon from erosion while lowering recurrence with a more permanent mesh, especially since the prior repair with biologic mesh failed. His operative time and risks are higher with old mesh in place, but we will take our time and be safe. I do not feel strongly I need partner to help me with this but will have a low threshold for help interoperatively if needed.  He had concerns about seroma re-formation hasn't happened the last time.. I noted we often place a closed bulb drain perioperatively and remove it quickly to minimize risk of seroma yet not increase risk of mesh infection. We'll have to see how that looks at the time of surgery.  Cardiac clearance done. Okay to hold Pradaxa 2 days before surgery. Usually will restart postoperative day 1, but we'll see how things go. I did caution him against the risks of hematoma and ecchymosis. They understand.  They are living closer to the region now and not 300 miles away. Hopefully that'll make his recovery less bumpy & easier to manage. Most likely the  patient will need to stay few nights, but we will see.  He had numerous other approriate questions. I took the time to help answer them. 45 minutes. Patient and his wife feel reassured and wished to proceed with recommendations. Surgery in a few week  PREOP - [place] FOR GENERAL SURGICAL PROCEDURE (Z01.818)  Current Plans Written instructions provided The anatomy & physiology of the abdominal wall was discussed. The pathophysiology of hernias was discussed. Natural history risks without surgery including progeressive enlargement, pain, incarceration, & strangulation was discussed. Contributors to complications such as smoking, obesity, diabetes, prior surgery, etc were discussed.  I feel the risks of no intervention will lead to serious problems that outweigh the operative risks; therefore, I recommended surgery to reduce and repair the hernia. I explained laparoscopic techniques with possible need for an open approach. I noted the probable use of mesh to patch and/or buttress the hernia repair  Risks such as bleeding, infection, abscess, need for further treatment, heart attack, death, and other risks were discussed. I noted a good likelihood this will help address the problem. Goals of post-operative recovery were discussed as well. Possibility that this will not correct all symptoms was explained. I stressed the importance of low-impact activity, aggressive pain control, avoiding constipation, & not pushing through pain to minimize risk of post-operative chronic pain or injury. Possibility of reherniation especially with smoking, obesity, diabetes, immunosuppression, and other health conditions was discussed. We will work to minimize complications.  An educational handout further explaining the pathology & treatment options was given as well. Questions were answered. The patient expresses understanding & wishes to proceed with surgery.  Pt  Education - [facility] Hernia Post-Op HCI (Jillisa Harris): discussed with patient and provided information. Pt Education - [facility] Pain Control (Noemi Ishmael)  Cody Coleman, M.D., F.A.C.S. Gastrointestinal and Minimally Invasive Surgery [facility] [address], [address]  [place], [place] [address] [phone] Main / Paging

## 2016-12-08 NOTE — Progress Notes (Signed)
11-16-16  (EPIC) Cardiac clearance from Dr. Aundra Dubin  10-14-16 (EPIC) EKG, ECHO  06-17-16 (EPIC) CT Chest w/Contrast  11-25-15 [facility]) Stress

## 2016-12-08 NOTE — Patient Instructions (Signed)
Cody Coleman  12/08/2016   Your procedure is scheduled on: 12-14-16   Report to [facility] Main  Entrance Take [place] Elevators to 3rd floor to  [place] at 10:45 AM.   Call this number if you have problems the morning of surgery [phone]    Remember: ONLY 1 PERSON MAY GO WITH YOU TO SHORT STAY TO GET  READY MORNING OF [place].  Do not eat food or drink liquids :After Midnight.     Take these medicines the morning of surgery with A SIP OF WATER: Carvedilol (Coreg)                                You may not have any metal on your body including hair pins and              piercings  Do not wear jewelry, lotions, powders or perfumes, deodorant             Men may shave face and neck.   Do not bring valuables to the hospital. [place].  Contacts, dentures or bridgework may not be worn into surgery.  Leave suitcase in the car. After surgery it may be brought to your room.                Please read over the following fact sheets you were given: _____________________________________________________________________             [facility] - Preparing for Surgery Before surgery, you can play an important role.  Because skin is not sterile, your skin needs to be as free of germs as possible.  You can reduce the number of germs on your skin by washing with CHG (chlorahexidine gluconate) soap before surgery.  CHG is an antiseptic cleaner which kills germs and bonds with the skin to continue killing germs even after washing. Please DO NOT use if you have an allergy to CHG or antibacterial soaps.  If your skin becomes reddened/irritated stop using the CHG and inform your nurse when you arrive at Short Stay. Do not shave (including legs and underarms) for at least 48 hours prior to the first CHG shower.  You may shave your face/neck. Please follow these instructions carefully:  1.  Shower with CHG Soap  the night before surgery and the  morning of Surgery.  2.  If you choose to wash your hair, wash your hair first as usual with your  normal  shampoo.  3.  After you shampoo, rinse your hair and body thoroughly to remove the  shampoo.                           4.  Use CHG as you would any other liquid soap.  You can apply chg directly  to the skin and wash                       Gently with a scrungie or clean washcloth.  5.  Apply the CHG Soap to your body ONLY FROM THE NECK DOWN.   Do not use on face/ open  Wound or open sores. Avoid contact with eyes, ears mouth and genitals (private parts).                       Wash face,  Genitals (private parts) with your normal soap.             6.  Wash thoroughly, paying special attention to the area where your surgery  will be performed.  7.  Thoroughly rinse your body with warm water from the neck down.  8.  DO NOT shower/wash with your normal soap after using and rinsing off  the CHG Soap.                9.  Pat yourself dry with a clean towel.            10.  Wear clean pajamas.            11.  Place clean sheets on your bed the night of your first shower and do not  sleep with pets. Day of Surgery : Do not apply any lotions/deodorants the morning of surgery.  Please wear clean clothes to the hospital/surgery center.  FAILURE TO FOLLOW THESE INSTRUCTIONS MAY RESULT IN THE CANCELLATION OF YOUR SURGERY PATIENT SIGNATURE_________________________________  NURSE SIGNATURE__________________________________  ________________________________________________________________________

## 2016-12-09 ENCOUNTER — Encounter (HOSPITAL_COMMUNITY)
Admission: RE | Admit: 2016-12-09 | Discharge: 2016-12-09 | Disposition: A | Discharge status: Home / Self Care | Payer: Medicare Other | Source: Ambulatory Visit | Attending: Surgery | Admitting: Surgery

## 2016-12-09 ENCOUNTER — Other Ambulatory Visit: Payer: Self-pay

## 2016-12-09 ENCOUNTER — Encounter (HOSPITAL_COMMUNITY): Payer: Self-pay

## 2016-12-09 DIAGNOSIS — Z933 Colostomy status: Secondary | ICD-10-CM | POA: Insufficient documentation

## 2016-12-09 DIAGNOSIS — K469 Unspecified abdominal hernia without obstruction or gangrene: Secondary | ICD-10-CM | POA: Diagnosis not present

## 2016-12-09 DIAGNOSIS — Z01812 Encounter for preprocedural laboratory examination: Secondary | ICD-10-CM | POA: Insufficient documentation

## 2016-12-09 LAB — BASIC METABOLIC PANEL
Anion gap: 6 (ref 5–15)
BUN: 17 mg/dL (ref 6–20)
CHLORIDE: 106 mmol/L (ref 101–111)
CO2: 28 mmol/L (ref 22–32)
CREATININE: 0.76 mg/dL (ref 0.61–1.24)
Calcium: 9.2 mg/dL (ref 8.9–10.3)
GFR calc non Af Amer: >60 mL/min (ref >60)
GLUCOSE: 81 mg/dL (ref 65–99)
Potassium: 4.3 mmol/L (ref 3.5–5.1)
Sodium: 140 mmol/L (ref 135–145)

## 2016-12-09 LAB — CBC
HCT: 41.4 % (ref 39.0–52.0)
Hemoglobin: 14.1 g/dL (ref 13.0–17.0)
MCH: 30.1 pg (ref 26.0–34.0)
MCHC: 34.1 g/dL (ref 30.0–36.0)
MCV: 88.3 fL (ref 78.0–100.0)
PLATELETS: 128 10*3/uL — AB (ref 150–400)
RBC: 4.69 MIL/uL (ref 4.22–5.81)
RDW: 14.2 % (ref 11.5–15.5)
WBC: 6.5 10*3/uL (ref 4.0–10.5)

## 2016-12-09 NOTE — Progress Notes (Addendum)
12-09-16 CBC result routed to Dr. Johney Maine for review.

## 2016-12-14 ENCOUNTER — Encounter (HOSPITAL_COMMUNITY): Payer: Self-pay | Admitting: *Deleted

## 2016-12-14 ENCOUNTER — Encounter (HOSPITAL_COMMUNITY)
Admission: RE | Disposition: A | Discharge status: Home / Self Care | Payer: Self-pay | Source: Ambulatory Visit | Attending: Surgery

## 2016-12-14 ENCOUNTER — Ambulatory Visit (HOSPITAL_COMMUNITY): Payer: Medicare Other | Admitting: Certified Registered Nurse Anesthetist

## 2016-12-14 ENCOUNTER — Other Ambulatory Visit: Payer: Self-pay

## 2016-12-14 ENCOUNTER — Inpatient Hospital Stay (HOSPITAL_COMMUNITY)
Admission: RE | Admit: 2016-12-14 | Discharge: 2016-12-16 | DRG: 354 | Disposition: A | Discharge status: Home / Self Care | Payer: Medicare Other | Source: Ambulatory Visit | Attending: Surgery | Admitting: Surgery

## 2016-12-14 DIAGNOSIS — K430 Incisional hernia with obstruction, without gangrene: Secondary | ICD-10-CM | POA: Diagnosis present

## 2016-12-14 DIAGNOSIS — G8918 Other acute postprocedural pain: Secondary | ICD-10-CM | POA: Diagnosis not present

## 2016-12-14 DIAGNOSIS — I2510 Atherosclerotic heart disease of native coronary artery without angina pectoris: Secondary | ICD-10-CM | POA: Diagnosis not present

## 2016-12-14 DIAGNOSIS — K660 Peritoneal adhesions (postprocedural) (postinfection): Secondary | ICD-10-CM | POA: Diagnosis present

## 2016-12-14 DIAGNOSIS — Z955 Presence of coronary angioplasty implant and graft: Secondary | ICD-10-CM | POA: Diagnosis not present

## 2016-12-14 DIAGNOSIS — E7800 Pure hypercholesterolemia, unspecified: Secondary | ICD-10-CM | POA: Diagnosis present

## 2016-12-14 DIAGNOSIS — I5022 Chronic systolic (congestive) heart failure: Secondary | ICD-10-CM | POA: Diagnosis present

## 2016-12-14 DIAGNOSIS — Z87891 Personal history of nicotine dependence: Secondary | ICD-10-CM | POA: Diagnosis not present

## 2016-12-14 DIAGNOSIS — G479 Sleep disorder, unspecified: Secondary | ICD-10-CM | POA: Diagnosis present

## 2016-12-14 DIAGNOSIS — N400 Enlarged prostate without lower urinary tract symptoms: Secondary | ICD-10-CM | POA: Diagnosis present

## 2016-12-14 DIAGNOSIS — I4891 Unspecified atrial fibrillation: Secondary | ICD-10-CM | POA: Diagnosis not present

## 2016-12-14 DIAGNOSIS — Z85048 Personal history of other malignant neoplasm of rectum, rectosigmoid junction, and anus: Secondary | ICD-10-CM

## 2016-12-14 DIAGNOSIS — K435 Parastomal hernia without obstruction or  gangrene: Secondary | ICD-10-CM | POA: Diagnosis present

## 2016-12-14 DIAGNOSIS — Z7901 Long term (current) use of anticoagulants: Secondary | ICD-10-CM

## 2016-12-14 DIAGNOSIS — R001 Bradycardia, unspecified: Secondary | ICD-10-CM

## 2016-12-14 DIAGNOSIS — K5909 Other constipation: Secondary | ICD-10-CM | POA: Diagnosis present

## 2016-12-14 DIAGNOSIS — Z951 Presence of aortocoronary bypass graft: Secondary | ICD-10-CM

## 2016-12-14 DIAGNOSIS — Z933 Colostomy status: Secondary | ICD-10-CM | POA: Diagnosis not present

## 2016-12-14 DIAGNOSIS — I252 Old myocardial infarction: Secondary | ICD-10-CM | POA: Diagnosis not present

## 2016-12-14 DIAGNOSIS — K5900 Constipation, unspecified: Secondary | ICD-10-CM | POA: Diagnosis present

## 2016-12-14 DIAGNOSIS — C20 Malignant neoplasm of rectum: Secondary | ICD-10-CM | POA: Diagnosis present

## 2016-12-14 HISTORY — PX: PARASTOMAL HERNIA REPAIR: SHX2162

## 2016-12-14 HISTORY — DX: Unspecified atrial fibrillation: I48.91

## 2016-12-14 SURGERY — REPAIR, HERNIA, PARASTOMAL
Anesthesia: General | Site: Abdomen

## 2016-12-14 MED ORDER — GABAPENTIN 300 MG PO CAPS
300.0000 mg | ORAL_CAPSULE | Freq: Every day | ORAL | Status: DC
  Administered 2016-12-14 – 2016-12-15 (×2): 300 mg via ORAL
  Filled 2016-12-14 (×2): qty 1

## 2016-12-14 MED ORDER — MAGIC MOUTHWASH
15.0000 mL | Freq: Four times a day (QID) | ORAL | Status: DC | PRN
  Filled 2016-12-14: qty 15

## 2016-12-14 MED ORDER — BUPIVACAINE-EPINEPHRINE (PF) 0.25% -1:200000 IJ SOLN
INTRAMUSCULAR | Status: AC
  Filled 2016-12-14: qty 30

## 2016-12-14 MED ORDER — METHOCARBAMOL 500 MG PO TABS
500.0000 mg | ORAL_TABLET | Freq: Four times a day (QID) | ORAL | Status: DC | PRN
  Administered 2016-12-15: 500 mg via ORAL
  Filled 2016-12-14: qty 1

## 2016-12-14 MED ORDER — PHENYLEPHRINE 40 MCG/ML (10ML) SYRINGE FOR IV PUSH (FOR BLOOD PRESSURE SUPPORT)
PREFILLED_SYRINGE | INTRAVENOUS | Status: AC
  Filled 2016-12-14: qty 40

## 2016-12-14 MED ORDER — CARVEDILOL 3.125 MG PO TABS
3.1250 mg | ORAL_TABLET | Freq: Two times a day (BID) | ORAL | Status: DC
  Administered 2016-12-14 – 2016-12-16 (×4): 3.125 mg via ORAL
  Filled 2016-12-14 (×4): qty 1

## 2016-12-14 MED ORDER — SODIUM CHLORIDE 0.9 % IV SOLN
INTRAVENOUS | Status: DC
  Filled 2016-12-14: qty 6

## 2016-12-14 MED ORDER — BUPIVACAINE-EPINEPHRINE 0.25% -1:200000 IJ SOLN
INTRAMUSCULAR | Status: DC | PRN
  Administered 2016-12-14: 70 mL

## 2016-12-14 MED ORDER — SUCCINYLCHOLINE CHLORIDE 200 MG/10ML IV SOSY
PREFILLED_SYRINGE | INTRAVENOUS | Status: AC
  Filled 2016-12-14: qty 10

## 2016-12-14 MED ORDER — SUCCINYLCHOLINE CHLORIDE 200 MG/10ML IV SOSY
PREFILLED_SYRINGE | INTRAVENOUS | Status: DC | PRN
  Administered 2016-12-14: 100 mg via INTRAVENOUS

## 2016-12-14 MED ORDER — ROCURONIUM BROMIDE 50 MG/5ML IV SOSY
PREFILLED_SYRINGE | INTRAVENOUS | Status: DC | PRN
Start: 2016-12-14 — End: 2016-12-14
  Administered 2016-12-14: 40 mg via INTRAVENOUS
  Administered 2016-12-14 (×4): 10 mg via INTRAVENOUS

## 2016-12-14 MED ORDER — LIP MEDEX EX OINT
1.0000 "application " | TOPICAL_OINTMENT | Freq: Two times a day (BID) | CUTANEOUS | Status: DC
  Administered 2016-12-15 (×2): 1 via TOPICAL
  Filled 2016-12-14: qty 7

## 2016-12-14 MED ORDER — POLYETHYLENE GLYCOL 3350 17 G PO PACK
17.0000 g | PACK | Freq: Every day | ORAL | Status: DC | PRN
  Administered 2016-12-14: 17 g via ORAL
  Filled 2016-12-14: qty 1

## 2016-12-14 MED ORDER — BISACODYL 10 MG RE SUPP: 10.0000 mg | Freq: Every day | RECTAL | Status: DC | PRN

## 2016-12-14 MED ORDER — ROCURONIUM BROMIDE 50 MG/5ML IV SOSY
PREFILLED_SYRINGE | INTRAVENOUS | Status: AC
  Filled 2016-12-14: qty 5

## 2016-12-14 MED ORDER — DIPHENHYDRAMINE HCL 12.5 MG/5ML PO ELIX: 12.5000 mg | ORAL_SOLUTION | Freq: Four times a day (QID) | ORAL | Status: DC | PRN

## 2016-12-14 MED ORDER — BUPIVACAINE-EPINEPHRINE 0.25% -1:200000 IJ SOLN
INTRAMUSCULAR | Status: AC
  Filled 2016-12-14: qty 1

## 2016-12-14 MED ORDER — LIDOCAINE 2% (20 MG/ML) 5 ML SYRINGE
INTRAMUSCULAR | Status: AC
  Filled 2016-12-14: qty 5

## 2016-12-14 MED ORDER — HYDROCORTISONE 1 % EX CREA: 1.0000 "application " | TOPICAL_CREAM | Freq: Three times a day (TID) | CUTANEOUS | Status: DC | PRN

## 2016-12-14 MED ORDER — TAMSULOSIN HCL 0.4 MG PO CAPS
0.4000 mg | ORAL_CAPSULE | Freq: Every day | ORAL | Status: DC
  Administered 2016-12-15 – 2016-12-16 (×2): 0.4 mg via ORAL
  Filled 2016-12-14 (×2): qty 1

## 2016-12-14 MED ORDER — PROMETHAZINE HCL 25 MG/ML IJ SOLN: 6.2500 mg | INTRAMUSCULAR | Status: DC | PRN

## 2016-12-14 MED ORDER — FENTANYL CITRATE (PF) 100 MCG/2ML IJ SOLN: 25.0000 ug | INTRAMUSCULAR | Status: DC | PRN

## 2016-12-14 MED ORDER — BUPIVACAINE LIPOSOME 1.3 % IJ SUSP
INTRAMUSCULAR | Status: DC | PRN
  Administered 2016-12-14: 20 mL

## 2016-12-14 MED ORDER — GENTAMICIN SULFATE 40 MG/ML IJ SOLN
5.0000 mg/kg | INTRAVENOUS | Status: AC
  Administered 2016-12-14: 350 mg via INTRAVENOUS
  Filled 2016-12-14 (×3): qty 8.75

## 2016-12-14 MED ORDER — DIPHENHYDRAMINE HCL 50 MG/ML IJ SOLN: 12.5000 mg | Freq: Four times a day (QID) | INTRAMUSCULAR | Status: DC | PRN

## 2016-12-14 MED ORDER — HYDRALAZINE HCL 20 MG/ML IJ SOLN: 5.0000 mg | INTRAMUSCULAR | Status: DC | PRN

## 2016-12-14 MED ORDER — ONDANSETRON HCL 4 MG/2ML IJ SOLN
INTRAMUSCULAR | Status: AC
  Filled 2016-12-14: qty 2

## 2016-12-14 MED ORDER — STERILE WATER FOR IRRIGATION IR SOLN
Status: DC | PRN
  Administered 2016-12-14: 1000 mL

## 2016-12-14 MED ORDER — FENTANYL CITRATE (PF) 100 MCG/2ML IJ SOLN
100.0000 ug | Freq: Once | INTRAMUSCULAR | Status: AC
  Administered 2016-12-14 (×2): 50 ug via INTRAVENOUS

## 2016-12-14 MED ORDER — ONDANSETRON 4 MG PO TBDP: 4.0000 mg | ORAL_TABLET | Freq: Four times a day (QID) | ORAL | Status: DC | PRN

## 2016-12-14 MED ORDER — LACTATED RINGERS IV SOLN
INTRAVENOUS | Status: DC
  Administered 2016-12-14 (×2): via INTRAVENOUS

## 2016-12-14 MED ORDER — MIDAZOLAM HCL 2 MG/2ML IJ SOLN
2.0000 mg | Freq: Once | INTRAMUSCULAR | Status: AC
  Administered 2016-12-14: 2 mg via INTRAVENOUS

## 2016-12-14 MED ORDER — LACTATED RINGERS IV BOLUS (SEPSIS): 1000.0000 mL | Freq: Three times a day (TID) | INTRAVENOUS | Status: DC | PRN

## 2016-12-14 MED ORDER — PROPOFOL 10 MG/ML IV BOLUS
INTRAVENOUS | Status: AC
  Filled 2016-12-14: qty 20

## 2016-12-14 MED ORDER — LISINOPRIL 5 MG PO TABS
2.5000 mg | ORAL_TABLET | Freq: Every day | ORAL | Status: DC
  Administered 2016-12-14 – 2016-12-15 (×2): 2.5 mg via ORAL
  Filled 2016-12-14 (×2): qty 1

## 2016-12-14 MED ORDER — ONDANSETRON HCL 4 MG/2ML IJ SOLN
INTRAMUSCULAR | Status: DC | PRN
  Administered 2016-12-14: 4 mg via INTRAVENOUS

## 2016-12-14 MED ORDER — FENTANYL CITRATE (PF) 100 MCG/2ML IJ SOLN
INTRAMUSCULAR | Status: DC | PRN
  Administered 2016-12-14 (×4): 50 ug via INTRAVENOUS

## 2016-12-14 MED ORDER — FENTANYL CITRATE (PF) 250 MCG/5ML IJ SOLN
INTRAMUSCULAR | Status: AC
Start: 2016-12-14 — End: 2016-12-14
  Filled 2016-12-14: qty 5

## 2016-12-14 MED ORDER — ALUM & MAG HYDROXIDE-SIMETH 200-200-20 MG/5ML PO SUSP: 30.0000 mL | Freq: Four times a day (QID) | ORAL | Status: DC | PRN

## 2016-12-14 MED ORDER — FENTANYL CITRATE (PF) 100 MCG/2ML IJ SOLN
INTRAMUSCULAR | Status: AC
  Administered 2016-12-14: 50 ug via INTRAVENOUS
  Filled 2016-12-14: qty 2

## 2016-12-14 MED ORDER — MIDAZOLAM HCL 2 MG/2ML IJ SOLN
INTRAMUSCULAR | Status: AC
  Administered 2016-12-14: 2 mg via INTRAVENOUS
  Filled 2016-12-14: qty 2

## 2016-12-14 MED ORDER — BUPIVACAINE LIPOSOME 1.3 % IJ SUSP
20.0000 mL | INTRAMUSCULAR | Status: DC
  Filled 2016-12-14: qty 20

## 2016-12-14 MED ORDER — KETAMINE HCL-SODIUM CHLORIDE 100-0.9 MG/10ML-% IV SOSY
PREFILLED_SYRINGE | INTRAVENOUS | Status: AC
  Filled 2016-12-14: qty 10

## 2016-12-14 MED ORDER — LIDOCAINE HCL 2 % IJ SOLN
INTRAMUSCULAR | Status: AC
  Filled 2016-12-14: qty 20

## 2016-12-14 MED ORDER — SIMETHICONE 80 MG PO CHEW: 40.0000 mg | CHEWABLE_TABLET | Freq: Four times a day (QID) | ORAL | Status: DC | PRN

## 2016-12-14 MED ORDER — DEXAMETHASONE SODIUM PHOSPHATE 10 MG/ML IJ SOLN
INTRAMUSCULAR | Status: DC | PRN
  Administered 2016-12-14: 7 mg via INTRAVENOUS

## 2016-12-14 MED ORDER — PHENOL 1.4 % MT LIQD: 1.0000 | OROMUCOSAL | Status: DC | PRN

## 2016-12-14 MED ORDER — KETAMINE HCL 10 MG/ML IJ SOLN
INTRAMUSCULAR | Status: DC | PRN
  Administered 2016-12-14: 30 mg via INTRAVENOUS

## 2016-12-14 MED ORDER — SUGAMMADEX SODIUM 200 MG/2ML IV SOLN
INTRAVENOUS | Status: DC | PRN
  Administered 2016-12-14: 200 mg via INTRAVENOUS

## 2016-12-14 MED ORDER — METOCLOPRAMIDE HCL 5 MG/ML IJ SOLN: 5.0000 mg | Freq: Four times a day (QID) | INTRAMUSCULAR | Status: DC | PRN

## 2016-12-14 MED ORDER — ENOXAPARIN SODIUM 40 MG/0.4ML ~~LOC~~ SOLN
40.0000 mg | SUBCUTANEOUS | Status: DC
  Filled 2016-12-14: qty 0.4

## 2016-12-14 MED ORDER — ACETAMINOPHEN 500 MG PO TABS
1000.0000 mg | ORAL_TABLET | ORAL | Status: AC
  Administered 2016-12-14: 1000 mg via ORAL
  Filled 2016-12-14: qty 2

## 2016-12-14 MED ORDER — CLINDAMYCIN PHOSPHATE 900 MG/50ML IV SOLN
900.0000 mg | INTRAVENOUS | Status: AC
  Administered 2016-12-14: 900 mg via INTRAVENOUS
  Filled 2016-12-14: qty 50

## 2016-12-14 MED ORDER — BUPIVACAINE HCL (PF) 0.25 % IJ SOLN
INTRAMUSCULAR | Status: DC | PRN
  Administered 2016-12-14: 60 mL via EPIDURAL

## 2016-12-14 MED ORDER — SODIUM CHLORIDE 0.9 % IV SOLN
INTRAVENOUS | Status: DC
  Administered 2016-12-14: 18:00:00 via INTRAVENOUS

## 2016-12-14 MED ORDER — PROPOFOL 10 MG/ML IV BOLUS
INTRAVENOUS | Status: DC | PRN
  Administered 2016-12-14: 130 mg via INTRAVENOUS

## 2016-12-14 MED ORDER — MENTHOL 3 MG MT LOZG: 1.0000 | LOZENGE | OROMUCOSAL | Status: DC | PRN

## 2016-12-14 MED ORDER — DEXAMETHASONE SODIUM PHOSPHATE 10 MG/ML IJ SOLN
INTRAMUSCULAR | Status: AC
  Filled 2016-12-14: qty 1

## 2016-12-14 MED ORDER — HYDROMORPHONE HCL 1 MG/ML IJ SOLN
0.5000 mg | INTRAMUSCULAR | Status: DC | PRN
  Administered 2016-12-14: 0.5 mg via INTRAVENOUS
  Filled 2016-12-14: qty 1

## 2016-12-14 MED ORDER — LACTATED RINGERS IV SOLN: 1000.0000 mL | Freq: Three times a day (TID) | INTRAVENOUS | Status: DC | PRN

## 2016-12-14 MED ORDER — ATORVASTATIN CALCIUM 40 MG PO TABS
40.0000 mg | ORAL_TABLET | Freq: Every day | ORAL | Status: DC
  Administered 2016-12-15 – 2016-12-16 (×2): 40 mg via ORAL
  Filled 2016-12-14 (×2): qty 1

## 2016-12-14 MED ORDER — GUAIFENESIN-DM 100-10 MG/5ML PO SYRP: 10.0000 mL | ORAL_SOLUTION | ORAL | Status: DC | PRN

## 2016-12-14 MED ORDER — ACETAMINOPHEN 500 MG PO TABS
1000.0000 mg | ORAL_TABLET | Freq: Three times a day (TID) | ORAL | Status: DC
  Administered 2016-12-14 – 2016-12-16 (×5): 1000 mg via ORAL
  Filled 2016-12-14 (×5): qty 2

## 2016-12-14 MED ORDER — SUGAMMADEX SODIUM 200 MG/2ML IV SOLN
INTRAVENOUS | Status: AC
  Filled 2016-12-14: qty 2

## 2016-12-14 MED ORDER — 0.9 % SODIUM CHLORIDE (POUR BTL) OPTIME
TOPICAL | Status: DC | PRN
  Administered 2016-12-14: 1000 mL

## 2016-12-14 MED ORDER — OXYCODONE HCL 5 MG PO TABS
5.0000 mg | ORAL_TABLET | ORAL | Status: DC | PRN
  Administered 2016-12-15 – 2016-12-16 (×4): 5 mg via ORAL
  Filled 2016-12-14: qty 1
  Filled 2016-12-14: qty 2
  Filled 2016-12-14 (×2): qty 1

## 2016-12-14 MED ORDER — PHENYLEPHRINE 40 MCG/ML (10ML) SYRINGE FOR IV PUSH (FOR BLOOD PRESSURE SUPPORT)
PREFILLED_SYRINGE | INTRAVENOUS | Status: DC | PRN
  Administered 2016-12-14 (×2): 40 ug via INTRAVENOUS
  Administered 2016-12-14: 80 ug via INTRAVENOUS
  Administered 2016-12-14: 40 ug via INTRAVENOUS
  Administered 2016-12-14: 80 ug via INTRAVENOUS

## 2016-12-14 MED ORDER — ONDANSETRON HCL 4 MG/2ML IJ SOLN: 4.0000 mg | Freq: Four times a day (QID) | INTRAMUSCULAR | Status: DC | PRN

## 2016-12-14 MED ORDER — ALPRAZOLAM 1 MG PO TABS: 1.0000 mg | ORAL_TABLET | Freq: Four times a day (QID) | ORAL | Status: DC | PRN

## 2016-12-14 MED ORDER — HYDROCORTISONE 2.5 % RE CREA: 1.0000 "application " | TOPICAL_CREAM | Freq: Four times a day (QID) | RECTAL | Status: DC | PRN

## 2016-12-14 MED ORDER — LIDOCAINE 2% (20 MG/ML) 5 ML SYRINGE
INTRAMUSCULAR | Status: DC | PRN
  Administered 2016-12-14: 70 mg via INTRAVENOUS

## 2016-12-14 MED ORDER — GABAPENTIN 300 MG PO CAPS
300.0000 mg | ORAL_CAPSULE | ORAL | Status: AC
  Administered 2016-12-14: 300 mg via ORAL
  Filled 2016-12-14: qty 1

## 2016-12-14 MED ORDER — KETOROLAC TROMETHAMINE 30 MG/ML IJ SOLN: 30.0000 mg | Freq: Once | INTRAMUSCULAR | Status: DC | PRN

## 2016-12-14 SURGICAL SUPPLY — 43 items
APPLIER CLIP 5 13 M/L LIGAMAX5 (MISCELLANEOUS)
APR CLP MED LRG 5 ANG JAW (MISCELLANEOUS)
BINDER ABDOMINAL 12 ML 46-62 (SOFTGOODS) ×2 IMPLANT
CABLE HIGH FREQUENCY MONO STRZ (ELECTRODE) ×5 IMPLANT
CHLORAPREP W/TINT 26ML (MISCELLANEOUS) ×3 IMPLANT
CLIP APPLIE 5 13 M/L LIGAMAX5 (MISCELLANEOUS) IMPLANT
CLOSURE WOUND 1/2 X4 (GAUZE/BANDAGES/DRESSINGS) ×1
COVER SURGICAL LIGHT HANDLE (MISCELLANEOUS) ×3 IMPLANT
DECANTER SPIKE VIAL GLASS SM (MISCELLANEOUS) ×3 IMPLANT
DEVICE SECURE STRAP 25 ABSORB (INSTRUMENTS) ×2 IMPLANT
DEVICE TROCAR PUNCTURE CLOSURE (ENDOMECHANICALS) ×3 IMPLANT
DRAPE WARM FLUID 44X44 (DRAPE) ×3 IMPLANT
DRSG TEGADERM 2-3/8X2-3/4 SM (GAUZE/BANDAGES/DRESSINGS) ×5 IMPLANT
DRSG TEGADERM 4X4.75 (GAUZE/BANDAGES/DRESSINGS) ×2 IMPLANT
ELECT REM PT RETURN 15FT ADLT (MISCELLANEOUS) ×3 IMPLANT
GAUZE SPONGE 2X2 8PLY STRL LF (GAUZE/BANDAGES/DRESSINGS) IMPLANT
GLOVE ECLIPSE 8.0 STRL XLNG CF (GLOVE) ×3 IMPLANT
GLOVE INDICATOR 8.0 STRL GRN (GLOVE) ×3 IMPLANT
GOWN STRL REUS W/TWL XL LVL3 (GOWN DISPOSABLE) ×6 IMPLANT
IRRIG SUCT STRYKERFLOW 2 WTIP (MISCELLANEOUS) ×3
IRRIGATION SUCT STRKRFLW 2 WTP (MISCELLANEOUS) IMPLANT
KIT BASIN OR (CUSTOM PROCEDURE TRAY) ×3 IMPLANT
MARKER SKIN DUAL TIP RULER LAB (MISCELLANEOUS) ×3 IMPLANT
MESH PHASIX RESORB RECT 15X20 (Mesh General) ×2 IMPLANT
MESH VENTRALIGHT ST 8X10 (Mesh General) ×2 IMPLANT
NDL SPNL 22GX3.5 QUINCKE BK (NEEDLE) IMPLANT
NEEDLE SPNL 22GX3.5 QUINCKE BK (NEEDLE) ×3 IMPLANT
PAD POSITIONING PINK XL (MISCELLANEOUS) ×3 IMPLANT
SCISSORS LAP 5X35 DISP (ENDOMECHANICALS) ×5 IMPLANT
SHEARS HARMONIC ACE PLUS 36CM (ENDOMECHANICALS) IMPLANT
SLEEVE ADV FIXATION 5X100MM (TROCAR) ×7 IMPLANT
SPONGE GAUZE 2X2 STER 10/PKG (GAUZE/BANDAGES/DRESSINGS) ×2
STRIP CLOSURE SKIN 1/2X4 (GAUZE/BANDAGES/DRESSINGS) ×3 IMPLANT
SUT MNCRL AB 4-0 PS2 18 (SUTURE) ×5 IMPLANT
SUT PDS AB 1 CT1 27 (SUTURE) ×18 IMPLANT
SUT PROLENE 1 CT 1 30 (SUTURE) ×22 IMPLANT
SYRINGE 20CC LL (MISCELLANEOUS) ×2 IMPLANT
TOWEL OR 17X26 10 PK STRL BLUE (TOWEL DISPOSABLE) ×3 IMPLANT
TRAY LAPAROSCOPIC (CUSTOM PROCEDURE TRAY) ×3 IMPLANT
TROCAR ADV FIXATION 11X100MM (TROCAR) IMPLANT
TROCAR ADV FIXATION 5X100MM (TROCAR) ×3 IMPLANT
TROCAR BLADELESS OPT 5 100 (ENDOMECHANICALS) ×3 IMPLANT
TUBING INSUF HEATED (TUBING) ×3 IMPLANT

## 2016-12-14 NOTE — Anesthesia Preprocedure Evaluation (Signed)
Anesthesia Evaluation  Patient identified by MRN, date of birth, ID band Patient awake    Reviewed: Allergy & Precautions, NPO status , Patient's Chart, lab work & pertinent test results  Airway Mallampati: II  TM Distance: >3 FB Neck ROM: Full    Dental no notable dental hx.    Pulmonary neg pulmonary ROS, former smoker,    Pulmonary exam normal breath sounds clear to auscultation       Cardiovascular + CAD, + Past MI and + CABG  + dysrhythmias Atrial Fibrillation  Rhythm:Irregular Rate:Normal  - Left ventricle: The cavity size was at the upper limits of   normal. Wall thickness was normal. Basal to mid anterolateral and   inferolateral hypokinesis. Basal to mid inferior akinesis.   Indeterminant diastolic function (atrial fibrillation). The   estimated ejection fraction was 40%. - Aortic valve: There was no stenosis. - Mitral valve: There was trivial regurgitation. - Left atrium: The atrium was moderately dilated. - Right ventricle: The cavity size was normal. Systolic function   was mildly reduced. - Right atrium: The atrium was moderately dilated. - Pulmonary arteries: No complete TR doppler jet so unable to   estimate PA systolic pressure. - Systemic veins: IVC measured 2.1 cm with > 50% respirophasic   variation, suggesting RA pressure 8 mmHg.  Impressions:  - Upper normal LV size with EF 40%, wall motion abnormalities as   above. Normal RV size with mildly decreased systolic function. No   significant valvular abnormalities. Moderate biatrial   enlargement.    Neuro/Psych negative neurological ROS  negative psych ROS   GI/Hepatic negative GI ROS, Neg liver ROS,   Endo/Other  negative endocrine ROS  Renal/GU negative Renal ROS  negative genitourinary   Musculoskeletal negative musculoskeletal ROS (+)   Abdominal   Peds negative pediatric ROS (+)  Hematology negative hematology ROS (+)    Anesthesia Other Findings   Reproductive/Obstetrics negative OB ROS                             Anesthesia Physical Anesthesia Plan  ASA: III  Anesthesia Plan: General   Post-op Pain Management:  Regional for Post-op pain   Induction: Intravenous  PONV Risk Score and Plan: 2 and Ondansetron and Dexamethasone  Airway Management Planned: Oral ETT  Additional Equipment:   Intra-op Plan:   Post-operative Plan: Extubation in OR  Informed Consent: I have reviewed the patients History and Physical, chart, labs and discussed the procedure including the risks, benefits and alternatives for the proposed anesthesia with the patient or authorized representative who has indicated his/her understanding and acceptance.   Dental advisory given  Plan Discussed with: CRNA and Surgeon  Anesthesia Plan Comments:         Anesthesia Quick Evaluation

## 2016-12-14 NOTE — Transfer of Care (Signed)
Immediate Anesthesia Transfer of Care Note  Patient: Cody Coleman  Procedure(s) Performed: PREPARE OF RECURRENT PERISTOMAL HERNIA WITH MESH WITH LAPAROSCOPIC LYSIS OF ADHESIONS, AND INCISIONAL HERNIA REPAIR WITH MESH (N/A Abdomen)  Patient Location: PACU  Anesthesia Type:General  Level of Consciousness: sedated and patient cooperative  Airway & Oxygen Therapy: Patient Spontanous Breathing and Patient connected to face mask oxygen  Post-op Assessment: Report given to RN and Post -op Vital signs reviewed and stable  Post vital signs: Reviewed and stable  Last Vitals:  Vitals:   12/14/16 1152 12/14/16 1153  BP:    Pulse: (!) 58 63  Resp: 13   Temp:    SpO2: 100% 100%    Last Pain:  Vitals:   12/14/16 1046  TempSrc: Oral      Patients Stated Pain Goal: 4 (28/00/34 9179)  Complications: No apparent anesthesia complications

## 2016-12-14 NOTE — Anesthesia Procedure Notes (Signed)
Procedure Name: Intubation Date/Time: 12/14/2016 12:29 PM Performed by: Montel Clock, CRNA Pre-anesthesia Checklist: Patient identified, Emergency Drugs available, Suction available, Patient being monitored and Timeout performed Patient Re-evaluated:Patient Re-evaluated prior to induction Oxygen Delivery Method: Circle system utilized Preoxygenation: Pre-oxygenation with 100% oxygen Induction Type: IV induction Laryngoscope Size: Mac and 3 Grade View: Grade II Tube type: Oral Tube size: 7.5 mm Number of attempts: 1 Airway Equipment and Method: Stylet Placement Confirmation: ETT inserted through vocal cords under direct vision,  positive ETCO2 and breath sounds checked- equal and bilateral Secured at: 23 cm Tube secured with: Tape Dental Injury: Teeth and Oropharynx as per pre-operative assessment

## 2016-12-14 NOTE — Anesthesia Procedure Notes (Signed)
Anesthesia Regional Block: TAP block   Pre-Anesthetic Checklist: ,, timeout performed, Correct Patient, Correct Site, Correct Laterality, Correct Procedure, Correct Position, site marked, Risks and benefits discussed,  Surgical consent,  Pre-op evaluation,  At surgeon's request and post-op pain management  Laterality: Left and Right  Prep: chloraprep       Needles:  Injection technique: Single-shot  Needle Type: Echogenic Needle     Needle Length: 9cm      Additional Needles:   Procedures:,,,, ultrasound used (permanent image in chart),,,,  Narrative:  Start time: 12/14/2016 11:40 AM End time: 12/14/2016 11:52 AM Injection made incrementally with aspirations every 5 mL.  Performed by: Personally  Anesthesiologist: Myrtie Soman, MD  Additional Notes: Patient tolerated the procedure well without complications

## 2016-12-14 NOTE — Anesthesia Postprocedure Evaluation (Signed)
Anesthesia Post Note  Patient: AMAREE LEEPER  Procedure(s) Performed: PREPARE OF RECURRENT PERISTOMAL HERNIA WITH MESH WITH LAPAROSCOPIC LYSIS OF ADHESIONS, AND INCISIONAL HERNIA REPAIR WITH MESH (N/A Abdomen)     Patient location during evaluation: PACU Anesthesia Type: General Level of consciousness: awake and alert Pain management: pain level controlled Vital Signs Assessment: post-procedure vital signs reviewed and stable Respiratory status: spontaneous breathing, nonlabored ventilation, respiratory function stable and patient connected to nasal cannula oxygen Cardiovascular status: blood pressure returned to baseline and stable Postop Assessment: no apparent nausea or vomiting Anesthetic complications: no    Last Vitals:  Vitals:   12/14/16 1700 12/14/16 1716  BP: 107/68 119/71  Pulse: 67 67  Resp: 12 12  Temp: 36.6 C (!) 36.4 C  SpO2: 97% 97%    Last Pain:  Vitals:   12/14/16 1758  TempSrc:   PainSc: 5                  Kayven Aldaco S

## 2016-12-14 NOTE — Anesthesia Procedure Notes (Signed)
Anesthesia Procedure Image    

## 2016-12-14 NOTE — Op Note (Signed)
12/14/2016  PATIENT:  Cody Coleman  71 y.o. male  Patient Care Team: Redmond School, MD as PCP - General (Internal Medicine) Leighton Ruff, MD as Consulting Physician (General Surgery) Michael Boston, MD as Consulting Physician (General Surgery) Larey Dresser, MD as Consulting Physician (Cardiology) Elsie Lincoln, MD as Consulting Physician (Family Medicine) Rogene Houston, MD as Consulting Physician (Gastroenterology)  PRE-OPERATIVE DIAGNOSIS:  RECURRENT PARASTOMAL HERNIA   POST-OPERATIVE DIAGNOSIS:    RECURRENT PARASTOMAL HERNIA  INCISIONAL HERNIAS (incarcerated, recurrent)  PROCEDURE:   REPAIR OF RECURRENT PERISTOMAL HERNIA WITH MESH LAPAROSCOPIC INCISIONAL HERNIA REPAIR WITH MESH LAPAROSCOPIC LYSIS OF ADHESIONS   SURGEON:  [name], MD  ASSISTANT: Nurse   ANESTHESIA:     General TAP block Local anesthesia field block: (0.25% bupivacaine &  liposomal  Bupivacaine [Experel])  EBL:  Total I/O In: 1000 [I.V.:1000] Out: 20 [Blood:20]  Per anesthesia record  Delay start of Pharmacological VTE agent (>24hrs) due to surgical blood loss or risk of bleeding:  no  DRAINS: none   SPECIMEN:  No Specimen  DISPOSITION OF SPECIMEN:  N/A  COUNTS:  YES  PLAN OF CARE: Admit for overnight observation  PATIENT DISPOSITION:  PACU - hemodynamically stable.  INDICATION: Pleasant patient has developed a ventral wall abdominal hernia.   Recommendation was made for surgical repair:  The anatomy & physiology of the abdominal wall was discussed. The pathophysiology of hernias was discussed. Natural history risks without surgery including progeressive enlargement, pain, incarceration & strangulation was discussed. Contributors to complications such as smoking, obesity, diabetes, prior surgery, etc were discussed.  I feel the risks of no intervention will lead to serious problems that outweigh the operative risks; therefore, I recommended surgery to reduce and repair  the hernia. I explained laparoscopic techniques with possible need for an open approach. I noted the probable use of mesh to patch and/or buttress the hernia repair  Risks such as bleeding, infection, abscess, need for further treatment, heart attack, death, and other risks were discussed. I noted a good likelihood this will help address the problem. Goals of post-operative recovery were discussed as well. Possibility that this will not correct all symptoms was explained. I stressed the importance of low-impact activity, aggressive pain control, avoiding constipation, & not pushing through pain to minimize risk of post-operative chronic pain or injury. Possibility of reherniation especially with smoking, obesity, diabetes, immunosuppression, and other health conditions was discussed. We will work to minimize complications.  An educational handout further explaining the pathology & treatment options was given as well. Questions were answered. The patient expresses understanding & wishes to proceed with surgery.   OR FINDINGS: Recurrent parastomal hernia with omentum incarcerated in it.  Moderate size.  No small bowel.  No necrosis.  Primarily repaired medially and reinforced repair with repaired with Phasix (Phasix Mesh (a knitted monofilament mesh scaffold using Poly-4-hydroxybutyrate (P4HB), a biologically derived, fully resorbable material).  20 x 15 cm sheet underlay with a 5-7 centimeter overlap.  It is a Soil scientist type of repair with the descending colon going from the left mid flank along the anterior parietal peritoneum and out the colostomy.  absorbable mesh and permanent mesh  5 x 2 supraumbilical Swiss cheese hernias.  Repair primarily and covered with permanent mesh.  Type of repair: Laparoscopic underlay repair   Placement of mesh: Intraperitoneal underlay repair  Name of mesh: Bard Ventralight dual sided (polypropylene / Seprafilm)  Size of mesh: 25x20cm  Orientation:  Oblique   overlap:  5-7cm  DESCRIPTION:   Informed consent was confirmed. The patient underwent general anaesthesia without difficulty. The patient was positioned appropriately. VTE prevention in place.  Colostomy intubated with Foley balloon.  the patient's abdomen was clipped, prepped, & draped in a sterile fashion.  Colostomy protected with gauze and Ioban.  Foley allowed to lay laterally.  Surgical timeout confirmed our plan.  The patient was positioned in reverse Trendelenburg. Abdominal entry was gained using optical entry technique in the left upper abdomen. Entry was clean. I induced carbon dioxide insufflation. Camera inspection revealed no injury. Extra ports were carefully placed under direct laparoscopic visualization.   I could see adhesions on the parietal peritoneum under the abdominal wall.  I did laparoscopic lysis of adhesions to expose the entire anterior abdominal wall.  I primarily used focused sharp dissection.   Treated the greater omentum off the anterior abdominal wall and reduced and removed and out of the left lower quadrant paracolostomy parastomal hernia.  In doing this I reduced some Swiss cheese hernias supraumbilically.  5 x 2 cm region.  I freed the greater omentum and small bowel off the mesentery of the left colon to allow it to stretch and lay towards the left anterior flank.  I made sure hemostasis was good.    I primarily repaired the parastomal hernia medially with #1 interrupted PDS suture x4 have reduced insufflation.  That helped have a more snug closure around the colostomy.  To help re-enforced to repair used a 20 x 15 Phasix mesh.  I placed #1 stitches x7 around two thirds of the edge.  Placed it through the largest supraumbilical hernia.  I rolled it flat.  Positioned it obliquely to have overlap of at least 5 cm around the stoma.  I brought up the #1 PDS sutures with a laparoscopic needle driver starting laterally and then medially.  Placed a couple #1 PDS  sutures along the left lateral edge to help hammock the colon up to run along the anterior abdominal wall and exit laterally towards the left lateral flank in the classic [place]aker fashion.  I made sure that the mesh was snug but not tight laterally.  I mapped out the region using a needle passer.   To ensure that I would have at least 5 cm radial coverage outside of the incisional and parastomal hernia defects, I chose a 25x20cm dual sided Ventralight mesh.  I placed #1 Prolene stitches around its edge about every 5 cm.  At least a few centrally as well I rolled the mesh & placed into the peritoneal cavity through the supraumbilical larger incisional defect.  I unrolled the mesh and positioned it appropriately.  We did obliquely from the right upper quadrant towards the left anterior hip.  That way covered up most of the Phasix mesh and as well as the supraumbilical hernias  I secured the mesh to cover up the hernia defect using a laparoscopic suture passer to pass the tails of the Prolene through the abdominal wall & tagged them with clamps for good transfascial suturing.  I started out in four corners to make sure I had the mesh centered under the hernia defect appropriately, and then proceeded to work in quadrants.  I also brought sutures in the center part of the mesh to come up through the Phasix mesh just superiorly and inferiorly to the colostomy as well with a good several centimeter edge.  That help secure the mesh together centrally.  We evacuated CO2 & desufflated the abdomen.  I tied the fascial stitches down. I closed the fascial defect that I placed the mesh through using #1 PDS interrupted transverse stitches primarily.  I reinsufflated the abdomen. The mesh provided at least circumferential coverage around the entire region of hernia defects.   I secured the mesh centrally with an additional trans fascial stitch in & out the mesh using #1 PDS under laparoscopic visualization.   I tacked the  edges & central part of the mesh to the peritoneum/posterior rectus fascia with SecureStrap absorbable tacks.   I did reinspection. Hemostasis was good. Mesh laid well. I completed a broad field block of local anesthesia at fascial stitch sites & fascial closure areas.    Capnoperitoneum was evacuated. Ports were removed. The skin was closed with Monocryl at the port sites and Steri-Strips on the fascial stitch puncture sites.  Patient is being extubated to go to the recovery room.  I discussed operative findings, updated the patient's status, discussed probable steps to recovery, and gave postoperative recommendations to the patient's family.  Recommendations were made.  Questions were answered.  They expressed understanding & appreciation.  [name], M.D., F.A.C.S. Gastrointestinal and Minimally Invasive Surgery [facility] [address], [place], [place] [address] [phone] Main / Paging  12/14/2016 3:45 PM

## 2016-12-14 NOTE — Discharge Instructions (Signed)
HERNIA REPAIR: POST OP INSTRUCTIONS ° °###################################################################### ° °EAT °Gradually transition to a high fiber diet with a fiber supplement over the next few weeks after discharge.  Start with a pureed / full liquid diet (see below) ° °WALK °Walk an hour a day.  Control your pain to do that.   ° °CONTROL PAIN °Control pain so that you can walk, sleep, tolerate sneezing/coughing, go up/down stairs. ° °HAVE A BOWEL MOVEMENT DAILY °Keep your bowels regular to avoid problems.  OK to try a laxative to override constipation.  OK to use an antidairrheal to slow down diarrhea.  Call if not better after 2 tries ° °CALL IF YOU HAVE PROBLEMS/CONCERNS °Call if you are still struggling despite following these instructions. °Call if you have concerns not answered by these instructions ° °###################################################################### ° ° ° °1. DIET: Follow a light bland diet the first 24 hours after arrival home, such as soup, liquids, crackers, etc.  Be sure to include lots of fluids daily.  Avoid fast food or heavy meals as your are more likely to get nauseated.  Eat a low fat the next few days after surgery. °2. Take your usually prescribed home medications unless otherwise directed. °3. PAIN CONTROL: °a. Pain is best controlled by a usual combination of three different methods TOGETHER: °i. Ice/Heat °ii. Over the counter pain medication °iii. Prescription pain medication °b. Most patients will experience some swelling and bruising around the hernia(s) such as the bellybutton, groins, or old incisions.  Ice packs or heating pads (30-60 minutes up to 6 times a day) will help. Use ice for the first few days to help decrease swelling and bruising, then switch to heat to help relax tight/sore spots and speed recovery.  Some people prefer to use ice alone, heat alone, alternating between ice & heat.  Experiment to what works for you.  Swelling and bruising can take  several weeks to resolve.   °c. It is helpful to take an over-the-counter pain medication regularly for the first few weeks.  Choose one of the following that works best for you: °i. Naproxen (Aleve, etc)  Two 220mg tabs twice a day °ii. Ibuprofen (Advil, etc) Three 200mg tabs four times a day (every meal & bedtime) °iii. Acetaminophen (Tylenol, etc) 325-650mg four times a day (every meal & bedtime) °d. A  prescription for pain medication should be given to you upon discharge.  Take your pain medication as prescribed.  °i. If you are having problems/concerns with the prescription medicine (does not control pain, nausea, vomiting, rash, itching, etc), please call us [phone] to see if we need to switch you to a different pain medicine that will work better for you and/or control your side effect better. °ii. If you need a refill on your pain medication, please contact your pharmacy.  They will contact our office to request authorization. Prescriptions will not be filled after 5 pm or on week-ends. °4. Avoid getting constipated.  Between the surgery and the pain medications, it is common to experience some constipation.  Increasing fluid intake and taking a fiber supplement (such as Metamucil, Citrucel, FiberCon, MiraLax, etc) 1-2 times a day regularly will usually help prevent this problem from occurring.  A mild laxative (prune juice, Milk of Magnesia, MiraLax, etc) should be taken according to package directions if there are no bowel movements after 48 hours.   °5. Wash / shower every day.  You may shower over the dressings as they are waterproof.   °6. Remove   your waterproof bandages 5 days after surgery.  You may leave the incision open to air.  You may replace a dressing/Band-Aid to cover the incision for comfort if you wish.  Continue to shower over incision(s) after the dressing is off. ° ° ° °7. ACTIVITIES as tolerated:   °a. You may resume regular (light) daily activities beginning the next day--such  as daily self-care, walking, climbing stairs--gradually increasing activities as tolerated.  If you can walk 30 minutes without difficulty, it is safe to try more intense activity such as jogging, treadmill, bicycling, low-impact aerobics, swimming, etc. °b. Save the most intensive and strenuous activity for last such as sit-ups, heavy lifting, contact sports, etc  Refrain from any heavy lifting or straining until you are off narcotics for pain control.   °c. DO NOT PUSH THROUGH PAIN.  Let pain be your guide: If it hurts to do something, don't do it.  Pain is your body warning you to avoid that activity for another week until the pain goes down. °d. You may drive when you are no longer taking prescription pain medication, you can comfortably wear a seatbelt, and you can safely maneuver your car and apply brakes. °e. You may have sexual intercourse when it is comfortable.  °8. FOLLOW UP in our office °a. Please call [facility] at [phone] to set up an appointment to see your surgeon in the office for a follow-up appointment approximately 2-3 weeks after your surgery. °b. Make sure that you call for this appointment the day you arrive home to insure a convenient appointment time. °9.  IF YOU HAVE DISABILITY OR FAMILY LEAVE FORMS, BRING THEM TO THE OFFICE FOR PROCESSING.  DO NOT GIVE THEM TO YOUR DOCTOR. ° °WHEN TO CALL US [phone]: °1. Poor pain control °2. Reactions / problems with new medications (rash/itching, nausea, etc)  °3. Fever over 101.5 F (38.5 C) °4. Inability to urinate °5. Nausea and/or vomiting °6. Worsening swelling or bruising °7. Continued bleeding from incision. °8. Increased pain, redness, or drainage from the incision ° ° The clinic staff is available to answer your questions during regular business hours (8:30am-5pm).  Please don’t hesitate to call and ask to speak to one of our nurses for clinical concerns.  ° If you have a medical emergency, go to the nearest emergency room or call  911. ° A surgeon from [facility] is always on call at the hospitals in [place] ° °[facility] °[address], [address], [place], [place]  [address] ? ° [address] [address], [place], [place]   [address] °MAIN: [phone] ? TOLL FREE: [phone] ? FAX: [phone] °[email or web address] ° ° °

## 2016-12-14 NOTE — Interval H&P Note (Signed)
History and Physical Interval Note:  12/14/2016 11:39 AM  Cody Coleman  has presented today for surgery, with the diagnosis of PARASTOMAL RECURRENT HERNIA   The various methods of treatment have been discussed with the patient and family. After consideration of risks, benefits and other options for treatment, the patient has consented to  Procedure(s): LAPAROSCOPIC PARASTOMAL WALL HERNIA REPAIR WITH LYSIS OF ADHESIONS (N/A) as a surgical intervention .  The patient's history has been reviewed, patient examined, no change in status, stable for surgery.  I have reviewed the patient's chart and labs.  Questions were answered to the patient's satisfaction.     [name]

## 2016-12-14 NOTE — Progress Notes (Signed)
Assisted Dr. Rose with right, left, ultrasound guided, transabdominal plane block. Side rails up, monitors on throughout procedure. See vital signs in flow sheet. Tolerated Procedure well. 

## 2016-12-15 ENCOUNTER — Encounter (HOSPITAL_COMMUNITY): Payer: Self-pay | Admitting: Surgery

## 2016-12-15 DIAGNOSIS — Z85048 Personal history of other malignant neoplasm of rectum, rectosigmoid junction, and anus: Secondary | ICD-10-CM | POA: Diagnosis not present

## 2016-12-15 DIAGNOSIS — G479 Sleep disorder, unspecified: Secondary | ICD-10-CM | POA: Diagnosis present

## 2016-12-15 DIAGNOSIS — I2510 Atherosclerotic heart disease of native coronary artery without angina pectoris: Secondary | ICD-10-CM | POA: Diagnosis present

## 2016-12-15 DIAGNOSIS — I5022 Chronic systolic (congestive) heart failure: Secondary | ICD-10-CM | POA: Diagnosis present

## 2016-12-15 DIAGNOSIS — K5900 Constipation, unspecified: Secondary | ICD-10-CM | POA: Diagnosis present

## 2016-12-15 DIAGNOSIS — Z951 Presence of aortocoronary bypass graft: Secondary | ICD-10-CM | POA: Diagnosis not present

## 2016-12-15 DIAGNOSIS — K435 Parastomal hernia without obstruction or  gangrene: Secondary | ICD-10-CM | POA: Diagnosis present

## 2016-12-15 DIAGNOSIS — I4891 Unspecified atrial fibrillation: Secondary | ICD-10-CM | POA: Diagnosis present

## 2016-12-15 DIAGNOSIS — K430 Incisional hernia with obstruction, without gangrene: Secondary | ICD-10-CM | POA: Diagnosis present

## 2016-12-15 DIAGNOSIS — Z933 Colostomy status: Secondary | ICD-10-CM | POA: Diagnosis not present

## 2016-12-15 DIAGNOSIS — Z7901 Long term (current) use of anticoagulants: Secondary | ICD-10-CM

## 2016-12-15 DIAGNOSIS — Z955 Presence of coronary angioplasty implant and graft: Secondary | ICD-10-CM | POA: Diagnosis not present

## 2016-12-15 DIAGNOSIS — N400 Enlarged prostate without lower urinary tract symptoms: Secondary | ICD-10-CM | POA: Diagnosis present

## 2016-12-15 DIAGNOSIS — K660 Peritoneal adhesions (postprocedural) (postinfection): Secondary | ICD-10-CM | POA: Diagnosis present

## 2016-12-15 DIAGNOSIS — E7800 Pure hypercholesterolemia, unspecified: Secondary | ICD-10-CM | POA: Diagnosis present

## 2016-12-15 DIAGNOSIS — R001 Bradycardia, unspecified: Secondary | ICD-10-CM

## 2016-12-15 DIAGNOSIS — Z87891 Personal history of nicotine dependence: Secondary | ICD-10-CM | POA: Diagnosis not present

## 2016-12-15 DIAGNOSIS — I252 Old myocardial infarction: Secondary | ICD-10-CM | POA: Diagnosis not present

## 2016-12-15 HISTORY — DX: Unspecified atrial fibrillation: I48.91

## 2016-12-15 MED ORDER — RIVAROXABAN 20 MG PO TABS
20.0000 mg | ORAL_TABLET | Freq: Every day | ORAL | Status: DC
  Administered 2016-12-15: 20 mg via ORAL
  Filled 2016-12-15: qty 1

## 2016-12-15 MED ORDER — ENOXAPARIN SODIUM 40 MG/0.4ML ~~LOC~~ SOLN
40.0000 mg | Freq: Once | SUBCUTANEOUS | Status: AC
  Administered 2016-12-15: 40 mg via SUBCUTANEOUS

## 2016-12-15 MED ORDER — SODIUM CHLORIDE 0.9 % IV SOLN: 250.0000 mL | INTRAVENOUS | Status: DC | PRN

## 2016-12-15 MED ORDER — SODIUM CHLORIDE 0.9% FLUSH: 3.0000 mL | INTRAVENOUS | Status: DC | PRN

## 2016-12-15 MED ORDER — ALUM & MAG HYDROXIDE-SIMETH 200-200-20 MG/5ML PO SUSP: 30.0000 mL | Freq: Four times a day (QID) | ORAL | Status: DC | PRN

## 2016-12-15 MED ORDER — POLYETHYLENE GLYCOL 3350 17 G PO PACK
17.0000 g | PACK | Freq: Every day | ORAL | Status: DC
  Administered 2016-12-15: 17 g via ORAL
  Filled 2016-12-15: qty 1

## 2016-12-15 MED ORDER — SODIUM CHLORIDE 0.9% FLUSH
3.0000 mL | Freq: Two times a day (BID) | INTRAVENOUS | Status: DC
  Administered 2016-12-15: 3 mL via INTRAVENOUS

## 2016-12-15 NOTE — Addendum Note (Signed)
Addendum  created 12/15/16 4825 by Lollie Sails, CRNA   Charge Capture section accepted

## 2016-12-15 NOTE — Care Management Obs Status (Signed)
MEDICARE OBSERVATION STATUS NOTIFICATION   Patient Details  Name: SHAHMEER BUNN MRN: [id number] Date of Birth: 1945/12/03   Medicare Observation Status Notification Given:  Yes    Guadalupe Maple, RN 12/15/2016, 4:06 PM

## 2016-12-15 NOTE — Consult Note (Signed)
WOC ostomy follow up Stoma type/location: LLQ, end colostomy Stomal assessment/size: 1 1/2" round, slightly long, moist, with peristalsis Peristomal assessment: intact  Treatment options for stomal/peristomal skin: powder and skin prep Output: none Ostomy pouching: 2pc. 2 1/4" pouch placed today with wife performing the change Education provided:  Wife very capable of all ostomy care. Pouch changed to assess if current products pt has can be used once home.  Stoma continues to be of size that pt can use current supplies. Did supply with pouches without air filter and instructed to use for a few weeks to make sure pt is having flatulence. Wife verbalizes understanding of rationale and to notify MD office if no flatulence. Pt encouraged to learn self care as wife does entire care for ostomy since 2014. We will not follow, but will remain available to this patient, to nursing, and the medical and/or surgical teams.  Please re-consult if we need to assist further.     Fara Olden, RN-C, WTA-C, [place] [facility] Associate Ost[facility] Care Associate

## 2016-12-15 NOTE — Progress Notes (Signed)
[facility]  [address] [place], [place] [address] Phone: [phone]  FAX: [place] [phone] 1945/11/17  CARE TEAM:  PCP: Redmond School, MD  Outpatient Care Team: Patient Care Team: Redmond School, MD as PCP - General (Internal Medicine) Leighton Ruff, MD as Consulting Physician (General Surgery) Michael Boston, MD as Consulting Physician (General Surgery) Larey Dresser, MD as Consulting Physician (Cardiology) Elsie Lincoln, MD as Consulting Physician (Family Medicine) Rogene Houston, MD as Consulting Physician (Gastroenterology)  Inpatient Treatment Team: Treatment Team: Attending Provider: Michael Boston, MD; Registered Nurse: Rossie Muskrat, RN; Technician: Sueanne Margarita, NT   Problem List:   Principal Problem:   Recurrent Parastomal & Incisional hernias s/p lap repair w mesh 12/15/2016 Active Problems:   Rectal cancer s/p APR/colostomy 06/11/2012   Constipation, chronic   Recurrent incisional hernia with incarceration s/p lap repair w mesh 12/15/2016   Sleep difficulties   CAD (coronary artery disease)   BPH (benign prostatic hyperplasia)   Chronic systolic CHF (congestive heart failure)    Atrial fibrillation ([place])   Bradycardia - intermittent   Chronic anticoagulation   1 Day Post-Op  12/14/2016  POST-OPERATIVE DIAGNOSIS:    RECURRENT PARASTOMAL HERNIA  INCISIONAL HERNIAS (incarcerated, recurrent)  PROCEDURE:   REPAIR OF RECURRENT PERISTOMAL HERNIA WITH MESH LAPAROSCOPIC INCISIONAL HERNIA REPAIR WITH MESH LAPAROSCOPIC LYSIS OF ADHESIONS   SURGEON:  [name], MD    Assessment  Recovering  Plan:  -adv diet -stop IVFs w h/o CHF -coreg for CAD, Afib -restart Xerelto tonight if no bleeding -BP control -BPH - flomax -VTE prophylaxis- SCDs, etc -mobilize as tolerated to help recovery  30 minutes spent in review, evaluation, examination, counseling,  and coordination of care.  More than 50% of that time was spent in counseling.  [name], M.D., F.A.C.S. Gastrointestinal and Minimally Invasive Surgery [facility] [address] [place], [place] [address] [phone] Main / Paging   12/15/2016    Subjective: (Chief complaint)  Tol clears Pain controlled - walked in hallways already Mild bleeding at one port site last night - not overnight Wife at bedside  Objective:  Vital signs:  Vitals:   12/14/16 1938 12/14/16 2037 12/15/16 0223 12/15/16 0505  BP: 110/78 119/80 111/71 (!) 94/57  Pulse: 76 70 69 67  Resp: 16 18 18 18   Temp: 97.8 F (36.6 C) 97.6 F (36.4 C) 97.8 F (36.6 C) 97.8 F (36.6 C)  TempSrc: Axillary Axillary Oral Oral  SpO2: 98% 98% 94% 94%  Weight:      Height:           Intake/Output   Yesterday:  11/14 0701 - 11/15 0700 In: 2068.3 [P.O.:120; I.V.:1948.3] Out: 670 [Urine:650; Blood:20] This shift:  No intake/output data recorded.  Bowel function:  Flatus: YES  BM:  No  Drain: (No drain)   Physical Exam:  General: Pt awake/alert/oriented x4 in no acute distress Eyes: PERRL, normal EOM.  Sclera clear.  No icterus Neuro: CN II-XII intact w/o focal sensory/motor deficits. Lymph: No head/neck/groin lymphadenopathy Psych:  No delerium/psychosis/paranoia HENT: Normocephalic, Mucus membranes moist.  No thrush Neck: Supple, No tracheal deviation Chest: No chest wall pain w good excursion CV:  Pulses intact.  Regular rhythm MS: Normal AROM mjr joints.  No obvious deformity  Abdomen: Soft.  Nondistended.  Mildly tender at incisions only.  No evidence of peritonitis.  No incarcerated hernias.Colostomy w gas  in bag.  No leak  Ext:  No deformity.  No mjr edema.  No cyanosis Skin: No petechiae / purpura  Results:   Labs: No results found for this or any previous visit (from the past 48 hour(s)).  Imaging / Studies: No results  found.  Medications / Allergies: per chart  Antibiotics: Anti-infectives (From admission, onward)   Start     Dose/Rate Route Frequency Ordered Stop   12/14/16 1315  clindamycin (CLEOCIN) 900 mg, gentamicin (GARAMYCIN) 240 mg in sodium chloride 0.9 % 1,000 mL for intraperitoneal lavage  Status:  Discontinued      Intraperitoneal To Surgery 12/14/16 1300 12/14/16 1727   12/14/16 1045  clindamycin (CLEOCIN) IVPB 900 mg     900 mg 100 mL/hr over 30 Minutes Intravenous On call to O.R. 12/14/16 1039 12/14/16 1235   12/14/16 1045  gentamicin (GARAMYCIN) 350 mg in dextrose 5 % 100 mL IVPB     5 mg/kg  70.2 kg (Adjusted) 108.8 mL/hr over 60 Minutes Intravenous On call to O.R. 12/14/16 1039 12/14/16 1325        Note: Portions of this report may have been transcribed using voice recognition software. Every effort was made to ensure accuracy; however, inadvertent computerized transcription errors may be present.   Any transcriptional errors that result from this process are unintentional.     [name], M.D., F.A.C.S. Gastrointestinal and Minimally Invasive Surgery [facility] [address], [place], [place] [address] [phone] Main / Paging   12/15/2016

## 2016-12-16 MED ORDER — OXYCODONE HCL 5 MG PO TABS: 5.0000 mg | ORAL_TABLET | Freq: Four times a day (QID) | ORAL | 0 refills | Status: DC | PRN

## 2016-12-16 MED ORDER — POLYETHYLENE GLYCOL 3350 17 G PO PACK
17.0000 g | PACK | Freq: Two times a day (BID) | ORAL | Status: DC
  Administered 2016-12-16: 17 g via ORAL
  Filled 2016-12-16: qty 1

## 2016-12-16 NOTE — Progress Notes (Signed)
Discharge instructions reviewed with patient and wife. All questions answered. Patient wheeled down to vehicle with belongings by nurse tech.

## 2016-12-16 NOTE — Discharge Summary (Signed)
Physician Discharge Summary  Patient ID: Cody Coleman MRN: [id number] DOB/AGE: 1945/10/24  71 y.o.  Admit date: 12/14/2016 Discharge date: 12/16/2016   Patient Care Team: Redmond School, MD as PCP - General (Internal Medicine) Leighton Ruff, MD as Consulting Physician (General Surgery) Michael Boston, MD as Consulting Physician (General Surgery) Larey Dresser, MD as Consulting Physician (Cardiology) Elsie Lincoln, MD as Consulting Physician (Family Medicine) Rogene Houston, MD as Consulting Physician (Gastroenterology)  Discharge Diagnoses:  Principal Problem:   Recurrent Parastomal & Incisional hernias s/p lap repair w mesh 12/15/2016 Active Problems:   Rectal cancer s/p APR/colostomy 06/11/2012   Constipation, chronic   Parastomal hernia without obstruction or gangrene   Recurrent incisional hernia with incarceration s/p lap repair w mesh 12/15/2016   Sleep difficulties   CAD (coronary artery disease)   BPH (benign prostatic hyperplasia)   Chronic systolic CHF (congestive heart failure)    Atrial fibrillation (HCC)   Bradycardia - intermittent   Chronic anticoagulation   2 Days Post-Op  12/14/2016  POST-OPERATIVE DIAGNOSIS:   PARASTOMAL RECURRENT HERNIA   SURGERY:  12/14/2016  Procedure(s): PREPARE OF RECURRENT PERISTOMAL HERNIA WITH MESH WITH LAPAROSCOPIC LYSIS OF ADHESIONS, AND INCISIONAL HERNIA REPAIR WITH MESH  SURGEON:    Surgeon(s): Michael Boston, MD  Consults: None  Hospital Course:   The patient underwent the surgery above.  Postoperatively, the patient gradually mobilized and advanced to a solid diet.  Pain and other symptoms were treated aggressively.    By the time of discharge, the patient was walking well the hallways, eating food, having flatus.  Pain was well-controlled on an oral medications.  Based on meeting discharge criteria and continuing to recover, I felt it was safe for the patient to be discharged from the hospital to  further recover with close followup. Postoperative recommendations were discussed in detail.  They are written as well.  I updated the patient's status to the patient and spouse.  Recommendations were made.  Questions were answered.  They expressed understanding & appreciation.   Discharged Condition: good  Disposition:  Follow-up Information    Michael Boston, MD. Schedule an appointment as soon as possible for a visit in 3 weeks.   Specialty:  General Surgery Why:  To follow up after your operation, To follow up after your hospital stay Contact information: [place] [address] [phone]           01-Home or Self Care    Allergies as of 12/16/2016   No Active Allergies     Medication List    TAKE these medications   ALPRAZolam 1 MG tablet Commonly known as:  XANAX Take 1 mg by mouth 4 (four) times daily as needed for anxiety or sleep.   atorvastatin 40 MG tablet Commonly known as:  LIPITOR TAKE 1 TABLET BY MOUTH DAILY   carvedilol 3.125 MG tablet Commonly known as:  COREG Take 1 tablet (3.125 mg total) by mouth 2 (two) times daily.   lisinopril 2.5 MG tablet Commonly known as:  PRINIVIL,ZESTRIL Take 1 tablet (2.5 mg total) by mouth at bedtime.   oxyCODONE 5 MG immediate release tablet Commonly known as:  Oxy IR/ROXICODONE Take 1-2 tablets (5-10 mg total) every 6 (six) hours as needed by mouth for moderate pain.   tamsulosin 0.4 MG Caps capsule Commonly known as:  FLOMAX Take 0.4 mg by mouth daily after breakfast.   XARELTO 20 MG Tabs tablet Generic drug:  rivaroxaban Take 1 tablet (20  mg total) by mouth daily with supper.       Significant Diagnostic Studies:  No results found for this or any previous visit (from the past 72 hour(s)).  No results found.  Discharge Exam: Blood pressure 101/62, pulse 64, temperature 98.7 F (37.1 C), temperature source Oral, resp. rate 18, height 5' 6.93" (1.7 m), weight 76.3 kg (168  lb 3.4 oz), SpO2 97 %.  General: Pt awake/alert/oriented x4 in no acute distress Eyes: PERRL, normal EOM.  Sclera clear.  No icterus Neuro: CN II-XII intact w/o focal sensory/motor deficits. Lymph: No head/neck/groin lymphadenopathy Psych:  No delerium/psychosis/paranoia HENT: Normocephalic, Mucus membranes moist.  No thrush Neck: Supple, No tracheal deviation Chest: No chest wall pain w good excursion CV:  Pulses intact.  Regular rhythm MS: Normal AROM mjr joints.  No obvious deformity Abdomen: Soft.  Nondistended.  Mildly tender at incisions only.  Colostomy pink with gas & stool.  No evidence of peritonitis.  No incarcerated hernias. Ext:  SCDs BLE.  No mjr edema.  No cyanosis Skin: No petechiae / purpura  Past Medical History:  Diagnosis Date  . Atrial fibrillation ([place]) 12/15/2016  . BPH (benign prostatic hyperplasia)   . CAD (coronary artery disease)   . Cancer (HCC)    RECTAL CANCER--SOME RECTAL BLEEDING  . Former tobacco use   . High cholesterol   . MI (myocardial infarction) ([place])    x 2  AT AGE 13 AND AT AGE 6  DR. [facility] IS PT'S MEDICAL DOCTOR  . Pain    RIGHT KNEE PAIN AND OCCAS OTHER JOINT PAINS  . Sleep difficulties    NEVER SLEEPS WELL - WAKES UP AFTER SEVERAL HOURS AND NOT ABLE TO GO BACK TO SLEEP--DID SLEEP STUDY AND TOLD HE DID NOT HAVE SLEEP APNEA  . Urgency-frequency syndrome    FLOMAX HAS HELPED    Past Surgical History:  Procedure Laterality Date  . CARDIOVERSION N/A 10/21/2015   Procedure: CARDIOVERSION;  Surgeon: Larey Dresser, MD;  Location: [facility];  Service: Cardiovascular;  Laterality: N/A;  . COLONOSCOPY N/A 04/20/2012   Procedure: COLONOSCOPY;  Surgeon: Rogene Houston, MD;  [facility];  Service: Endoscopy;  Laterality: N/A;  225  . COLONOSCOPY N/A 06/14/2013   Procedure: COLONOSCOPY;  Surgeon: Rogene Houston, MD;  [facility];  Service: Endoscopy;  Laterality: N/A;  1245  . COLOSTOMY REVISION N/A 01/17/2013    Procedure: COLOSTOMY REVISION;  Surgeon: Leighton Ruff, MD;  Location: [facility];  Service: General;  Laterality: N/A;  . CORONARY ANGIOPLASTY WITH STENT PLACEMENT     before bypass  . CORONARY ARTERY BYPASS GRAFT     2005  . EUS N/A 05/03/2012   Procedure: LOWER ENDOSCOPIC ULTRASOUND (EUS);  Surgeon: Milus Banister, MD;  Location: [facility];  Service: Endoscopy;  Laterality: N/A;  . INSERTION OF MESH N/A 10/25/2013   Procedure: INSERTION OF STRATTICE MESH;  Surgeon: Leighton Ruff, MD;  Location: [facility];  Service: General;  Laterality: N/A;  . LAPAROSCOPIC ASSISTED ABDOMINAL PERINEAL RESECTION N/A 06/11/2012   Procedure: LAPAROSCOPIC ASSISTED ABDOMINAL PERINEAL RESECTION;  Surgeon: Leighton Ruff, MD;  Location: [facility];  Service: General;  Laterality: N/A;  . LAPAROSCOPIC PARASTOMAL HERNIA N/A 10/25/2013   Procedure: ROBOTIC PARASTOMAL HERNIA REPAIR WITH STRATTICE MESH;  Surgeon: Leighton Ruff, MD;  [facility];  Service: General;  Laterality: N/A;  . PARASTOMAL HERNIA REPAIR N/A 12/14/2016   Procedure: PREPARE OF RECURRENT PERISTOMAL HERNIA WITH MESH WITH LAPAROSCOPIC LYSIS  OF ADHESIONS, AND INCISIONAL HERNIA REPAIR WITH MESH;  Surgeon: Michael Boston, MD;  Location: [facility];  Service: General;  Laterality: N/A;  . TONSILLECTOMY      Social History   Socioeconomic History  . Marital status: Married    Spouse name: Not on file  . Number of children: Not on file  . Years of education: Not on file  . Highest education level: Not on file  Social Needs  . Financial resource strain: Not on file  . Food insecurity - worry: Not on file  . Food insecurity - inability: Not on file  . Transportation needs - medical: Not on file  . Transportation needs - non-medical: Not on file  Occupational History  . Not on file  Tobacco Use  . Smoking status: Former Smoker    Packs/day: 1.00    Years: 30.00    Pack years: 30.00    Types: Cigarettes    Last attempt to quit: 01/31/2002    Years since  quitting: 14.8  . Smokeless tobacco: Never Used  Substance and Sexual Activity  . Alcohol use: Yes    Comment: rarely  . Drug use: No  . Sexual activity: No  Other Topics Concern  . Not on file  Social History Narrative   Lives with wife.  Drives, no assist devices.    Family History  Problem Relation Age of Onset  . Congestive Heart Failure Mother   . Stroke Father   . Coronary artery disease Father   . Coronary artery disease Brother   . Colon cancer Neg Hx     Current Facility-Administered Medications  Medication Dose Route Frequency Provider Last Rate Last Dose  . 0.9 %  sodium chloride infusion  250 mL Intravenous PRN Michael Boston, MD      . acetaminophen (TYLENOL) tablet 1,000 mg  1,000 mg Oral Tor Netters, MD   1,000 mg at 12/16/16 7564  . ALPRAZolam Duanne Moron) tablet 1 mg  1 mg Oral QID PRN Michael Boston, MD      . alum & mag hydroxide-simeth (MAALOX/MYLANTA) 200-200-20 MG/5ML suspension 30 mL  30 mL Oral Q6H PRN Michael Boston, MD      . atorvastatin (LIPITOR) tablet 40 mg  40 mg Oral Daily Michael Boston, MD   40 mg at 12/15/16 1041  . bisacodyl (DULCOLAX) suppository 10 mg  10 mg Rectal Daily PRN Michael Boston, MD      . carvedilol (COREG) tablet 3.125 mg  3.125 mg Oral BID Michael Boston, MD   3.125 mg at 12/15/16 2107  . diphenhydrAMINE (BENADRYL) 12.5 MG/5ML elixir 12.5 mg  12.5 mg Oral Q6H PRN Michael Boston, MD       Or  . diphenhydrAMINE (BENADRYL) injection 12.5 mg  12.5 mg Intravenous Q6H PRN Michael Boston, MD      . gabapentin (NEURONTIN) capsule 300 mg  300 mg Oral Ardeen Fillers, MD   300 mg at 12/15/16 2059  . guaiFENesin-dextromethorphan (ROBITUSSIN DM) 100-10 MG/5ML syrup 10 mL  10 mL Oral Q4H PRN Michael Boston, MD      . hydrALAZINE (APRESOLINE) injection 5-10 mg  5-10 mg Intravenous Q4H PRN Michael Boston, MD      . hydrocortisone (ANUSOL-HC) 2.5 % rectal cream 1 application  1 application Topical QID PRN Michael Boston, MD      . hydrocortisone cream  1 % 1 application  1 application Topical TID PRN Michael Boston, MD      . HYDROmorphone (DILAUDID) injection  0.5-2 mg  0.5-2 mg Intravenous Q2H PRN Michael Boston, MD   0.5 mg at 12/14/16 1757  . lactated ringers bolus 1,000 mL  1,000 mL Intravenous Q8H PRN Michael Boston, MD      . lactated ringers infusion 1,000 mL  1,000 mL Intravenous Q8H PRN Lejend Dalby, Remo Lipps, MD      . lip balm (CARMEX) ointment 1 application  1 application Topical BID Michael Boston, MD   1 application at 09/40/76 2211  . lisinopril (PRINIVIL,ZESTRIL) tablet 2.5 mg  2.5 mg Oral Ardeen Fillers, MD   2.5 mg at 12/15/16 2109  . magic mouthwash  15 mL Oral QID PRN Michael Boston, MD      . menthol-cetylpyridinium (CEPACOL) lozenge 3 mg  1 lozenge Oral PRN Michael Boston, MD      . methocarbamol (ROBAXIN) tablet 500 mg  500 mg Oral Q6H PRN Michael Boston, MD   500 mg at 12/15/16 0054  . metoCLOPramide (REGLAN) injection 5-10 mg  5-10 mg Intravenous Q6H PRN Michael Boston, MD      . ondansetron (ZOFRAN-ODT) disintegrating tablet 4 mg  4 mg Oral Q6H PRN Michael Boston, MD       Or  . ondansetron [facility]) injection 4 mg  4 mg Intravenous Q6H PRN Michael Boston, MD      . oxyCODONE (Oxy IR/ROXICODONE) immediate release tablet 5-10 mg  5-10 mg Oral Q4H PRN Michael Boston, MD   5 mg at 12/15/16 2100  . phenol (CHLORASEPTIC) mouth spray 1-2 spray  1-2 spray Mouth/Throat PRN Michael Boston, MD      . polyethylene glycol (MIRALAX / GLYCOLAX) packet 17 g  17 g Oral Daily PRN Michael Boston, MD   17 g at 12/14/16 2057  . polyethylene glycol (MIRALAX / GLYCOLAX) packet 17 g  17 g Oral Daily Michael Boston, MD   17 g at 12/15/16 1050  . rivaroxaban (XARELTO) tablet 20 mg  20 mg Oral Q supper Michael Boston, MD   20 mg at 12/15/16 1741  . simethicone (MYLICON) chewable tablet 40 mg  40 mg Oral Q6H PRN Michael Boston, MD      . sodium chloride flush (NS) 0.9 % injection 3 mL  3 mL Intravenous Gorden Harms, MD   3 mL at 12/15/16 1000  . sodium chloride  flush (NS) 0.9 % injection 3 mL  3 mL Intravenous PRN Michael Boston, MD      . tamsulosin (FLOMAX) capsule 0.4 mg  0.4 mg Oral QPC breakfast Michael Boston, MD   0.4 mg at 12/15/16 8088     No Active Allergies  Signed: Gavin Pound, M.D., F.A.C.S. Gastrointestinal and Minimally Invasive Surgery [facility] [address] [place], [place] [address] [phone] Main / Paging   12/16/2016, 7:46 AM

## 2017-01-03 DIAGNOSIS — Z85048 Personal history of other malignant neoplasm of rectum, rectosigmoid junction, and anus: Secondary | ICD-10-CM | POA: Diagnosis not present

## 2017-01-17 DIAGNOSIS — H5211 Myopia, right eye: Secondary | ICD-10-CM | POA: Diagnosis not present

## 2017-01-17 DIAGNOSIS — H5202 Hypermetropia, left eye: Secondary | ICD-10-CM | POA: Diagnosis not present

## 2017-01-17 DIAGNOSIS — H25813 Combined forms of age-related cataract, bilateral: Secondary | ICD-10-CM | POA: Diagnosis not present

## 2017-01-17 DIAGNOSIS — H52222 Regular astigmatism, left eye: Secondary | ICD-10-CM | POA: Diagnosis not present

## 2017-01-27 DIAGNOSIS — Z85048 Personal history of other malignant neoplasm of rectum, rectosigmoid junction, and anus: Secondary | ICD-10-CM | POA: Diagnosis not present

## 2017-02-27 ENCOUNTER — Other Ambulatory Visit (HOSPITAL_COMMUNITY): Payer: Self-pay | Admitting: Cardiology

## 2017-03-06 DIAGNOSIS — H2512 Age-related nuclear cataract, left eye: Secondary | ICD-10-CM | POA: Diagnosis not present

## 2017-03-06 DIAGNOSIS — H2513 Age-related nuclear cataract, bilateral: Secondary | ICD-10-CM | POA: Diagnosis not present

## 2017-03-29 ENCOUNTER — Other Ambulatory Visit: Payer: Self-pay | Admitting: Cardiology

## 2017-03-29 DIAGNOSIS — I25810 Atherosclerosis of coronary artery bypass graft(s) without angina pectoris: Secondary | ICD-10-CM

## 2017-04-18 DIAGNOSIS — H2511 Age-related nuclear cataract, right eye: Secondary | ICD-10-CM | POA: Diagnosis not present

## 2017-04-18 DIAGNOSIS — H2512 Age-related nuclear cataract, left eye: Secondary | ICD-10-CM | POA: Diagnosis not present

## 2017-04-18 DIAGNOSIS — H25811 Combined forms of age-related cataract, right eye: Secondary | ICD-10-CM | POA: Diagnosis not present

## 2017-04-25 DIAGNOSIS — H2512 Age-related nuclear cataract, left eye: Secondary | ICD-10-CM | POA: Diagnosis not present

## 2017-04-25 DIAGNOSIS — H2511 Age-related nuclear cataract, right eye: Secondary | ICD-10-CM | POA: Diagnosis not present

## 2017-04-25 DIAGNOSIS — H25812 Combined forms of age-related cataract, left eye: Secondary | ICD-10-CM | POA: Diagnosis not present

## 2017-05-08 DIAGNOSIS — H16222 Keratoconjunctivitis sicca, not specified as Sjogren's, left eye: Secondary | ICD-10-CM | POA: Diagnosis not present

## 2017-05-08 DIAGNOSIS — H16221 Keratoconjunctivitis sicca, not specified as Sjogren's, right eye: Secondary | ICD-10-CM | POA: Diagnosis not present

## 2017-05-08 DIAGNOSIS — H16223 Keratoconjunctivitis sicca, not specified as Sjogren's, bilateral: Secondary | ICD-10-CM | POA: Diagnosis not present

## 2017-06-29 ENCOUNTER — Other Ambulatory Visit: Payer: Self-pay | Admitting: General Surgery

## 2017-06-29 DIAGNOSIS — Z85048 Personal history of other malignant neoplasm of rectum, rectosigmoid junction, and anus: Secondary | ICD-10-CM

## 2017-07-07 DIAGNOSIS — Z85048 Personal history of other malignant neoplasm of rectum, rectosigmoid junction, and anus: Secondary | ICD-10-CM | POA: Diagnosis not present

## 2017-07-10 ENCOUNTER — Ambulatory Visit
Admission: RE | Admit: 2017-07-10 | Discharge: 2017-07-10 | Disposition: A | Discharge status: Home / Self Care | Payer: Medicare Other | Source: Ambulatory Visit | Attending: General Surgery | Admitting: General Surgery

## 2017-07-10 DIAGNOSIS — Z85048 Personal history of other malignant neoplasm of rectum, rectosigmoid junction, and anus: Secondary | ICD-10-CM

## 2017-07-10 DIAGNOSIS — C189 Malignant neoplasm of colon, unspecified: Secondary | ICD-10-CM | POA: Diagnosis not present

## 2017-07-10 MED ORDER — IOPAMIDOL (ISOVUE-300) INJECTION 61%
100.0000 mL | Freq: Once | INTRAVENOUS | Status: AC | PRN
  Administered 2017-07-10: 100 mL via INTRAVENOUS

## 2017-07-11 DIAGNOSIS — Z85048 Personal history of other malignant neoplasm of rectum, rectosigmoid junction, and anus: Secondary | ICD-10-CM | POA: Diagnosis not present

## 2017-07-12 ENCOUNTER — Telehealth (HOSPITAL_COMMUNITY): Payer: Self-pay | Admitting: *Deleted

## 2017-07-12 NOTE — Telephone Encounter (Signed)
Received fax from [facility], they state we have pt on Xarelto and Dr Johney Maine just ordered him Naproxen and there is a possible drug interaction with the 2 so they would like [place] to advise them what to do.  Per Bonnita Nasuti, Pharm D ok to use them is Naproxen is only used temporarily for short term issues, pt should not be on the 2 together long-term.  Info sent to pharmacy

## 2017-07-26 DIAGNOSIS — H26492 Other secondary cataract, left eye: Secondary | ICD-10-CM | POA: Diagnosis not present

## 2017-07-26 DIAGNOSIS — H02831 Dermatochalasis of right upper eyelid: Secondary | ICD-10-CM | POA: Diagnosis not present

## 2017-07-26 DIAGNOSIS — H02834 Dermatochalasis of left upper eyelid: Secondary | ICD-10-CM | POA: Diagnosis not present

## 2017-07-26 DIAGNOSIS — Z961 Presence of intraocular lens: Secondary | ICD-10-CM | POA: Diagnosis not present

## 2017-08-08 ENCOUNTER — Telehealth (HOSPITAL_COMMUNITY): Payer: Self-pay | Admitting: *Deleted

## 2017-08-08 NOTE — Telephone Encounter (Signed)
Pt aware and agreeable.  

## 2017-08-08 NOTE — Telephone Encounter (Signed)
Hold Xarelto for 3 days then restart.

## 2017-08-08 NOTE — Telephone Encounter (Signed)
Pt called to report he's having nose bleeds daily for the past week accompanied by headaches. He says the nose bleed stops after about 69min after packing with gauze. Pt is concerned because he is on Xarelto. Pt stated he has not done anything different nose bleeds will start at rest or if he simply walks around his house.   Message routed to [place] for advice.

## 2017-08-14 ENCOUNTER — Telehealth (HOSPITAL_COMMUNITY): Payer: Self-pay

## 2017-08-14 DIAGNOSIS — Z961 Presence of intraocular lens: Secondary | ICD-10-CM | POA: Diagnosis not present

## 2017-08-14 DIAGNOSIS — H26491 Other secondary cataract, right eye: Secondary | ICD-10-CM | POA: Diagnosis not present

## 2017-08-14 NOTE — Telephone Encounter (Signed)
Per Dr. Aundra Dubin, pt should have a CBC and need to be worked in to see the [facility] clinic this week. Hold Xarelto until nose stops bleeding. Make sure pt is no longer taking naproxen. If ongoing bleeds, pt needs ENT appt. Left VM for pt to call office.

## 2017-08-14 NOTE — Telephone Encounter (Signed)
Pt is still having nose bleeds after holding Xarelto. Pt reports having bad nose bleeds on 07/13 and 07/14. Pt states that he wants to know if you would like him to have labs or any other recommendations. Please advise.

## 2017-08-28 ENCOUNTER — Other Ambulatory Visit: Payer: Self-pay | Admitting: Cardiology

## 2017-08-28 DIAGNOSIS — I25810 Atherosclerosis of coronary artery bypass graft(s) without angina pectoris: Secondary | ICD-10-CM

## 2017-09-04 DIAGNOSIS — H5202 Hypermetropia, left eye: Secondary | ICD-10-CM | POA: Diagnosis not present

## 2017-09-04 DIAGNOSIS — H52222 Regular astigmatism, left eye: Secondary | ICD-10-CM | POA: Diagnosis not present

## 2017-09-04 DIAGNOSIS — H5211 Myopia, right eye: Secondary | ICD-10-CM | POA: Diagnosis not present

## 2017-10-04 ENCOUNTER — Other Ambulatory Visit: Payer: Self-pay | Admitting: Cardiology

## 2017-10-04 DIAGNOSIS — Z85048 Personal history of other malignant neoplasm of rectum, rectosigmoid junction, and anus: Secondary | ICD-10-CM | POA: Diagnosis not present

## 2017-10-11 ENCOUNTER — Telehealth (HOSPITAL_COMMUNITY): Payer: Self-pay | Admitting: Cardiology

## 2017-10-11 DIAGNOSIS — R040 Epistaxis: Secondary | ICD-10-CM

## 2017-10-11 NOTE — Telephone Encounter (Signed)
Patient reports he was told to hold a medication in the past during a nose bleed. Advised this medication was xarelto. Ok to hold for 2 days, then restart. If nosebleeds returns will need a referral to ENT per Dr.McLean  Advised also that Dr Aundra Dubin wanted an OV and cbc, requested to only see Dr [facility]- appt 10/31 @ 1140, labs 9/12

## 2017-10-12 ENCOUNTER — Ambulatory Visit (HOSPITAL_COMMUNITY)
Admission: RE | Admit: 2017-10-12 | Discharge: 2017-10-12 | Disposition: A | Discharge status: Home / Self Care | Payer: Medicare Other | Source: Ambulatory Visit | Attending: Cardiology | Admitting: Cardiology

## 2017-10-12 DIAGNOSIS — R040 Epistaxis: Secondary | ICD-10-CM | POA: Diagnosis not present

## 2017-10-12 LAB — CBC
HCT: 37.4 % — ABNORMAL LOW (ref 39.0–52.0)
HEMOGLOBIN: 12.1 g/dL — AB (ref 13.0–17.0)
MCH: 29 pg (ref 26.0–34.0)
MCHC: 32.4 g/dL (ref 30.0–36.0)
MCV: 89.7 fL (ref 78.0–100.0)
Platelets: 151 10*3/uL (ref 150–400)
RBC: 4.17 MIL/uL — AB (ref 4.22–5.81)
RDW: 13.4 % (ref 11.5–15.5)
WBC: 10.5 10*3/uL (ref 4.0–10.5)

## 2017-10-20 DIAGNOSIS — Z23 Encounter for immunization: Secondary | ICD-10-CM | POA: Diagnosis not present

## 2017-11-10 ENCOUNTER — Other Ambulatory Visit (HOSPITAL_COMMUNITY): Payer: Self-pay | Admitting: Internal Medicine

## 2017-11-10 ENCOUNTER — Other Ambulatory Visit: Payer: Self-pay | Admitting: Cardiology

## 2017-11-10 DIAGNOSIS — I25810 Atherosclerosis of coronary artery bypass graft(s) without angina pectoris: Secondary | ICD-10-CM

## 2017-11-15 ENCOUNTER — Encounter: Payer: Self-pay | Admitting: Cardiology

## 2017-11-15 DIAGNOSIS — D649 Anemia, unspecified: Secondary | ICD-10-CM | POA: Diagnosis not present

## 2017-11-15 DIAGNOSIS — E663 Overweight: Secondary | ICD-10-CM | POA: Diagnosis not present

## 2017-11-15 DIAGNOSIS — Z1389 Encounter for screening for other disorder: Secondary | ICD-10-CM | POA: Diagnosis not present

## 2017-11-15 DIAGNOSIS — Z125 Encounter for screening for malignant neoplasm of prostate: Secondary | ICD-10-CM | POA: Diagnosis not present

## 2017-11-15 DIAGNOSIS — Z0001 Encounter for general adult medical examination with abnormal findings: Secondary | ICD-10-CM | POA: Diagnosis not present

## 2017-11-15 DIAGNOSIS — I5022 Chronic systolic (congestive) heart failure: Secondary | ICD-10-CM | POA: Diagnosis not present

## 2017-11-15 DIAGNOSIS — Z6826 Body mass index (BMI) 26.0-26.9, adult: Secondary | ICD-10-CM | POA: Diagnosis not present

## 2017-11-15 DIAGNOSIS — D696 Thrombocytopenia, unspecified: Secondary | ICD-10-CM | POA: Diagnosis not present

## 2017-11-15 DIAGNOSIS — R946 Abnormal results of thyroid function studies: Secondary | ICD-10-CM | POA: Diagnosis not present

## 2017-11-30 ENCOUNTER — Encounter (HOSPITAL_COMMUNITY): Payer: Medicare Other | Admitting: Cardiology

## 2017-12-04 DIAGNOSIS — J329 Chronic sinusitis, unspecified: Secondary | ICD-10-CM | POA: Diagnosis not present

## 2017-12-04 DIAGNOSIS — I5022 Chronic systolic (congestive) heart failure: Secondary | ICD-10-CM | POA: Diagnosis not present

## 2017-12-04 DIAGNOSIS — E663 Overweight: Secondary | ICD-10-CM | POA: Diagnosis not present

## 2017-12-04 DIAGNOSIS — Z6826 Body mass index (BMI) 26.0-26.9, adult: Secondary | ICD-10-CM | POA: Diagnosis not present

## 2017-12-08 ENCOUNTER — Encounter (HOSPITAL_COMMUNITY): Payer: Self-pay | Admitting: Cardiology

## 2017-12-08 ENCOUNTER — Ambulatory Visit (HOSPITAL_COMMUNITY)
Admission: RE | Admit: 2017-12-08 | Discharge: 2017-12-08 | Disposition: A | Discharge status: Home / Self Care | Payer: Medicare Other | Source: Ambulatory Visit | Attending: Cardiology | Admitting: Cardiology

## 2017-12-08 VITALS — BP 128/72 | HR 78 | Wt 166.2 lb

## 2017-12-08 DIAGNOSIS — Z87891 Personal history of nicotine dependence: Secondary | ICD-10-CM | POA: Diagnosis not present

## 2017-12-08 DIAGNOSIS — I252 Old myocardial infarction: Secondary | ICD-10-CM | POA: Diagnosis not present

## 2017-12-08 DIAGNOSIS — I482 Chronic atrial fibrillation, unspecified: Secondary | ICD-10-CM | POA: Diagnosis not present

## 2017-12-08 DIAGNOSIS — I2510 Atherosclerotic heart disease of native coronary artery without angina pectoris: Secondary | ICD-10-CM | POA: Insufficient documentation

## 2017-12-08 DIAGNOSIS — Z85048 Personal history of other malignant neoplasm of rectum, rectosigmoid junction, and anus: Secondary | ICD-10-CM | POA: Insufficient documentation

## 2017-12-08 DIAGNOSIS — I25810 Atherosclerosis of coronary artery bypass graft(s) without angina pectoris: Secondary | ICD-10-CM | POA: Diagnosis not present

## 2017-12-08 DIAGNOSIS — E785 Hyperlipidemia, unspecified: Secondary | ICD-10-CM | POA: Insufficient documentation

## 2017-12-08 DIAGNOSIS — Z8249 Family history of ischemic heart disease and other diseases of the circulatory system: Secondary | ICD-10-CM | POA: Insufficient documentation

## 2017-12-08 DIAGNOSIS — Z951 Presence of aortocoronary bypass graft: Secondary | ICD-10-CM | POA: Insufficient documentation

## 2017-12-08 DIAGNOSIS — Z933 Colostomy status: Secondary | ICD-10-CM | POA: Insufficient documentation

## 2017-12-08 DIAGNOSIS — Z79899 Other long term (current) drug therapy: Secondary | ICD-10-CM | POA: Diagnosis not present

## 2017-12-08 DIAGNOSIS — R001 Bradycardia, unspecified: Secondary | ICD-10-CM | POA: Insufficient documentation

## 2017-12-08 DIAGNOSIS — Z7901 Long term (current) use of anticoagulants: Secondary | ICD-10-CM | POA: Insufficient documentation

## 2017-12-08 DIAGNOSIS — I5022 Chronic systolic (congestive) heart failure: Secondary | ICD-10-CM | POA: Insufficient documentation

## 2017-12-08 DIAGNOSIS — I255 Ischemic cardiomyopathy: Secondary | ICD-10-CM | POA: Diagnosis not present

## 2017-12-08 NOTE — Patient Instructions (Signed)
  Your physician has requested that you have an echocardiogram. Echocardiography is a painless test that uses sound waves to create images of your heart. It provides your doctor with information about the size and shape of your heart and how well your heart's chambers and valves are working. This procedure takes approximately one hour. There are no restrictions for this procedure.  Your physician recommends that you schedule a follow-up appointment in: 6 months. Call in March to arrange your 6 months follow up with Dr. Aundra Dubin.

## 2017-12-10 NOTE — Progress Notes (Signed)
PCP: Dr. Gerarda Fraction Cardiology: Dr. Aundra Dubin  72 y.o. with history of CAD s/p CABG and rectal cancer s/p abdominal perineal resection in 5/14 presents for cardiology followup.  He had CABG x 6 in 2005.  Stress tests later in 2005 and in 2008 were normal.  As above, he had APR in 5/14.  He had a complicated colostomy revision in 12/14.  After this operation, he developed abdominal wall cellulitis and septic shock, also in 12/14.  He was positive for influenza also.  His colostomy will be permanent. He had moved to [facility] but has returned to live in [place].   At a prior appointment, he was noted to be in atrial fibrillation with rate control.  He did not realize that he was in atrial fibrillation.  Holter monitor showed persistent AF.  He looked volume overloaded on exam and Lasix was started.  Echo in 8/17 showed EF 45-50%, diffuse hypokinesis.  He underwent DCCV 9/17 but did not hold NSR.  Lexiscan Cardiolite in 10/17 showed EF 40%, lateral fixed defect, no ischemia.  Echo in 8/18 showed EF 40% with wall motion abnormalities.    He returns for followup of CAD and ischemic cardiomyopathy.  He has been doing well, takes a brisk walk for 3.5 miles daily up and down hills.  No exertional dyspnea or chest pain.  No BRBPR/melena, continues Xarelto.  No palpitations or lightheadedness.  He checks BP daily, still gets low at times, have cut back lisinopril in the past.  No lightheadedness.  His weight is down 12 lbs.   ECG (personally reviewed): Atrial fibrillation 63 bpm, nonspecific T wave flattening  Labs (3/14): LDL 83, HDL 64, TGs 203 Labs (4/14): K 3.4, creatinine 0.77 Labs (6/14): LDL 63, HDL 41 Labs (5/15): creatinine 0.78 Labs (8/17): LDL 63, HDL 52 Labs (9/17): TSH normal, K 4, creatinine 0.87 Labs (10/17): K 4.2, creatinine 0.77, BNP 108.5 Labs (12/17): HCT 41.6, LDL 68, HDL 50 Labs (6/18): K 3.9, creatinine 1.06 Labs (9/19): hgb 12.1  PMH: 1. Rectal cancer: Abdominal perineal resection in  5/14, has permanent colostomy.  2. CAD: CABG in 2005 with LIMA-LAD, SVG-D, seq SVG-OM1/dLCx, seq SVG-PDA/PLV.  Stress myoviews in 11/05 and in 2008 were normal. 3. Hyperlipidemia 4. Atrial fibrillation: First noted 8/17.  Now chronic.  - DCCV 9/17: Converted to NSR but went back into atrial fibrillation before leaving hospital.  5. Chronic systolic CHF: Ischemic cardiomyopathy.  - Echo (8/17) with EF 45-50%, mild LVH, mildly dilated LV.   - Lexiscan Cardiolite (10/17): EF 40%, no ischemia, lateral fixed defect.  - Echo (8/18): EF 40%, anterolateral/inferolateral HK, basal to mid inferior AK, normal RV size with mildly decreased systolic function.  6. Bradycardia  SH: Lives in 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    FH: CAD (brother), CVA (father), CHF (mother).   ROS: All systems were reviewed and negative except as per HPI.   Current Outpatient Medications  Medication Sig Dispense Refill  . ALPRAZolam (XANAX) 1 MG tablet Take 1 mg by mouth 4 (four) times daily as needed for anxiety or sleep.     Marland Kitchen atorvastatin (LIPITOR) 40 MG tablet TAKE 1 TABLET BY MOUTH DAILY 30 tablet 4  . carvedilol (COREG) 3.125 MG tablet TAKE ONE TABLET BY MOUTH TWICE DAILY 60 tablet 1  . ferrous sulfate 325 (65 FE) MG tablet Take 325 mg by mouth daily with breakfast.    . lisinopril (PRINIVIL,ZESTRIL) 2.5 MG  tablet TAKE ONE TABLET BY MOUTH AT BEDTIME 30 tablet 1  . rivaroxaban (XARELTO) 20 MG TABS tablet Take 1 tablet (20 mg total) by mouth daily with supper. 30 tablet 2  . tamsulosin (FLOMAX) 0.4 MG CAPS Take 0.4 mg by mouth daily after breakfast.     . oxyCODONE (OXY IR/ROXICODONE) 5 MG immediate release tablet Take 1-2 tablets (5-10 mg total) every 6 (six) hours as needed by mouth for moderate pain. (Patient not taking: Reported on 12/08/2017) 40 tablet 0   No current facility-administered medications for this encounter.      BP 128/72   Pulse 78   Wt 75.4 kg (166 lb 3.2 oz)   SpO2 98%   BMI 26.09 kg/m  General: NAD Neck: No JVD, no thyromegaly or thyroid nodule.  Lungs: Clear to auscultation bilaterally with normal respiratory effort. CV: Nondisplaced PMI.  Heart irregular S1/S2, no S3/S4, no murmur.  No peripheral edema.  No carotid bruit.  Normal pedal pulses.  Abdomen: Soft, nontender, no hepatosplenomegaly, no distention.  Skin: Intact without lesions or rashes.  Neurologic: Alert and oriented x 3.  Psych: Normal affect. Extremities: No clubbing or cyanosis.  HEENT: Normal.   Assessment/Plan: 1. CAD: S/p CABG.  No chest pain or dyspnea.  Cardiolite in 10/17 with prior lateral MI but no ischemia.  - No ASA given stable CAD and Xarelto use.  - Continue statin.  2. Hyperlipidemia: PCP did lipids recently, will ask for copy.  Goal LDL < 70.   3. Chronic systolic CHF: Echo in 6/01 showed EF 40% with wall motion abnormalities.  He is not volume overloaded on exam, NYHA class I-II symptoms. - He is off Lasix, can continue to stay off.   - Continue Coreg 3.125 mg bid.   - Continue lisinopril 2.5 mg daily. Recent BMET from PCP's office, will try to get copy.  - Will not titrate up BB/ACEI as he continues to occasionally run BP down as low as 09N systolic.  - Repeat echo.    4. Atrial fibrillation:  He does not feel the atrial fibrillation, not sure how long it has been present.  He failed cardioversion in 9/17. He is not a good candidate for antiarrhythmics given baseline long QT and history of CAD.    - Continue low dose Coreg.  - Continue Xarelto. Recent CBC at PCP's office, will try to get copy.  5. Suspect sleep apnea: I have encouraged him in the past to get a sleep study but he is not interested.  Followup in 6 months   Loralie Champagne 12/10/2017

## 2017-12-22 ENCOUNTER — Ambulatory Visit (HOSPITAL_COMMUNITY)
Admission: RE | Admit: 2017-12-22 | Discharge: 2017-12-22 | Disposition: A | Discharge status: Home / Self Care | Payer: Medicare Other | Source: Ambulatory Visit | Attending: Internal Medicine | Admitting: Internal Medicine

## 2017-12-22 DIAGNOSIS — I5022 Chronic systolic (congestive) heart failure: Secondary | ICD-10-CM | POA: Diagnosis not present

## 2017-12-22 DIAGNOSIS — I2510 Atherosclerotic heart disease of native coronary artery without angina pectoris: Secondary | ICD-10-CM | POA: Insufficient documentation

## 2017-12-22 DIAGNOSIS — I4891 Unspecified atrial fibrillation: Secondary | ICD-10-CM | POA: Diagnosis not present

## 2017-12-22 DIAGNOSIS — I361 Nonrheumatic tricuspid (valve) insufficiency: Secondary | ICD-10-CM | POA: Diagnosis not present

## 2017-12-22 NOTE — Progress Notes (Deleted)
  Echocardiogram 2D Echocardiogram has been performed.  Cody Coleman 12/22/2017, 11:15 AM

## 2017-12-22 NOTE — Progress Notes (Signed)
Echocardiogram 2D Echocardiogram has been performed.  Cody Coleman 12/22/2017, 11:11 AM

## 2017-12-25 ENCOUNTER — Encounter (HOSPITAL_COMMUNITY): Payer: Self-pay

## 2017-12-27 ENCOUNTER — Other Ambulatory Visit (HOSPITAL_COMMUNITY): Payer: Self-pay

## 2017-12-27 ENCOUNTER — Telehealth (HOSPITAL_COMMUNITY): Payer: Self-pay

## 2017-12-27 ENCOUNTER — Encounter (HOSPITAL_COMMUNITY): Payer: Self-pay

## 2017-12-27 DIAGNOSIS — I5022 Chronic systolic (congestive) heart failure: Secondary | ICD-10-CM

## 2017-12-27 NOTE — Telephone Encounter (Signed)
Spoke with pt to schedule tee. Told pt tee is scheduled for dec 18th at 3pm and to arrive at 1pm. Pt agrees and verbalizes understanding.

## 2018-01-09 DIAGNOSIS — Z85048 Personal history of other malignant neoplasm of rectum, rectosigmoid junction, and anus: Secondary | ICD-10-CM | POA: Diagnosis not present

## 2018-01-15 DIAGNOSIS — Z85048 Personal history of other malignant neoplasm of rectum, rectosigmoid junction, and anus: Secondary | ICD-10-CM | POA: Diagnosis not present

## 2018-01-17 ENCOUNTER — Other Ambulatory Visit: Payer: Self-pay

## 2018-01-17 ENCOUNTER — Ambulatory Visit (HOSPITAL_BASED_OUTPATIENT_CLINIC_OR_DEPARTMENT_OTHER): Payer: Medicare Other

## 2018-01-17 ENCOUNTER — Encounter (HOSPITAL_COMMUNITY): Payer: Self-pay | Admitting: Cardiology

## 2018-01-17 ENCOUNTER — Encounter (HOSPITAL_COMMUNITY)
Admission: RE | Disposition: A | Discharge status: Home / Self Care | Payer: Self-pay | Source: Home / Self Care | Attending: Cardiology

## 2018-01-17 ENCOUNTER — Ambulatory Visit (HOSPITAL_COMMUNITY)
Admission: RE | Admit: 2018-01-17 | Discharge: 2018-01-17 | Disposition: A | Discharge status: Home / Self Care | Payer: Medicare Other | Attending: Cardiology | Admitting: Cardiology

## 2018-01-17 DIAGNOSIS — I5022 Chronic systolic (congestive) heart failure: Secondary | ICD-10-CM | POA: Insufficient documentation

## 2018-01-17 DIAGNOSIS — I4891 Unspecified atrial fibrillation: Secondary | ICD-10-CM

## 2018-01-17 DIAGNOSIS — I420 Dilated cardiomyopathy: Secondary | ICD-10-CM | POA: Insufficient documentation

## 2018-01-17 DIAGNOSIS — I517 Cardiomegaly: Secondary | ICD-10-CM | POA: Diagnosis not present

## 2018-01-17 HISTORY — PX: TEE WITHOUT CARDIOVERSION: SHX5443

## 2018-01-17 SURGERY — ECHOCARDIOGRAM, TRANSESOPHAGEAL
Anesthesia: Moderate Sedation

## 2018-01-17 MED ORDER — BUTAMBEN-TETRACAINE-BENZOCAINE 2-2-14 % EX AERO
INHALATION_SPRAY | CUTANEOUS | Status: DC | PRN
  Administered 2018-01-17: 2 via TOPICAL

## 2018-01-17 MED ORDER — MIDAZOLAM HCL (PF) 10 MG/2ML IJ SOLN
INTRAMUSCULAR | Status: DC | PRN
  Administered 2018-01-17: 2 mg via INTRAVENOUS
  Administered 2018-01-17: 1 mg via INTRAVENOUS
  Administered 2018-01-17 (×2): 2 mg via INTRAVENOUS

## 2018-01-17 MED ORDER — SODIUM CHLORIDE 0.9 % IV SOLN: INTRAVENOUS | Status: DC

## 2018-01-17 MED ORDER — FENTANYL CITRATE (PF) 100 MCG/2ML IJ SOLN
INTRAMUSCULAR | Status: AC
  Filled 2018-01-17: qty 2

## 2018-01-17 MED ORDER — FENTANYL CITRATE (PF) 100 MCG/2ML IJ SOLN
INTRAMUSCULAR | Status: DC | PRN
  Administered 2018-01-17 (×3): 25 ug via INTRAVENOUS

## 2018-01-17 MED ORDER — MIDAZOLAM HCL (PF) 5 MG/ML IJ SOLN
INTRAMUSCULAR | Status: AC
  Filled 2018-01-17: qty 2

## 2018-01-17 NOTE — H&P (Signed)
Symptomatically no change from prior office note with me.   Echo was done, showing questionable RA mass.  We are doing TEE to assess.  I discussed risks/benefits with patient and he agrees to procedure.   Loralie Champagne 01/17/2018 3:07 PM

## 2018-01-17 NOTE — Progress Notes (Signed)
  Echocardiogram Echocardiogram Transesophageal has been performed.  Cody Coleman 01/17/2018, 3:44 PM

## 2018-01-17 NOTE — CV Procedure (Signed)
Procedure: TEE  Indication: Right atrial mass  Sedation: Versed 7 mg IV, Fentanyl 75 mcg IV  Findings: Please see echo section for full report.  Normal LV size with EF 50%, mild diffuse hypokinesis.  Mildly dilated RV with mildly decreased systolic function.  Moderate left atrial enlargement, no LA appendage thrombus.  Moderate right atrial enlargement.  No mass seen in the right atrium.  There was a Chiari network.   No PFO or ASD by color doppler. Trivial TR, peak RV-RA gradient 19 mmHg. Trivial MR.  Trileaflet aortic valve with no stenosis, trivial AI.  Normal caliber thoracic aorta with grade III plaque descending thoracic aorta.    Impression: No RA mass, Chiari network noted.   Cody Coleman 01/17/2018 3:27 PM

## 2018-01-17 NOTE — Discharge Instructions (Signed)

## 2018-02-26 DIAGNOSIS — J9801 Acute bronchospasm: Secondary | ICD-10-CM | POA: Diagnosis not present

## 2018-02-26 DIAGNOSIS — I4891 Unspecified atrial fibrillation: Secondary | ICD-10-CM | POA: Diagnosis not present

## 2018-02-26 DIAGNOSIS — Z6827 Body mass index (BMI) 27.0-27.9, adult: Secondary | ICD-10-CM | POA: Diagnosis not present

## 2018-02-26 DIAGNOSIS — C20 Malignant neoplasm of rectum: Secondary | ICD-10-CM | POA: Diagnosis not present

## 2018-02-26 DIAGNOSIS — I5022 Chronic systolic (congestive) heart failure: Secondary | ICD-10-CM | POA: Diagnosis not present

## 2018-02-26 DIAGNOSIS — J329 Chronic sinusitis, unspecified: Secondary | ICD-10-CM | POA: Diagnosis not present

## 2018-02-26 DIAGNOSIS — E663 Overweight: Secondary | ICD-10-CM | POA: Diagnosis not present

## 2018-03-29 DIAGNOSIS — Z6826 Body mass index (BMI) 26.0-26.9, adult: Secondary | ICD-10-CM | POA: Diagnosis not present

## 2018-03-29 DIAGNOSIS — J9801 Acute bronchospasm: Secondary | ICD-10-CM | POA: Diagnosis not present

## 2018-03-29 DIAGNOSIS — R42 Dizziness and giddiness: Secondary | ICD-10-CM | POA: Diagnosis not present

## 2018-03-29 DIAGNOSIS — Z1389 Encounter for screening for other disorder: Secondary | ICD-10-CM | POA: Diagnosis not present

## 2018-03-29 DIAGNOSIS — I5022 Chronic systolic (congestive) heart failure: Secondary | ICD-10-CM | POA: Diagnosis not present

## 2018-03-29 DIAGNOSIS — I4891 Unspecified atrial fibrillation: Secondary | ICD-10-CM | POA: Diagnosis not present

## 2018-03-29 DIAGNOSIS — J329 Chronic sinusitis, unspecified: Secondary | ICD-10-CM | POA: Diagnosis not present

## 2018-04-24 ENCOUNTER — Other Ambulatory Visit (HOSPITAL_COMMUNITY): Payer: Self-pay | Admitting: Physician Assistant

## 2018-04-24 DIAGNOSIS — R05 Cough: Secondary | ICD-10-CM

## 2018-04-24 DIAGNOSIS — R059 Cough, unspecified: Secondary | ICD-10-CM

## 2018-04-26 ENCOUNTER — Other Ambulatory Visit (HOSPITAL_COMMUNITY): Payer: Self-pay | Admitting: Pulmonary Disease

## 2018-04-26 ENCOUNTER — Other Ambulatory Visit: Payer: Self-pay

## 2018-04-26 ENCOUNTER — Ambulatory Visit (HOSPITAL_COMMUNITY)
Admission: RE | Admit: 2018-04-26 | Discharge: 2018-04-26 | Disposition: A | Discharge status: Home / Self Care | Payer: Medicare Other | Source: Ambulatory Visit | Attending: Pulmonary Disease | Admitting: Pulmonary Disease

## 2018-04-26 DIAGNOSIS — R05 Cough: Secondary | ICD-10-CM | POA: Diagnosis not present

## 2018-04-26 DIAGNOSIS — R058 Other specified cough: Secondary | ICD-10-CM

## 2018-04-26 DIAGNOSIS — J209 Acute bronchitis, unspecified: Secondary | ICD-10-CM | POA: Diagnosis not present

## 2018-04-26 DIAGNOSIS — I482 Chronic atrial fibrillation, unspecified: Secondary | ICD-10-CM | POA: Diagnosis not present

## 2018-04-26 DIAGNOSIS — I2510 Atherosclerotic heart disease of native coronary artery without angina pectoris: Secondary | ICD-10-CM | POA: Diagnosis not present

## 2018-06-09 ENCOUNTER — Encounter (HOSPITAL_COMMUNITY): Payer: Self-pay

## 2018-06-09 ENCOUNTER — Emergency Department (HOSPITAL_COMMUNITY): Payer: Medicare Other

## 2018-06-09 ENCOUNTER — Other Ambulatory Visit: Payer: Self-pay

## 2018-06-09 ENCOUNTER — Emergency Department (HOSPITAL_COMMUNITY)
Admission: EM | Admit: 2018-06-09 | Discharge: 2018-06-09 | Disposition: A | Discharge status: Home / Self Care | Payer: Medicare Other | Attending: Emergency Medicine | Admitting: Emergency Medicine

## 2018-06-09 DIAGNOSIS — Z20828 Contact with and (suspected) exposure to other viral communicable diseases: Secondary | ICD-10-CM | POA: Diagnosis not present

## 2018-06-09 DIAGNOSIS — Z87891 Personal history of nicotine dependence: Secondary | ICD-10-CM | POA: Insufficient documentation

## 2018-06-09 DIAGNOSIS — J449 Chronic obstructive pulmonary disease, unspecified: Secondary | ICD-10-CM | POA: Diagnosis not present

## 2018-06-09 DIAGNOSIS — I5023 Acute on chronic systolic (congestive) heart failure: Secondary | ICD-10-CM | POA: Diagnosis not present

## 2018-06-09 DIAGNOSIS — Z7901 Long term (current) use of anticoagulants: Secondary | ICD-10-CM | POA: Insufficient documentation

## 2018-06-09 DIAGNOSIS — I252 Old myocardial infarction: Secondary | ICD-10-CM | POA: Diagnosis not present

## 2018-06-09 DIAGNOSIS — Z03818 Encounter for observation for suspected exposure to other biological agents ruled out: Secondary | ICD-10-CM | POA: Diagnosis not present

## 2018-06-09 DIAGNOSIS — I2510 Atherosclerotic heart disease of native coronary artery without angina pectoris: Secondary | ICD-10-CM | POA: Insufficient documentation

## 2018-06-09 DIAGNOSIS — R0602 Shortness of breath: Secondary | ICD-10-CM

## 2018-06-09 DIAGNOSIS — Z79899 Other long term (current) drug therapy: Secondary | ICD-10-CM | POA: Diagnosis not present

## 2018-06-09 DIAGNOSIS — R05 Cough: Secondary | ICD-10-CM | POA: Diagnosis not present

## 2018-06-09 LAB — CBC WITH DIFFERENTIAL/PLATELET
Abs Immature Granulocytes: 0.07 10*3/uL (ref 0.00–0.07)
Basophils Absolute: 0.1 10*3/uL (ref 0.0–0.1)
Basophils Relative: 1 %
Eosinophils Absolute: 0.4 10*3/uL (ref 0.0–0.5)
Eosinophils Relative: 4 %
HCT: 42 % (ref 39.0–52.0)
Hemoglobin: 14.2 g/dL (ref 13.0–17.0)
Immature Granulocytes: 1 %
Lymphocytes Relative: 13 %
Lymphs Abs: 1.3 10*3/uL (ref 0.7–4.0)
MCH: 31.8 pg (ref 26.0–34.0)
MCHC: 33.8 g/dL (ref 30.0–36.0)
MCV: 94 fL (ref 80.0–100.0)
Monocytes Absolute: 1.2 10*3/uL — ABNORMAL HIGH (ref 0.1–1.0)
Monocytes Relative: 12 %
Neutro Abs: 7.1 10*3/uL (ref 1.7–7.7)
Neutrophils Relative %: 69 %
Platelets: 198 10*3/uL (ref 150–400)
RBC: 4.47 MIL/uL (ref 4.22–5.81)
RDW: 13.3 % (ref 11.5–15.5)
WBC: 10 10*3/uL (ref 4.0–10.5)
nRBC: 0 % (ref 0.0–0.2)

## 2018-06-09 LAB — SEDIMENTATION RATE: Sed Rate: 10 mm/hr (ref 0–16)

## 2018-06-09 LAB — TROPONIN I: Troponin I: <0.03 ng/mL (ref <0.03)

## 2018-06-09 LAB — BASIC METABOLIC PANEL
Anion gap: 9 (ref 5–15)
BUN: 17 mg/dL (ref 8–23)
CO2: 24 mmol/L (ref 22–32)
Calcium: 8.8 mg/dL — ABNORMAL LOW (ref 8.9–10.3)
Chloride: 107 mmol/L (ref 98–111)
Creatinine, Ser: 0.8 mg/dL (ref 0.61–1.24)
GFR calc Af Amer: >60 mL/min (ref >60)
GFR calc non Af Amer: >60 mL/min (ref >60)
Glucose, Bld: 93 mg/dL (ref 70–99)
Potassium: 4.3 mmol/L (ref 3.5–5.1)
Sodium: 140 mmol/L (ref 135–145)

## 2018-06-09 LAB — SARS CORONAVIRUS 2 BY RT PCR (HOSPITAL ORDER, PERFORMED IN ~~LOC~~ HOSPITAL LAB): SARS Coronavirus 2: NEGATIVE

## 2018-06-09 LAB — C-REACTIVE PROTEIN: CRP: 1.1 mg/dL — ABNORMAL HIGH (ref <1.0)

## 2018-06-09 LAB — BRAIN NATRIURETIC PEPTIDE: B Natriuretic Peptide: 192.7 pg/mL — ABNORMAL HIGH (ref 0.0–100.0)

## 2018-06-09 MED ORDER — POTASSIUM CHLORIDE ER 10 MEQ PO TBCR: 10.0000 meq | EXTENDED_RELEASE_TABLET | Freq: Every day | ORAL | 0 refills | Status: DC

## 2018-06-09 MED ORDER — FUROSEMIDE 10 MG/ML IJ SOLN
40.0000 mg | Freq: Once | INTRAMUSCULAR | Status: AC
  Administered 2018-06-09: 15:00:00 40 mg via INTRAVENOUS
  Filled 2018-06-09: qty 4

## 2018-06-09 MED ORDER — FUROSEMIDE 20 MG PO TABS: 20.0000 mg | ORAL_TABLET | Freq: Every day | ORAL | 0 refills | Status: DC

## 2018-06-09 MED ORDER — ALBUTEROL SULFATE HFA 108 (90 BASE) MCG/ACT IN AERS
2.0000 | INHALATION_SPRAY | RESPIRATORY_TRACT | Status: DC | PRN
  Filled 2018-06-09: qty 6.7

## 2018-06-09 NOTE — ED Provider Notes (Signed)
[facility] Provider Note   CSN: [id number] Arrival date & time: 06/09/18  1234    History   Chief Complaint Chief Complaint  Patient presents with  . Cough  . Shortness of Breath    HPI Cody Coleman is a 73 y.o. male.      73yo male with history of a-fib on Xarelto, rectal cancer with colostomy in place, MI, woke up today with chest tightness/burning feeling diffuse chest and worsening SHOB, did a neb treatment at home and called PCP, advised to come to the ER. Reports fever at home of 100 last night. On coughing cough since January, worse lately (past month). Reports violent coughing episodes that result in labored breathing. Also sore throat, constant body aches, cough is non productive. Reports intermittent fevers since January lasting a day or so. Unknown if ever tested for pertussis. Prior to January did not have any lung troubles.   Has been seen by PCP multiple times and given several different antibiotics before sent to pulmonology (Dr. Luan Pulling) who has also tried antibiotics. Head congestion improved slightly with the antibiotics but the respiratory complaints have not improved. Currently on Augmentin. Has colostomy, reports normal output. Patient reports military experience in [place] with exposure to agent orange.      Past Medical History:  Diagnosis Date  . Atrial fibrillation ([place]) 12/15/2016  . BPH (benign prostatic hyperplasia)   . CAD (coronary artery disease)   . Cancer (H[facility])    RECTAL CANCER--SOME RECTAL BLEEDING  . Former tobacco use   . High cholesterol   . MI (myocardial infarction) ([place])    x 2  AT AGE 79 AND AT AGE 72  DR. [facility] IS PT'S MEDICAL DOCTOR  . Pain    RIGHT KNEE PAIN AND OCCAS OTHER JOINT PAINS  . Sleep difficulties    NEVER SLEEPS WELL - WAKES UP AFTER SEVERAL HOURS AND NOT ABLE TO GO BACK TO SLEEP--DID SLEEP STUDY AND TOLD HE DID NOT HAVE SLEEP APNEA  . Urgency-frequency syndrome    FLOMAX HAS  HELPED    Patient Active Problem List   Diagnosis Date Noted  . Recurrent incisional hernia with incarceration s/p lap repair w mesh 12/15/2016 12/15/2016  . Chronic systolic CHF (congestive heart failure)  12/15/2016  . Atrial fibrillation ([place]) 12/15/2016  . Bradycardia - intermittent 12/15/2016  . Chronic anticoagulation 12/15/2016  . Sleep difficulties   . CAD (coronary artery disease)   . BPH (benign prostatic hyperplasia)   . Parastomal hernia without obstruction or gangrene 12/14/2016  . Recurrent Parastomal & Incisional hernias s/p lap repair w mesh 12/15/2016 11/22/2016  . COPD exacerbation ([place]) 01/21/2013  . Constipation, chronic 01/20/2013  . Rectal cancer s/p APR/colostomy 06/11/2012 05/03/2012  . High cholesterol 04/12/2012  . CAD (coronary artery disease) of artery bypass graft 04/12/2012    Past Surgical History:  Procedure Laterality Date  . CARDIOVERSION N/A 10/21/2015   Procedure: CARDIOVERSION;  Surgeon: Larey Dresser, MD;  Location: [place];  Service: Cardiovascular;  Laterality: N/A;  . COLONOSCOPY N/A 04/20/2012   Procedure: COLONOSCOPY;  Surgeon: Rogene Houston, MD;  [facility];  Service: Endoscopy;  Laterality: N/A;  225  . COLONOSCOPY N/A 06/14/2013   Procedure: COLONOSCOPY;  Surgeon: Rogene Houston, MD;  [facility];  Service: Endoscopy;  Laterality: N/A;  1245  . COLOSTOMY REVISION N/A 01/17/2013   Procedure: COLOSTOMY REVISION;  Surgeon: Leighton Ruff, MD;  Location: [place];  Service:  General;  Laterality: N/A;  . CORONARY ANGIOPLASTY WITH STENT PLACEMENT     before bypass  . CORONARY ARTERY BYPASS GRAFT     2005  . EUS N/A 05/03/2012   Procedure: LOWER ENDOSCOPIC ULTRASOUND (EUS);  Surgeon: Milus Banister, MD;  Location: [facility];  Service: Endoscopy;  Laterality: N/A;  . INSERTION OF MESH N/A 10/25/2013   Procedure: INSERTION OF STRATTICE MESH;  Surgeon: Leighton Ruff, MD;  Location: [facility];  Service: General;   Laterality: N/A;  . LAPAROSCOPIC ASSISTED ABDOMINAL PERINEAL RESECTION N/A 06/11/2012   Procedure: LAPAROSCOPIC ASSISTED ABDOMINAL PERINEAL RESECTION;  Surgeon: Leighton Ruff, MD;  Location: [facility];  Service: General;  Laterality: N/A;  . LAPAROSCOPIC PARASTOMAL HERNIA N/A 10/25/2013   Procedure: ROBOTIC PARASTOMAL HERNIA REPAIR WITH STRATTICE MESH;  Surgeon: Leighton Ruff, MD;  Location: [facility];  Service: General;  Laterality: N/A;  . PARASTOMAL HERNIA REPAIR N/A 12/14/2016   Procedure: PREPARE OF RECURRENT PERISTOMAL HERNIA WITH MESH WITH LAPAROSCOPIC LYSIS OF ADHESIONS, AND INCISIONAL HERNIA REPAIR WITH MESH;  Surgeon: Michael Boston, MD;  Location: [facility];  Service: General;  Laterality: N/A;  . TEE WITHOUT CARDIOVERSION N/A 01/17/2018   Procedure: TRANSESOPHAGEAL ECHOCARDIOGRAM (TEE);  Surgeon: Larey Dresser, MD;  Location: [facility];  Service: Cardiovascular;  Laterality: N/A;  . TONSILLECTOMY          Home Medications    Prior to Admission medications   Medication Sig Start Date End Date Taking? Authorizing Provider  ALPRAZolam Duanne Moron) 1 MG tablet Take 1 mg by mouth 4 (four) times daily as needed for anxiety or sleep.     [provider]  Ascorbic Acid (VITAMIN C PO) Take 1 tablet by mouth daily.    [provider]  atorvastatin (LIPITOR) 40 MG tablet TAKE 1 TABLET BY MOUTH DAILY Patient taking differently: Take 40 mg by mouth daily.  06/05/14   Larey Dresser, MD  carvedilol (COREG) 3.125 MG tablet TAKE ONE TABLET BY MOUTH TWICE DAILY Patient taking differently: Take 3.125 mg by mouth 2 (two) times daily with a meal.  08/28/17   Larey Dresser, MD  ferrous sulfate 325 (65 FE) MG tablet Take 325 mg by mouth daily with breakfast.    [provider]  furosemide (LASIX) 20 MG tablet Take 1 tablet (20 mg total) by mouth daily for 4 days. 06/09/18 06/13/18  Tacy Learn, PA-C  Lifitegrast Shirley Friar) 5 % SOLN Place 1 drop into both eyes 2 (two) times daily.     [provider]  lisinopril (PRINIVIL,ZESTRIL) 2.5 MG tablet TAKE ONE TABLET BY MOUTH AT BEDTIME Patient taking differently: Take 2.5 mg by mouth at bedtime.  08/28/17   Larey Dresser, MD  potassium chloride (K-DUR) 10 MEQ tablet Take 1 tablet (10 mEq total) by mouth daily for 4 days. 06/09/18 06/13/18  Tacy Learn, PA-C  rivaroxaban (XARELTO) 20 MG TABS tablet Take 1 tablet (20 mg total) by mouth daily with supper. 11/10/17   Bensimhon, Shaune Pascal, MD  tamsulosin (FLOMAX) 0.4 MG CAPS Take 0.4 mg by mouth daily after breakfast.     [provider]    Family History Family History  Problem Relation Age of Onset  . Congestive Heart Failure Mother   . Stroke Father   . Coronary artery disease Father   . Coronary artery disease Brother   . Colon cancer Neg Hx     Social History Social History   Tobacco Use  . Smoking status: Former  Smoker    Packs/day: 1.00    Years: 30.00    Pack years: 30.00    Types: Cigarettes    Last attempt to quit: 01/31/2002    Years since quitting: 16.3  . Smokeless tobacco: Never Used  Substance Use Topics  . Alcohol use: Yes    Comment: rarely  . Drug use: No     Allergies   Nsaids   Review of Systems Review of Systems  Constitutional: Positive for chills, diaphoresis and fever. Negative for appetite change.  HENT: Positive for congestion and postnasal drip. Negative for sore throat.        Post nasal drip has improved with Augmentin.  Respiratory: Positive for cough, chest tightness and shortness of breath.   Cardiovascular: Negative for chest pain.  Gastrointestinal: Negative for abdominal pain, nausea and vomiting.  Genitourinary: Negative for decreased urine volume and difficulty urinating.  Musculoskeletal: Positive for arthralgias and myalgias.  Skin: Negative for rash and wound.  Allergic/Immunologic: Negative for immunocompromised state.  Neurological: Negative for dizziness and weakness.  All other systems  reviewed and are negative.    Physical Exam Updated Vital Signs BP (!) 116/59   Pulse (!) 52   Temp 97.9 F (36.6 C) (Oral)   Resp (!) 22   Ht 5\' 7"  (1.702 m)   Wt 75.3 kg   SpO2 97%   BMI 26.00 kg/m   Physical Exam Vitals signs and nursing note reviewed.  Constitutional:      General: He is not in acute distress.    Appearance: He is well-developed. He is not diaphoretic.  HENT:     Head: Normocephalic and atraumatic.  Neck:     Musculoskeletal: Neck supple.  Cardiovascular:     Rate and Rhythm: Normal rate. Rhythm irregular.  Pulmonary:     Effort: Pulmonary effort is normal.     Breath sounds: Decreased breath sounds and wheezing present. No rhonchi or rales.  Chest:     Chest wall: No deformity or tenderness.  Musculoskeletal:     Right lower leg: No edema.     Left lower leg: No edema.  Skin:    General: Skin is warm and dry.     Findings: No erythema.  Neurological:     Mental Status: He is alert and oriented to person, place, and time.  Psychiatric:        Behavior: Behavior normal.      ED Treatments / Results  Labs (all labs ordered are listed, but only abnormal results are displayed) Labs Reviewed  BASIC METABOLIC PANEL - Abnormal; Notable for the following components:      Result Value   Calcium 8.8 (*)    All other components within normal limits  CBC WITH DIFFERENTIAL/PLATELET - Abnormal; Notable for the following components:   Monocytes Absolute 1.2 (*)    All other components within normal limits  BRAIN NATRIURETIC PEPTIDE - Abnormal; Notable for the following components:   B Natriuretic Peptide 192.7 (*)    All other components within normal limits  C-REACTIVE PROTEIN - Abnormal; Notable for the following components:   CRP 1.1 (*)    All other components within normal limits  SARS CORONAVIRUS 2 (HOSPITAL ORDER, [facility])  BORDETELLA PERTUSSIS PCR  TROPONIN I  SEDIMENTATION RATE  B. BURGDORFI ANTIBODIES     EKG EKG Interpretation  Date/Time:  Saturday Jun 09 2018 12:46:44 EDT Ventricular Rate:  68 PR Interval:    QRS Duration: 108  QT Interval:  437 QTC Calculation: 465 R Axis:   66 Text Interpretation:  Atrial fibrillation Low voltage, precordial leads Confirmed by Elnora Morrison [phone]) on 06/09/2018 12:49:49 PM   Radiology Dg Chest Port 1 View  Result Date: 06/09/2018 CLINICAL DATA:  Shortness of breath and cough for several months. EXAM: PORTABLE CHEST 1 VIEW COMPARISON:  04/26/2018 chest radiograph and prior studies FINDINGS: Cardiomegaly and CABG changes again noted. Mild chronic peribronchial thickening again noted. There is no evidence of focal airspace disease, pulmonary edema, suspicious pulmonary nodule/mass, pleural effusion, or pneumothorax. No acute bony abnormalities are identified. IMPRESSION: Cardiomegaly without evidence of acute cardiopulmonary disease. Electronically Signed   By: Margarette Canada M.D.   On: 06/09/2018 14:01    Procedures Procedures (including critical care time)  Medications Ordered in ED Medications  albuterol (VENTOLIN HFA) 108 (90 Base) MCG/ACT inhaler 2 puff (has no administration in time range)  furosemide (LASIX) injection 40 mg (40 mg Intravenous Given 06/09/18 1515)     Initial Impression / Assessment and Plan / ED Course  I have reviewed the triage vital signs and the nursing notes.  Pertinent labs & imaging results that were available during my care of the patient were reviewed by me and considered in my medical decision making (see chart for details).  Clinical Course as of Jun 09 1518  Sat May 09, 20106  [id number] 73 year old male presents today with complaint of fever, cough, shortness of breath.  Patient states the symptoms started back in January, have been intermittent, seen by PCP and pulmonology with multiple courses of antibiotics and prednisone which have not been effective.  Patient has been using nebulizers and inhaler which she states he  has not needed prior to January when his symptoms started.  Patient presents today due to [place] pandemic and report of fever with his symptoms.  Patient's cover test is negative, BMP is unremarkable, BNP is mildly elevated at 192.  CRP mildly elevated at 1.1, sed rate normal, troponin negative, CBC within normal months.  Chest x-ray unremarkable.  Patient has wheezing on exam, treated with albuterol inhaler.  Pertussis sent out for testing due to history of severe prolonged coughing episodes.  Lyme testing sent out due to report of severe body aches. Case discussed with Dr. Reather Converse, ER attending, who has seen the patient and agrees with plan of care, lasix for the next 4 days (dose given in the ER prior to dc) with rx for k-dur with close follow up with PCP, return to ER for new or worsening symptoms.    [LM]  [place] was evaluated in Emergency Department on 06/09/2018 for the symptoms described in the history of present illness. He was evaluated in the context of the global COVID-19 pandemic, which necessitated consideration that the patient might be at risk for infection with the SARS-CoV-2 virus that causes COVID-19. Institutional protocols and algorithms that pertain to the evaluation of patients at risk for COVID-19 are in a state of rapid change based on information released by regulatory bodies including the CDC and federal and state organizations. These policies and algorithms were followed during the patient's care in the ED.     [LM]    Clinical Course User Index [LM] Tacy Learn, PA-C      Final Clinical Impressions(s) / ED Diagnoses   Final diagnoses:  Shortness of breath  Acute on chronic systolic congestive heart failure [facility])    ED Discharge Orders  Ordered    furosemide (LASIX) 20 MG tablet  Daily     06/09/18 1508    potassium chloride (K-DUR) 10 MEQ tablet  Daily     06/09/18 1509           Tacy Learn, PA-C 06/09/18 1521    Elnora Morrison,  MD 06/09/18 [phone]

## 2018-06-09 NOTE — Discharge Instructions (Addendum)
Take Lasix as prescribed.  Take potassium supplement daily as prescribed. Follow up with your doctor this week. Lyme titers sent for reports of body aches. Pertussis swab sent out to check for whooping cough due to history of prolonged and severe coughing episodes.

## 2018-06-09 NOTE — ED Triage Notes (Signed)
Pt arrives POV for eval of SOB/Cough since January. Pt has been on 5 courses of abx w/ no improvement. Sent here today by PCP for further eval of poss Covid testing. Pt speaking in 3-4 word sentences w/ mild dyspnea at rest. Afebrile, non hypoxic, non tachycardic. Pt reports tightness in chest which has been ongoing since January, worse in the last few days. Reports some relief after albuterol inhaler at home today.

## 2018-06-11 DIAGNOSIS — I482 Chronic atrial fibrillation, unspecified: Secondary | ICD-10-CM | POA: Diagnosis not present

## 2018-06-11 DIAGNOSIS — I509 Heart failure, unspecified: Secondary | ICD-10-CM | POA: Diagnosis not present

## 2018-06-11 LAB — B. BURGDORFI ANTIBODIES: B burgdorferi Ab IgG+IgM: <0.91 {ISR} (ref 0.00–0.90)

## 2018-06-14 LAB — BORDETELLA PERTUSSIS PCR
B parapertussis, DNA: NEGATIVE
B pertussis, DNA: NEGATIVE

## 2018-10-03 ENCOUNTER — Ambulatory Visit (HOSPITAL_COMMUNITY)
Admission: RE | Admit: 2018-10-03 | Discharge: 2018-10-03 | Disposition: A | Discharge status: Home / Self Care | Payer: Medicare Other | Source: Ambulatory Visit | Attending: Internal Medicine | Admitting: Internal Medicine

## 2018-10-03 ENCOUNTER — Other Ambulatory Visit: Payer: Self-pay

## 2018-10-03 ENCOUNTER — Other Ambulatory Visit (HOSPITAL_COMMUNITY): Payer: Self-pay | Admitting: Internal Medicine

## 2018-10-03 DIAGNOSIS — R05 Cough: Secondary | ICD-10-CM

## 2018-10-03 DIAGNOSIS — C20 Malignant neoplasm of rectum: Secondary | ICD-10-CM | POA: Diagnosis not present

## 2018-10-03 DIAGNOSIS — F419 Anxiety disorder, unspecified: Secondary | ICD-10-CM | POA: Diagnosis not present

## 2018-10-03 DIAGNOSIS — Z79899 Other long term (current) drug therapy: Secondary | ICD-10-CM | POA: Diagnosis not present

## 2018-10-03 DIAGNOSIS — N400 Enlarged prostate without lower urinary tract symptoms: Secondary | ICD-10-CM | POA: Diagnosis not present

## 2018-10-03 DIAGNOSIS — R059 Cough, unspecified: Secondary | ICD-10-CM

## 2018-10-03 DIAGNOSIS — R498 Other voice and resonance disorders: Secondary | ICD-10-CM | POA: Diagnosis not present

## 2018-10-03 DIAGNOSIS — J449 Chronic obstructive pulmonary disease, unspecified: Secondary | ICD-10-CM | POA: Diagnosis not present

## 2018-10-03 DIAGNOSIS — Z6826 Body mass index (BMI) 26.0-26.9, adult: Secondary | ICD-10-CM | POA: Diagnosis not present

## 2018-10-16 DIAGNOSIS — N400 Enlarged prostate without lower urinary tract symptoms: Secondary | ICD-10-CM | POA: Diagnosis not present

## 2018-10-16 DIAGNOSIS — N529 Male erectile dysfunction, unspecified: Secondary | ICD-10-CM | POA: Diagnosis not present

## 2018-10-16 DIAGNOSIS — F419 Anxiety disorder, unspecified: Secondary | ICD-10-CM | POA: Diagnosis not present

## 2018-10-16 DIAGNOSIS — I2510 Atherosclerotic heart disease of native coronary artery without angina pectoris: Secondary | ICD-10-CM | POA: Diagnosis not present

## 2018-10-23 ENCOUNTER — Other Ambulatory Visit: Payer: Self-pay | Admitting: Internal Medicine

## 2018-10-23 DIAGNOSIS — Z85038 Personal history of other malignant neoplasm of large intestine: Secondary | ICD-10-CM

## 2018-10-31 DIAGNOSIS — Z85048 Personal history of other malignant neoplasm of rectum, rectosigmoid junction, and anus: Secondary | ICD-10-CM | POA: Diagnosis not present

## 2018-11-07 ENCOUNTER — Other Ambulatory Visit: Payer: Self-pay | Admitting: General Surgery

## 2018-11-07 DIAGNOSIS — Z85048 Personal history of other malignant neoplasm of rectum, rectosigmoid junction, and anus: Secondary | ICD-10-CM

## 2018-11-15 ENCOUNTER — Ambulatory Visit
Admission: RE | Admit: 2018-11-15 | Discharge: 2018-11-15 | Disposition: A | Discharge status: Home / Self Care | Payer: Medicare Other | Source: Ambulatory Visit | Attending: General Surgery | Admitting: General Surgery

## 2018-11-15 ENCOUNTER — Other Ambulatory Visit: Payer: Self-pay

## 2018-11-15 DIAGNOSIS — C187 Malignant neoplasm of sigmoid colon: Secondary | ICD-10-CM | POA: Diagnosis not present

## 2018-11-15 DIAGNOSIS — Z85048 Personal history of other malignant neoplasm of rectum, rectosigmoid junction, and anus: Secondary | ICD-10-CM

## 2018-11-15 DIAGNOSIS — R911 Solitary pulmonary nodule: Secondary | ICD-10-CM | POA: Diagnosis not present

## 2018-11-15 MED ORDER — IOPAMIDOL (ISOVUE-300) INJECTION 61%
100.0000 mL | Freq: Once | INTRAVENOUS | Status: AC | PRN
  Administered 2018-11-15: 100 mL via INTRAVENOUS

## 2018-12-26 ENCOUNTER — Other Ambulatory Visit: Payer: Self-pay

## 2019-02-21 DIAGNOSIS — Z23 Encounter for immunization: Secondary | ICD-10-CM | POA: Diagnosis not present

## 2019-03-29 DIAGNOSIS — Z23 Encounter for immunization: Secondary | ICD-10-CM | POA: Diagnosis not present

## 2019-08-12 DIAGNOSIS — C20 Malignant neoplasm of rectum: Secondary | ICD-10-CM | POA: Diagnosis not present

## 2019-08-12 DIAGNOSIS — Z0000 Encounter for general adult medical examination without abnormal findings: Secondary | ICD-10-CM | POA: Diagnosis not present

## 2019-08-12 DIAGNOSIS — E782 Mixed hyperlipidemia: Secondary | ICD-10-CM | POA: Diagnosis not present

## 2019-08-12 DIAGNOSIS — E663 Overweight: Secondary | ICD-10-CM | POA: Diagnosis not present

## 2019-08-12 DIAGNOSIS — Z1389 Encounter for screening for other disorder: Secondary | ICD-10-CM | POA: Diagnosis not present

## 2019-08-12 DIAGNOSIS — I4891 Unspecified atrial fibrillation: Secondary | ICD-10-CM | POA: Diagnosis not present

## 2019-08-12 DIAGNOSIS — Z125 Encounter for screening for malignant neoplasm of prostate: Secondary | ICD-10-CM | POA: Diagnosis not present

## 2019-11-25 DIAGNOSIS — Z23 Encounter for immunization: Secondary | ICD-10-CM | POA: Diagnosis not present

## 2020-06-16 ENCOUNTER — Ambulatory Visit (HOSPITAL_COMMUNITY)
Admission: RE | Admit: 2020-06-16 | Discharge: 2020-06-16 | Disposition: A | Discharge status: Home / Self Care | Payer: Medicare Other | Source: Ambulatory Visit | Attending: Internal Medicine | Admitting: Internal Medicine

## 2020-06-16 ENCOUNTER — Other Ambulatory Visit: Payer: Self-pay

## 2020-06-16 ENCOUNTER — Other Ambulatory Visit (HOSPITAL_COMMUNITY): Payer: Self-pay | Admitting: Internal Medicine

## 2020-06-16 DIAGNOSIS — R0600 Dyspnea, unspecified: Secondary | ICD-10-CM

## 2020-06-23 ENCOUNTER — Telehealth (INDEPENDENT_AMBULATORY_CARE_PROVIDER_SITE_OTHER): Payer: Medicare Other | Admitting: Gastroenterology

## 2020-06-23 ENCOUNTER — Encounter (INDEPENDENT_AMBULATORY_CARE_PROVIDER_SITE_OTHER): Payer: Self-pay | Admitting: Gastroenterology

## 2020-06-23 ENCOUNTER — Other Ambulatory Visit: Payer: Self-pay

## 2020-06-23 DIAGNOSIS — D509 Iron deficiency anemia, unspecified: Secondary | ICD-10-CM | POA: Diagnosis not present

## 2020-06-23 NOTE — Patient Instructions (Signed)
Schedule EGD and colonoscopy - will need to obtain clearance from Dr. Marigene Ehlers Start oral iron supplementation after finishing doxycycline course

## 2020-06-23 NOTE — Progress Notes (Signed)
Cody Coleman, M.D. Gastroenterology & Hepatology [facility] [address] [place], [place] [address] Primary Care Physician: Redmond School, MD [address] [place] [address]  Referring MD: PCP  This is a telephone virtual visit.  It required patient-provider interaction for the medical decision making as documented below. The patient has consented and agreed to proceed with a Telehealth encounter given the current Coronavirus pandemic.  VIRTUAL VISIT NOTE Patient location: home Provider location: home  I will communicate my assessment and recommendations to the referring MD via EMR.  Chief Complaint: IDA  History of Present Illness: Cody Coleman is a 75 y.o. male with PMH atrial fibrillation, CAD s/p CABG, BPH, CAD, MI, longstanding history of iron deficiency anemia, HLD, rectal cancer  stage IIA disease status post proctectomy, who presents for evaluation of iron deficiency anemia.  Patient reports that prior to 2014 he was told he had some degree of anemia. The patient states he was on ferrous sulfate in the past but only took it for a month.He was referred to our clinic for further evaluation as his hemoglobin was lower recently, as well as he was presenting new onset of fatigue with exertion without chest pain. He was recently diagnosed with atypical pneumonia and was prescribed doxycycline. His last labs from 5/17 showed Hb 8.0 MCV 70 Hct 28.8 iron sat 4% iron 18, TIBC 513 and ferritin 6 vitamin B12 570 folate 7.2. Due to this he was prescribed ferrous sulfate 325 mg TID, which he has not started as he was indicated to start it after he finishes the doxycycline.   Patient has a colostomy as part of the treatment of his cancer. He had a proctectomy in 2014 and had an ostomy placed at that time.  He otherwise denies any other symptoms such as nausea, vomiting, fever, chills, hematochezia, melena, hematemesis,  abdominal distention, lightheadedness, dizziness, abdominal pain, diarrhea, jaundice, pruritus or weight loss.  Takes Xarelto for afib. Denies intake of NSAIDs, high dose aspirin, or any other antiplatelet.  Last EGD:had it >10 years ago he believes Last Colonoscopy: 2015 - Ulcerated polyp noted at colostomy with 2 smaller lesions. Larger polyp measured about 12 x 15 mm. Mucosa of cecum and rest of the colon was normal. Path: HYPERPLASTIC POLYP WITH SURFACE ULCERATION AND GRANULATION TISSUE.  FHx: neg for any gastrointestinal/liver disease, no malignancies Social: former smoking quit in 2003, neg alcohol or illicit drug use Surgical: colostomy  Past Medical History: Past Medical History:  Diagnosis Date  . Atrial fibrillation ([facility]) 12/15/2016  . BPH (benign prostatic hyperplasia)   . CAD (coronary artery disease)   . Cancer (H[facility])    RECTAL CANCER--SOME RECTAL BLEEDING  . Former tobacco use   . High cholesterol   . MI (myocardial infarction) ([place])    x 2  AT AGE 19 AND AT AGE 41  DR. [facility] IS PT'S MEDICAL DOCTOR  . Pain    RIGHT KNEE PAIN AND OCCAS OTHER JOINT PAINS  . Sleep difficulties    NEVER SLEEPS WELL - WAKES UP AFTER SEVERAL HOURS AND NOT ABLE TO GO BACK TO SLEEP--DID SLEEP STUDY AND TOLD HE DID NOT HAVE SLEEP APNEA  . Urgency-frequency syndrome    FLOMAX HAS HELPED    Past Surgical History: Past Surgical History:  Procedure Laterality Date  . CARDIOVERSION N/A 10/21/2015   Procedure: CARDIOVERSION;  Surgeon: Larey Dresser, MD;  Location: [place] [facility];  Service: Cardiovascular;  Laterality: N/A;  . COLONOSCOPY N/A  04/20/2012   Procedure: COLONOSCOPY;  Surgeon: Rogene Houston, MD;  [facility];  Service: Endoscopy;  Laterality: N/A;  225  . COLONOSCOPY N/A 06/14/2013   Procedure: COLONOSCOPY;  Surgeon: Rogene Houston, MD;  [facility];  Service: Endoscopy;  Laterality: N/A;  1245  . COLOSTOMY REVISION N/A 01/17/2013   Procedure: COLOSTOMY  REVISION;  Surgeon: Leighton Ruff, MD;  Location: [place];  Service: General;  Laterality: N/A;  . CORONARY ANGIOPLASTY WITH STENT PLACEMENT     before bypass  . CORONARY ARTERY BYPASS GRAFT     2005  . EUS N/A 05/03/2012   Procedure: LOWER ENDOSCOPIC ULTRASOUND (EUS);  Surgeon: Milus Banister, MD;  Location: [facility];  Service: Endoscopy;  Laterality: N/A;  . INSERTION OF MESH N/A 10/25/2013   Procedure: INSERTION OF STRATTICE MESH;  Surgeon: Leighton Ruff, MD;  Location: [facility];  Service: General;  Laterality: N/A;  . LAPAROSCOPIC ASSISTED ABDOMINAL PERINEAL RESECTION N/A 06/11/2012   Procedure: LAPAROSCOPIC ASSISTED ABDOMINAL PERINEAL RESECTION;  Surgeon: Leighton Ruff, MD;  Location: [facility];  Service: General;  Laterality: N/A;  . LAPAROSCOPIC PARASTOMAL HERNIA N/A 10/25/2013   Procedure: ROBOTIC PARASTOMAL HERNIA REPAIR WITH STRATTICE MESH;  Surgeon: Leighton Ruff, MD;  Location: [facility];  Service: General;  Laterality: N/A;  . PARASTOMAL HERNIA REPAIR N/A 12/14/2016   Procedure: PREPARE OF RECURRENT PERISTOMAL HERNIA WITH MESH WITH LAPAROSCOPIC LYSIS OF ADHESIONS, AND INCISIONAL HERNIA REPAIR WITH MESH;  Surgeon: Michael Boston, MD;  Location: [facility];  Service: General;  Laterality: N/A;  . TEE WITHOUT CARDIOVERSION N/A 01/17/2018   Procedure: TRANSESOPHAGEAL ECHOCARDIOGRAM (TEE);  Surgeon: Larey Dresser, MD;  Location: [facility];  Service: Cardiovascular;  Laterality: N/A;  . TONSILLECTOMY      Family History: Family History  Problem Relation Age of Onset  . Congestive Heart Failure Mother   . Stroke Father   . Coronary artery disease Father   . Coronary artery disease Brother   . Colon cancer Neg Hx     Social History: Social History   Tobacco Use  Smoking Status Former Smoker  . Packs/day: 1.00  . Years: 30.00  . Pack years: 30.00  . Types: Cigarettes  . Quit date: 01/31/2002  . Years since quitting: 18.4  Smokeless Tobacco Never Used   Social History   Substance  and Sexual Activity  Alcohol Use Yes   Comment: rarely   Social History   Substance and Sexual Activity  Drug Use No    Allergies: Allergies  Allergen Reactions  . Nsaids Other (See Comments)    No NSAIDs while patient is on Xerelto anticoagulation    Medications: Current Outpatient Medications  Medication Sig Dispense Refill  . ALPRAZolam (XANAX) 1 MG tablet Take 1 mg by mouth 4 (four) times daily as needed for anxiety or sleep.    . Ascorbic Acid (VITAMIN C PO) Take 1 tablet by mouth daily.    Marland Kitchen atorvastatin (LIPITOR) 40 MG tablet TAKE 1 TABLET BY MOUTH DAILY (Patient taking differently: Take 40 mg by mouth daily.) 30 tablet 4  . carvedilol (COREG) 3.125 MG tablet TAKE ONE TABLET BY MOUTH TWICE DAILY (Patient taking differently: Take 3.125 mg by mouth 2 (two) times daily with a meal.) 60 tablet 1  . furosemide (LASIX) 20 MG tablet Take 1 tablet (20 mg total) by mouth daily for 4 days. 4 tablet 0  . Lifitegrast 5 % SOLN Place 1 drop into both eyes 2 (two) times daily.    Marland Kitchen  potassium chloride (K-DUR) 10 MEQ tablet Take 1 tablet (10 mEq total) by mouth daily for 4 days. 4 tablet 0  . rivaroxaban (XARELTO) 20 MG TABS tablet Take 1 tablet (20 mg total) by mouth daily with supper. 30 tablet 2  . tamsulosin (FLOMAX) 0.4 MG CAPS Take 0.4 mg by mouth daily after breakfast.     . ferrous sulfate 325 (65 FE) MG tablet Take 325 mg by mouth daily with breakfast. (Patient not taking: Reported on 06/23/2020)     No current facility-administered medications for this visit.    Review of Systems: GENERAL: negative for malaise, night sweats HEENT: No changes in hearing or vision, no nose bleeds or other nasal problems. NECK: Negative for lumps, goiter, pain and significant neck swelling RESPIRATORY: Negative for cough, wheezing CARDIOVASCULAR: Negative for chest pain, leg swelling, palpitations, orthopnea GI: SEE HPI MUSCULOSKELETAL: Negative for joint pain or swelling, back pain, and muscle  pain. SKIN: Negative for lesions, rash PSYCH: Negative for sleep disturbance, mood disorder and recent psychosocial stressors. HEMATOLOGY Negative for prolonged bleeding, bruising easily, and swollen nodes. ENDOCRINE: Negative for cold or heat intolerance, polyuria, polydipsia and goiter. NEURO: negative for tremor, gait imbalance, syncope and seizures. The remainder of the review of systems is noncontributory.   Physical Exam: No exam was performed as this was a telephone encounter  Imaging/Labs: as above  I personally reviewed and interpreted the available labs, imaging and endoscopic files.  Impression and Plan: AGNES PROBERT is a 75 y.o. male with PMH atrial fibrillation, CAD s/p CABG, BPH, CAD, MI, longstanding history of iron deficiency anemia, HLD, rectal cancer  stage IIA disease status post proctectomy, who presents for evaluation of iron deficiency anemia.  The patient has not presented any evidence of clinical gastrointestinal bleeding but he has presented worsening iron deficiency anemia.  I explained to the patient that it would be important to perform an upper endoscopic investigation with a small bowel biopsies and a colonoscopy through his ostomy to rule out any lesions leading to his anemia.  In fact, he is due for surveillance of his rectal cancer as he was recommended to have a repeat colonoscopy in 3 years after his last 1.  I advised the patient to continue taking his oral iron to improve his anemia.  Patient understood and agreed.  -Schedule EGD and colonoscopy - will need to obtain clearance from Dr. Marigene Ehlers to hold Xarelto -Start oral iron supplementation after finishing doxycycline course  All questions were answered.      Total visit time: I spent a total of 35 minutes  Cody Peppers, MD Gastroenterology and Hepatology [facility]

## 2020-06-24 ENCOUNTER — Other Ambulatory Visit (INDEPENDENT_AMBULATORY_CARE_PROVIDER_SITE_OTHER): Payer: Self-pay

## 2020-06-24 ENCOUNTER — Telehealth: Payer: Self-pay | Admitting: *Deleted

## 2020-06-24 ENCOUNTER — Telehealth (INDEPENDENT_AMBULATORY_CARE_PROVIDER_SITE_OTHER): Payer: Self-pay

## 2020-06-24 ENCOUNTER — Encounter (INDEPENDENT_AMBULATORY_CARE_PROVIDER_SITE_OTHER): Payer: Self-pay

## 2020-06-24 DIAGNOSIS — D509 Iron deficiency anemia, unspecified: Secondary | ICD-10-CM

## 2020-06-24 MED ORDER — PEG 3350-KCL-NA BICARB-NACL 420 G PO SOLR
4000.0000 mL | ORAL | 0 refills | Status: DC
Start: 2020-06-24 — End: 2020-07-16

## 2020-06-24 NOTE — Telephone Encounter (Signed)
Cody Coleman, CMA  

## 2020-06-24 NOTE — Telephone Encounter (Signed)
As far as I can tell, he should be safe to hold Xarelto for 2 days for scopes.  However, I have not seen in 3 years.  He is going to need office visit at some point.

## 2020-06-24 NOTE — Telephone Encounter (Signed)
OUR OFFICE RECEIVED CLEARANCE REQUEST; THOUGH SEEMS THAT THE PT IS FOLLOWED BY DR. Aundra Dubin IN OUR [facility]. I WILL ENTER IN Epic THE CLEARANCE NOTE AND FORWARD TO DR. Aundra Dubin.     [facility] Pre-operative Risk Assessment    Patient Name: Cody Coleman  DOB: [birth year]  MRN: [id number]   HEARTCARE STAFF: - Please ensure there is not already an duplicate clearance open for this procedure. - Under Visit Info/Reason for Call, type in Other and utilize the format Clearance MM/DD/YY or Clearance TBD. Do not use dashes or single digits. - If request is for dental extraction, please clarify the # of teeth to be extracted.  Request for surgical clearance:  1. What type of surgery is being performed? COLONOSCOPY & UPPER ENDOSCOPY   2. When is this surgery scheduled? 07/03/20   3. What type of clearance is required (medical clearance vs. Pharmacy clearance to hold med vs. Both)? REQUEST ASKING FOR PHARMACY CLEARANCE ONLY  4. Are there any medications that need to be held prior to surgery and how long? XARELTO x 2 DAYS PRIOR TO PROCEDURE   5. Practice name and name of physician performing surgery? [facility]; DR. Quillian Quince CASTANEDA   6. What is the office phone number? [phone]   7.   What is the office fax number? [phone]  8.   Anesthesia type (None, local, MAC, general) ? MAC   Julaine Hua 06/24/2020, 4:41 PM  _________________________________________________________________   (provider comments below)

## 2020-06-25 ENCOUNTER — Encounter (INDEPENDENT_AMBULATORY_CARE_PROVIDER_SITE_OTHER): Payer: Self-pay

## 2020-06-25 ENCOUNTER — Other Ambulatory Visit (INDEPENDENT_AMBULATORY_CARE_PROVIDER_SITE_OTHER): Payer: Self-pay

## 2020-06-30 NOTE — Patient Instructions (Signed)
[place]  06/30/2020     @PREFPERIOPPHARMACY @   Your procedure is scheduled on  07/03/2020.   Report to [facility] at  0700 A.M.   Call this number if you have problems the morning of surgery:  [phone]   Remember:  Follow the diet and prep instructions given to you by the office.              Take these medicines the morning of surgery with A SIP OF WATER  Xanax (if needed), carvedilol, flomax.     Please brush your teeth.  Do not wear jewelry, make-up or nail polish.  Do not wear lotions, powders, or perfumes, or deodorant.  Do not shave 48 hours prior to surgery.  Men may shave face and neck.  Do not bring valuables to the hospital.  [facility] is not responsible for any belongings or valuables.  Contacts, dentures or bridgework may not be worn into surgery.  Leave your suitcase in the car.  After surgery it may be brought to your room.  For patients admitted to the hospital, discharge time will be determined by your treatment team.  Patients discharged the day of surgery will not be allowed to drive home and must have someone with them for 24 hours.    Special instructions:  DO NOT smoke tobacco or vape for 24 hours before your procedure.  Please read over the following fact sheets that you were given. Anesthesia Post-op Instructions and Care and Recovery After Surgery       Upper Endoscopy, Adult, Care After This sheet gives you information about how to care for yourself after your procedure. Your health care provider may also give you more specific instructions. If you have problems or questions, contact your health care provider. What can I expect after the procedure? After the procedure, it is common to have:  A sore throat.  Mild stomach pain or discomfort.  Bloating.  Nausea. Follow these instructions at home:  Follow instructions from your health care provider about what to eat or drink after your procedure.  Return to your  normal activities as told by your health care provider. Ask your health care provider what activities are safe for you.  Take over-the-counter and prescription medicines only as told by your health care provider.  If you were given a sedative during the procedure, it can affect you for several hours. Do not drive or operate machinery until your health care provider says that it is safe.  Keep all follow-up visits as told by your health care provider. This is important.   Contact a health care provider if you have:  A sore throat that lasts longer than one day.  Trouble swallowing. Get help right away if:  You vomit blood or your vomit looks like coffee grounds.  You have: ? A fever. ? Bloody, black, or tarry stools. ? A severe sore throat or you cannot swallow. ? Difficulty breathing. ? Severe pain in your chest or abdomen. Summary  After the procedure, it is common to have a sore throat, mild stomach discomfort, bloating, and nausea.  If you were given a sedative during the procedure, it can affect you for several hours. Do not drive or operate machinery until your health care provider says that it is safe.  Follow instructions from your health care provider about what to eat or drink after your procedure.  Return to your normal activities as told  by your health care provider. This information is not intended to replace advice given to you by your health care provider. Make sure you discuss any questions you have with your health care provider. Document Revised: 01/15/2019 Document Reviewed: 06/19/2017 Elsevier Patient Education  2021 [place].  Colonoscopy, Adult, Care After This sheet gives you information about how to care for yourself after your procedure. Your health care provider may also give you more specific instructions. If you have problems or questions, contact your health care provider. What can I expect after the procedure? After the procedure, it is common  to have:  A small amount of blood in your stool for 24 hours after the procedure.  Some gas.  Mild cramping or bloating of your abdomen. Follow these instructions at home: Eating and drinking  Drink enough fluid to keep your urine pale yellow.  Follow instructions from your health care provider about eating or drinking restrictions.  Resume your normal diet as instructed by your health care provider. Avoid heavy or fried foods that are hard to digest.   Activity  Rest as told by your health care provider.  Avoid sitting for a long time without moving. Get up to take short walks every 1-2 hours. This is important to improve blood flow and breathing. Ask for help if you feel weak or unsteady.  Return to your normal activities as told by your health care provider. Ask your health care provider what activities are safe for you. Managing cramping and bloating  Try walking around when you have cramps or feel bloated.  Apply heat to your abdomen as told by your health care provider. Use the heat source that your health care provider recommends, such as a moist heat pack or a heating pad. ? Place a towel between your skin and the heat source. ? Leave the heat on for 20-30 minutes. ? Remove the heat if your skin turns bright red. This is especially important if you are unable to feel pain, heat, or cold. You may have a greater risk of getting burned.   General instructions  If you were given a sedative during the procedure, it can affect you for several hours. Do not drive or operate machinery until your health care provider says that it is safe.  For the first 24 hours after the procedure: ? Do not sign important documents. ? Do not drink alcohol. ? Do your regular daily activities at a slower pace than normal. ? Eat soft foods that are easy to digest.  Take over-the-counter and prescription medicines only as told by your health care provider.  Keep all follow-up visits as told by  your health care provider. This is important. Contact a health care provider if:  You have blood in your stool 2-3 days after the procedure. Get help right away if you have:  More than a small spotting of blood in your stool.  Large blood clots in your stool.  Swelling of your abdomen.  Nausea or vomiting.  A fever.  Increasing pain in your abdomen that is not relieved with medicine. Summary  After the procedure, it is common to have a small amount of blood in your stool. You may also have mild cramping and bloating of your abdomen.  If you were given a sedative during the procedure, it can affect you for several hours. Do not drive or operate machinery until your health care provider says that it is safe.  Get help right away if you have  a lot of blood in your stool, nausea or vomiting, a fever, or increased pain in your abdomen. This information is not intended to replace advice given to you by your health care provider. Make sure you discuss any questions you have with your health care provider. Document Revised: 01/11/2019 Document Reviewed: 08/13/2018 Elsevier Patient Education  2021 [place] After This sheet gives you information about how to care for yourself after your procedure. Your health care provider may also give you more specific instructions. If you have problems or questions, contact your health care provider. What can I expect after the procedure? After the procedure, it is common to have:  Tiredness.  Forgetfulness about what happened after the procedure.  Impaired judgment for important decisions.  Nausea or vomiting.  Some difficulty with balance. Follow these instructions at home: For the time period you were told by your health care provider:  Rest as needed.  Do not participate in activities where you could fall or become injured.  Do not drive or use machinery.  Do not drink alcohol.  Do not take  sleeping pills or medicines that cause drowsiness.  Do not make important decisions or sign legal documents.  Do not take care of children on your own.      Eating and drinking  Follow the diet that is recommended by your health care provider.  Drink enough fluid to keep your urine pale yellow.  If you vomit: ? Drink water, juice, or soup when you can drink without vomiting. ? Make sure you have little or no nausea before eating solid foods. General instructions  Have a responsible adult stay with you for the time you are told. It is important to have someone help care for you until you are awake and alert.  Take over-the-counter and prescription medicines only as told by your health care provider.  If you have sleep apnea, surgery and certain medicines can increase your risk for breathing problems. Follow instructions from your health care provider about wearing your sleep device: ? Anytime you are sleeping, including during daytime naps. ? While taking prescription pain medicines, sleeping medicines, or medicines that make you drowsy.  Avoid smoking.  Keep all follow-up visits as told by your health care provider. This is important. Contact a health care provider if:  You keep feeling nauseous or you keep vomiting.  You feel light-headed.  You are still sleepy or having trouble with balance after 24 hours.  You develop a rash.  You have a fever.  You have redness or swelling around the IV site. Get help right away if:  You have trouble breathing.  You have new-onset confusion at home. Summary  For several hours after your procedure, you may feel tired. You may also be forgetful and have poor judgment.  Have a responsible adult stay with you for the time you are told. It is important to have someone help care for you until you are awake and alert.  Rest as told. Do not drive or operate machinery. Do not drink alcohol or take sleeping pills.  Get help right away  if you have trouble breathing, or if you suddenly become confused. This information is not intended to replace advice given to you by your health care provider. Make sure you discuss any questions you have with your health care provider. Document Revised: 10/03/2019 Document Reviewed: 12/20/2018 Elsevier Patient Education  2021 [employer].

## 2020-07-01 ENCOUNTER — Encounter (HOSPITAL_COMMUNITY)
Admission: RE | Admit: 2020-07-01 | Discharge: 2020-07-01 | Disposition: A | Discharge status: Home / Self Care | Payer: Medicare Other | Source: Ambulatory Visit | Attending: Gastroenterology | Admitting: Gastroenterology

## 2020-07-01 ENCOUNTER — Encounter (HOSPITAL_COMMUNITY): Payer: Self-pay

## 2020-07-01 ENCOUNTER — Other Ambulatory Visit: Payer: Self-pay

## 2020-07-01 HISTORY — DX: Chronic obstructive pulmonary disease, unspecified: J44.9

## 2020-07-01 HISTORY — DX: Emphysema, unspecified: J43.9

## 2020-07-01 NOTE — Progress Notes (Signed)
Patient procedure for EGD/Colonoscopy has been cancelled for 07/03/2020.  He had a chest xray that shows emphysema and had been placed on antibiotics for a chronic productive cough.  He hasn't seen cardiology in 3 years. We can reschedule his procedure after being treated with antibiotics for 2 weeks. During the waiting period, he will need to follow up with cardiology. Appointment has been made with Dr Aundra Dubin in [place] in a few weeks.

## 2020-07-03 ENCOUNTER — Ambulatory Visit (HOSPITAL_COMMUNITY): Admission: RE | Admit: 2020-07-03 | Payer: Medicare Other | Source: Home / Self Care | Admitting: Gastroenterology

## 2020-07-03 ENCOUNTER — Encounter (HOSPITAL_COMMUNITY): Admission: RE | Payer: Self-pay | Source: Home / Self Care

## 2020-07-03 SURGERY — COLONOSCOPY WITH PROPOFOL
Anesthesia: Monitor Anesthesia Care

## 2020-07-07 ENCOUNTER — Encounter (INDEPENDENT_AMBULATORY_CARE_PROVIDER_SITE_OTHER): Payer: Self-pay

## 2020-07-16 ENCOUNTER — Encounter (HOSPITAL_COMMUNITY): Payer: Self-pay

## 2020-07-16 ENCOUNTER — Other Ambulatory Visit: Payer: Self-pay

## 2020-07-16 ENCOUNTER — Ambulatory Visit (HOSPITAL_COMMUNITY)
Admission: RE | Admit: 2020-07-16 | Discharge: 2020-07-16 | Disposition: A | Discharge status: Home / Self Care | Payer: Medicare Other | Source: Ambulatory Visit | Attending: Family Medicine | Admitting: Family Medicine

## 2020-07-16 VITALS — BP 134/67 | HR 80 | Wt 180.8 lb

## 2020-07-16 DIAGNOSIS — I252 Old myocardial infarction: Secondary | ICD-10-CM | POA: Insufficient documentation

## 2020-07-16 DIAGNOSIS — Z85048 Personal history of other malignant neoplasm of rectum, rectosigmoid junction, and anus: Secondary | ICD-10-CM | POA: Diagnosis not present

## 2020-07-16 DIAGNOSIS — Z79899 Other long term (current) drug therapy: Secondary | ICD-10-CM | POA: Diagnosis not present

## 2020-07-16 DIAGNOSIS — I2510 Atherosclerotic heart disease of native coronary artery without angina pectoris: Secondary | ICD-10-CM | POA: Diagnosis not present

## 2020-07-16 DIAGNOSIS — I5022 Chronic systolic (congestive) heart failure: Secondary | ICD-10-CM

## 2020-07-16 DIAGNOSIS — Z951 Presence of aortocoronary bypass graft: Secondary | ICD-10-CM | POA: Insufficient documentation

## 2020-07-16 DIAGNOSIS — Z8249 Family history of ischemic heart disease and other diseases of the circulatory system: Secondary | ICD-10-CM | POA: Diagnosis not present

## 2020-07-16 DIAGNOSIS — E785 Hyperlipidemia, unspecified: Secondary | ICD-10-CM | POA: Diagnosis not present

## 2020-07-16 DIAGNOSIS — I4891 Unspecified atrial fibrillation: Secondary | ICD-10-CM | POA: Diagnosis not present

## 2020-07-16 DIAGNOSIS — I493 Ventricular premature depolarization: Secondary | ICD-10-CM | POA: Diagnosis not present

## 2020-07-16 DIAGNOSIS — Z9049 Acquired absence of other specified parts of digestive tract: Secondary | ICD-10-CM | POA: Diagnosis not present

## 2020-07-16 DIAGNOSIS — Z7901 Long term (current) use of anticoagulants: Secondary | ICD-10-CM | POA: Diagnosis not present

## 2020-07-16 DIAGNOSIS — I255 Ischemic cardiomyopathy: Secondary | ICD-10-CM | POA: Insufficient documentation

## 2020-07-16 DIAGNOSIS — I5023 Acute on chronic systolic (congestive) heart failure: Secondary | ICD-10-CM | POA: Diagnosis not present

## 2020-07-16 DIAGNOSIS — G4730 Sleep apnea, unspecified: Secondary | ICD-10-CM

## 2020-07-16 LAB — CBC
HCT: 30.9 % — ABNORMAL LOW (ref 39.0–52.0)
Hemoglobin: 8.4 g/dL — ABNORMAL LOW (ref 13.0–17.0)
MCH: 21.1 pg — ABNORMAL LOW (ref 26.0–34.0)
MCHC: 27.2 g/dL — ABNORMAL LOW (ref 30.0–36.0)
MCV: 77.4 fL — ABNORMAL LOW (ref 80.0–100.0)
Platelets: 174 10*3/uL (ref 150–400)
RBC: 3.99 MIL/uL — ABNORMAL LOW (ref 4.22–5.81)
RDW: 28.5 % — ABNORMAL HIGH (ref 11.5–15.5)
WBC: 4.9 10*3/uL (ref 4.0–10.5)
nRBC: 0 % (ref 0.0–0.2)

## 2020-07-16 LAB — BASIC METABOLIC PANEL
Anion gap: 8 (ref 5–15)
BUN: 15 mg/dL (ref 8–23)
CO2: 28 mmol/L (ref 22–32)
Calcium: 8.6 mg/dL — ABNORMAL LOW (ref 8.9–10.3)
Chloride: 104 mmol/L (ref 98–111)
Creatinine, Ser: 0.8 mg/dL (ref 0.61–1.24)
GFR, Estimated: >60 mL/min (ref >60)
Glucose, Bld: 100 mg/dL — ABNORMAL HIGH (ref 70–99)
Potassium: 4.1 mmol/L (ref 3.5–5.1)
Sodium: 140 mmol/L (ref 135–145)

## 2020-07-16 LAB — LIPID PANEL
Cholesterol: 96 mg/dL (ref 0–200)
HDL: 48 mg/dL (ref >40)
LDL Cholesterol: 43 mg/dL (ref 0–99)
Total CHOL/HDL Ratio: 2 RATIO
Triglycerides: 27 mg/dL (ref <150)
VLDL: 5 mg/dL (ref 0–40)

## 2020-07-16 LAB — BRAIN NATRIURETIC PEPTIDE: B Natriuretic Peptide: 460.5 pg/mL — ABNORMAL HIGH (ref 0.0–100.0)

## 2020-07-16 MED ORDER — LOSARTAN POTASSIUM 25 MG PO TABS: 12.5000 mg | ORAL_TABLET | Freq: Every day | ORAL | 3 refills | Status: DC

## 2020-07-16 MED ORDER — FUROSEMIDE 40 MG PO TABS: 40.0000 mg | ORAL_TABLET | Freq: Two times a day (BID) | ORAL | 6 refills | Status: DC

## 2020-07-16 NOTE — Patient Instructions (Signed)
INCREASE Lasix to 40 mg, twice a day START Losartan 12.6 mg, (one half tab) daily at bedtime  Labs today We will only contact you if something comes back abnormal or we need to make some changes. Otherwise no news is good news!  Labs needed in 7-10 days  Your physician recommends that you schedule a follow-up appointment in: 2 weeks  in the Advanced Practitioners (PA/NP) Clinic, in 6 weeks with the pharmacy team, and in 3 months with Dr Aundra Dubin and echo  Your physician has requested that you have an echocardiogram. Echocardiography is a painless test that uses sound waves to create images of your heart. It provides your doctor with information about the size and shape of your heart and how well your heart's chambers and valves are working. This procedure takes approximately one hour. There are no restrictions for this procedure.  Do the following things EVERYDAY: Weigh yourself in the morning before breakfast. Write it down and keep it in a log. Take your medicines as prescribed Eat low salt foods--Limit salt (sodium) to 2000 mg per day.  Stay as active as you can everyday Limit all fluids for the day to less than 2 liters  At the [facility], you and your health needs are our priority. As part of our continuing mission to provide you with exceptional heart care, we have created designated Provider Care Teams. These Care Teams include your primary Cardiologist (physician) and Advanced Practice Providers (APPs- Physician Assistants and Nurse Practitioners) who all work together to provide you with the care you need, when you need it.   You may see any of the following providers on your designated Care Team at your next follow up: Dr Glori Bickers Dr Loralie Champagne Dr Patrice Paradise, NP Lyda Jester, [place] Ginnie Smart Audry Riles, PharmD   Please be sure to bring in all your medications bottles to every appointment.   If you have any questions or  concerns before your next appointment please send [place] a message through [place] or call our office at [phone].    TO LEAVE A MESSAGE FOR THE NURSE SELECT OPTION 2, PLEASE LEAVE A MESSAGE INCLUDING: YOUR NAME DATE OF BIRTH CALL BACK NUMBER REASON FOR CALL**this is important as we prioritize the call backs  YOU WILL RECEIVE A CALL BACK THE SAME DAY AS LONG AS YOU CALL BEFORE 4:00 PM

## 2020-07-16 NOTE — Progress Notes (Addendum)
PCP: Dr. Gerarda Fraction Cardiology: Dr. Aundra Dubin   75 y.o. with history of CAD s/p CABG and rectal cancer s/p abdominal perineal resection in 5/14 presents for cardiology followup.  He had CABG x 6 in 2005.  Stress tests later in 2005 and in 2008 were normal.  As above, he had APR in 5/14.  He had a complicated colostomy revision in 12/14.  After this operation, he developed abdominal wall cellulitis and septic shock, also in 12/14.  He was positive for influenza also.  His colostomy will be permanent. He had moved to [facility] but has returned to live in [place].    At a prior appointment, he was noted to be in atrial fibrillation with rate control.  He did not realize that he was in atrial fibrillation.  Holter monitor showed persistent AF.  He looked volume overloaded on exam and Lasix was started.  Echo in 8/17 showed EF 45-50%, diffuse hypokinesis.  He underwent DCCV 9/17 but did not hold NSR.  Lexiscan Cardiolite in 10/17 showed EF 40%, lateral fixed defect, no ischemia.  Echo in 8/18 showed EF 40% with wall motion abnormalities.    Today he returns for HF follow up with his wife, last seen in clinic 11/19. Feels his fluid is up, started with productive cough, unresponsive to steroids/abx by PCP, sent to Pulm who treated with doxy + prednisone. LE swelling started last couple of months. Having SOB with yard work & carrying groceries, ok with on treadmill. Denies CP, dizziness, or PND/Orthopnea. Stress eating salty foods, likes pickles. No fever or chills. Weight at home up 10 lbs, ~175 pounds. Taking all medications. Checks BP at home ~ 100s/60.  ECG (personally reviewed): Atrial fibrillation 84 bpm with occasional PVCs, qtc 482 ms.   Labs (3/14): LDL 83, HDL 64, TGs 203 Labs (4/14): K 3.4, creatinine 0.77 Labs (6/14): LDL 63, HDL 41 Labs (5/15): creatinine 0.78 Labs (8/17): LDL 63, HDL 52 Labs (9/17): TSH normal, K 4, creatinine 0.87 Labs (10/17): K 4.2, creatinine 0.77, BNP 108.5 Labs (12/17): HCT  41.6, LDL 68, HDL 50 Labs (6/18): K 3.9, creatinine 1.06 Labs (9/19): hgb 12.1   PMH: 1. Rectal cancer: Abdominal perineal resection in 5/14, has permanent colostomy.  2. CAD: CABG in 2005 with LIMA-LAD, SVG-D, seq SVG-OM1/dLCx, seq SVG-PDA/PLV.  Stress myoviews in 11/05 and in 2008 were normal. 3. Hyperlipidemia 4. Atrial fibrillation: First noted 8/17.  Now chronic.  - DCCV 9/17: Converted to NSR but went back into atrial fibrillation before leaving hospital.  5. Chronic systolic CHF: Ischemic cardiomyopathy.  - Echo (8/17) with EF 45-50%, mild LVH, mildly dilated LV.   - Lexiscan Cardiolite (10/17): EF 40%, no ischemia, lateral fixed defect.  - Echo (8/18): EF 40%, anterolateral/inferolateral HK, basal to mid inferior AK, normal RV size with mildly decreased systolic function.  6. Bradycardia   SH: Lives in [place] with wife, quit smoking 2004, continues moderate ETOH intake, retired from Research officer, trade union and Production manager (Engineer, production).     FH: CAD (brother), CVA (father), CHF (mother).    ROS: All systems were reviewed and negative except as per HPI.   Current Outpatient Medications  Medication Sig Dispense Refill   albuterol (PROVENTIL) (2.5 MG/3ML) 0.083% nebulizer solution Take 2.5 mg by nebulization every 4 (four) hours as needed for wheezing or shortness of breath.     ALPRAZolam (XANAX) 1 MG tablet Take 1 mg by mouth 4 (four) times daily as needed for anxiety or sleep.     Ascorbic Acid (VITAMIN  C PO) Take 1,000 mg by mouth daily.     atorvastatin (LIPITOR) 40 MG tablet TAKE 1 TABLET BY MOUTH DAILY (Patient taking differently: Take 40 mg by mouth daily.) 30 tablet 4   carvedilol (COREG) 3.125 MG tablet TAKE ONE TABLET BY MOUTH TWICE DAILY (Patient taking differently: Take 3.125 mg by mouth 2 (two) times daily with a meal.) 60 tablet 1   ferrous sulfate 325 (65 FE) MG EC tablet Take 325 mg by mouth 3 (three) times daily with meals.     furosemide (LASIX) 40 MG tablet Take 40 mg by mouth  daily.     Lifitegrast (XIIDRA) 5 % SOLN Apply 1 drop to eye in the morning and at bedtime.     potassium chloride SA (KLOR-CON) 20 MEQ tablet Take 20 mEq by mouth daily.     rivaroxaban (XARELTO) 20 MG TABS tablet Take 1 tablet (20 mg total) by mouth daily with supper. 30 tablet 2   tamsulosin (FLOMAX) 0.4 MG CAPS Take 0.4 mg by mouth daily after breakfast.      doxycycline (VIBRAMYCIN) 100 MG capsule Take 100 mg by mouth 2 (two) times daily.     No current facility-administered medications for this encounter.    BP 134/67   Pulse 80   Wt 82 kg (180 lb 12.8 oz)   SpO2 96%   BMI 28.32 kg/m   Wt Readings from Last 3 Encounters:  07/16/20 82 kg (180 lb 12.8 oz)  07/01/20 79.4 kg (175 lb)  06/23/20 78.9 kg (174 lb)   General:  NAD. No resp difficulty HEENT: Normal Neck: Supple. JVP ~10. Carotids 2+ bilat; no bruits. No lymphadenopathy or thryomegaly appreciated. Cor: PMI nondisplaced. Irregular rate & rhythm. No rubs, gallops or murmurs. Lungs: Clear, faint crackles RLL. Abdomen: Soft, nontender, +distended. No hepatosplenomegaly. No bruits or masses. Good bowel sounds, LLQ colostomy. Extremities: No cyanosis, clubbing, rash, 2+ LE edema, L>R Neuro: alert & oriented x 3, cranial nerves grossly intact. Moves all 4 extremities w/o difficulty. Affect pleasant.  Assessment/Plan: 1. CAD: S/p CABG.  No chest pain or dyspnea.  Cardiolite in 10/17 with prior lateral MI but no ischemia. No CP. - No ASA given stable CAD and Xarelto use.  - Continue statin.  2. Hyperlipidemia: On atorvastatin. Check lipids today. 3. Acute on chronic systolic CHF: Echo in 3/82 showed EF 40% with wall motion abnormalities.  He is volume overloaded on exam today, NYHA class II-early III symptoms.  - Increase lasix to 40 mg bid x 1 week, then back to 40 mg daily.  - Continue Coreg 3.125 mg bid.   - Start losartan 12.5 mg qhs (not sure BP will be able to handle Entresto as his baseline at home is 100s) - BMET &  BNP today; repeat BMET 10 days.  - Needs echo. - Counseled on need for daily weights, fluid restriction to <2L/day and <2 g salt intake/daily. 4. Atrial fibrillation:  He does not feel the atrial fibrillation, not sure how long it has been present.  He failed cardioversion in 9/17. He is not a good candidate for antiarrhythmics given baseline long QT and history of CAD.   DCCV x 2 unsuccessful. - Continue low dose Coreg.  - Continue Xarelto. Check CBC today. 5. Suspect sleep apnea: Encouraged him in the past to get a sleep study but he is not interested. 6. H/o rectal cancer: s/p LLQ colectomy (5/14).  7. Surgical Clearance: scheduled for repeat colonoscopy. Would like to repeat echo  and optimize him from a HF standpoint before colonoscopy.  -Follow up with APP in 2 weeks to re-assess volume status. Needs optimization with GDMT and will repeat echo.  Anticipate adding SGLT2i at next visit.   -Hold off on colonoscopy until better optimized from fluid standpoint.   [place], FNP-BC 07/16/2020

## 2020-07-21 ENCOUNTER — Encounter (HOSPITAL_COMMUNITY): Payer: Self-pay

## 2020-07-23 ENCOUNTER — Other Ambulatory Visit: Payer: Self-pay

## 2020-07-23 ENCOUNTER — Ambulatory Visit (HOSPITAL_COMMUNITY)
Admission: RE | Admit: 2020-07-23 | Discharge: 2020-07-23 | Disposition: A | Discharge status: Home / Self Care | Payer: Medicare Other | Source: Ambulatory Visit | Attending: Internal Medicine | Admitting: Internal Medicine

## 2020-07-23 DIAGNOSIS — I5022 Chronic systolic (congestive) heart failure: Secondary | ICD-10-CM | POA: Insufficient documentation

## 2020-07-23 LAB — BASIC METABOLIC PANEL
Anion gap: 7 (ref 5–15)
BUN: 22 mg/dL (ref 8–23)
CO2: 32 mmol/L (ref 22–32)
Calcium: 9.1 mg/dL (ref 8.9–10.3)
Chloride: 100 mmol/L (ref 98–111)
Creatinine, Ser: 1.18 mg/dL (ref 0.61–1.24)
GFR, Estimated: >60 mL/min (ref >60)
Glucose, Bld: 114 mg/dL — ABNORMAL HIGH (ref 70–99)
Potassium: 4 mmol/L (ref 3.5–5.1)
Sodium: 139 mmol/L (ref 135–145)

## 2020-08-05 NOTE — Progress Notes (Signed)
PCP: Dr. Gerarda Fraction Cardiology: Dr. Aundra Dubin   75 y.o. with history of CAD s/p CABG and rectal cancer s/p abdominal perineal resection in 5/14 presents for cardiology followup.  He had CABG x 6 in 2005.  Stress tests later in 2005 and in 2008 were normal.  As above, he had APR in 5/14.  He had a complicated colostomy revision in 12/14.  After this operation, he developed abdominal wall cellulitis and septic shock, also in 12/14.  He was positive for influenza also.  His colostomy will be permanent. He had moved to [facility] but has returned to live in [place].    At a prior appointment, he was noted to be in atrial fibrillation with rate control.  He did not realize that he was in atrial fibrillation.  Holter monitor showed persistent AF.  He looked volume overloaded on exam and Lasix was started.  Echo in 8/17 showed EF 45-50%, diffuse hypokinesis.  He underwent DCCV 9/17 but did not hold NSR.  Lexiscan Cardiolite in 10/17 showed EF 40%, lateral fixed defect, no ischemia.  Echo in 8/18 showed EF 40% with wall motion abnormalities.    Returned for HF follow up with his wife 6/22, last seen in clinic 11/19. Feels his fluid is up, started with productive cough, unresponsive to steroids/abx by PCP, sent to Pulm who treated with doxy + prednisone. LE swelling started last couple of months. Having SOB with yard work & carrying groceries, ok with on treadmill. Denies CP, dizziness, or PND/Orthopnea. Stress eating salty foods, likes pickles. No fever or chills. Weight at home up 10 lbs, ~175 pounds. BP at home ~ 100s/60. Diuretics increased and losartan started.  Today he returns for HF follow up. Overall feeling fine, struggling with coughing and sinus drainage issues. Denies increasing SOB, CP, dizziness, edema, or PND/Orthopnea. Appetite ok. No fever or chills. Weight at home 167 pounds. Taking all medications. Significantly changed diet.   Labs (3/14): LDL 83, HDL 64, TGs 203 Labs (4/14): K 3.4, creatinine  0.77 Labs (6/14): LDL 63, HDL 41 Labs (5/15): creatinine 0.78 Labs (8/17): LDL 63, HDL 52 Labs (9/17): TSH normal, K 4, creatinine 0.87 Labs (10/17): K 4.2, creatinine 0.77, BNP 108.5 Labs (12/17): HCT 41.6, LDL 68, HDL 50 Labs (6/18): K 3.9, creatinine 1.06 Labs (9/19): hgb 12.1 Labs (6/22): K 4.0, creatinine 1.18   PMH: 1. Rectal cancer: Abdominal perineal resection in 5/14, has permanent colostomy.  2. CAD: CABG in 2005 with LIMA-LAD, SVG-D, seq SVG-OM1/dLCx, seq SVG-PDA/PLV.  Stress myoviews in 11/05 and in 2008 were normal. 3. Hyperlipidemia 4. Atrial fibrillation: First noted 8/17.  Now chronic.  - DCCV 9/17: Converted to NSR but went back into atrial fibrillation before leaving hospital.  5. Chronic systolic CHF: Ischemic cardiomyopathy.  - Echo (8/17) with EF 45-50%, mild LVH, mildly dilated LV.   - Lexiscan Cardiolite (10/17): EF 40%, no ischemia, lateral fixed defect.  - Echo (8/18): EF 40%, anterolateral/inferolateral HK, basal to mid inferior AK, normal RV size with mildly decreased systolic function.  6. Bradycardia   SH: Lives in [place] with wife, quit smoking 2004, continues moderate ETOH intake, retired from Research officer, trade union and Production manager (Engineer, production).     FH: CAD (brother), CVA (father), CHF (mother).    ROS: All systems were reviewed and negative except as per HPI.   Current Outpatient Medications  Medication Sig Dispense Refill   albuterol (PROVENTIL) (2.5 MG/3ML) 0.083% nebulizer solution Take 2.5 mg by nebulization every 4 (four) hours as needed for wheezing  or shortness of breath.     ALPRAZolam (XANAX) 1 MG tablet Take 1 mg by mouth 4 (four) times daily as needed for anxiety or sleep.     Ascorbic Acid (VITAMIN C PO) Take 1,000 mg by mouth daily.     atorvastatin (LIPITOR) 40 MG tablet TAKE 1 TABLET BY MOUTH DAILY 30 tablet 4   carvedilol (COREG) 3.125 MG tablet TAKE ONE TABLET BY MOUTH TWICE DAILY 60 tablet 1   ferrous sulfate 325 (65 FE) MG EC tablet Take 325 mg  by mouth 3 (three) times daily with meals.     furosemide (LASIX) 40 MG tablet Take 40 mg by mouth daily.     Lifitegrast (XIIDRA) 5 % SOLN Apply 1 drop to eye in the morning and at bedtime.     losartan (COZAAR) 25 MG tablet Take 0.5 tablets (12.5 mg total) by mouth daily. 45 tablet 3   potassium chloride SA (KLOR-CON) 20 MEQ tablet Take 20 mEq by mouth daily.     rivaroxaban (XARELTO) 20 MG TABS tablet Take 1 tablet (20 mg total) by mouth daily with supper. 30 tablet 2   tamsulosin (FLOMAX) 0.4 MG CAPS Take 0.4 mg by mouth daily after breakfast.      No current facility-administered medications for this encounter.   BP 112/60   Pulse 70   Wt 76.7 kg (169 lb)   SpO2 99%   BMI 26.47 kg/m   Wt Readings from Last 3 Encounters:  08/06/20 76.7 kg (169 lb)  07/16/20 82 kg (180 lb 12.8 oz)  07/01/20 79.4 kg (175 lb)   Physical Exam: General:  NAD. No resp difficulty HEENT: Normal Neck: Supple. No JVD. Carotids 2+ bilat; no bruits. No lymphadenopathy or thryomegaly appreciated. Cor: PMI nondisplaced. Irregular rate & rhythm. No rubs, gallops or murmurs. Lungs: Clear Abdomen: Soft, nontender, nondistended. No hepatosplenomegaly. No bruits or masses. Good bowel sounds. + LLQ colostomy Extremities: No cyanosis, clubbing, rash, edema Neuro: Alert & oriented x 3, cranial nerves grossly intact. Moves all 4 extremities w/o difficulty. Affect pleasant.  Assessment/Plan: 1. CAD: S/p CABG.  No chest pain or dyspnea.  Cardiolite in 10/17 with prior lateral MI but no ischemia. No CP. - No ASA given stable CAD and Xarelto use.  - Continue statin.  2. Hyperlipidemia: On atorvastatin. Check lipids today. 3. Chronic systolic CHF: Echo in 4/09 showed EF 40% with wall motion abnormalities.  He is volume overloaded on exam today, NYHA class II-early III symptoms.  - Start Farxiga 10 mg daily. - Continue lasix to 40 mg daily. May be able to scale back at next visit. - Continue carvedilol 3.125 mg bid.    - Continue losartan 12.5 mg qhs (not sure BP will be able to handle Entresto as his baseline at home is 100s) - BMET today, repeat 10 days. - Needs echo. - Counseled on need for daily weights, fluid restriction to <2L/day and <2 g salt intake/daily. 4. Atrial fibrillation:  He does not feel the atrial fibrillation, not sure how long it has been present.  He failed cardioversion in 9/17. He is not a good candidate for antiarrhythmics given baseline long QT and history of CAD.  DCCV x 2 unsuccessful. - Continue low dose Coreg.  - Continue Xarelto. No bleeding. 5. Suspect sleep apnea: Encouraged him in the past to get a sleep study but he is not interested. 6. H/o rectal cancer: s/p LLQ colectomy (5/14).   F/u in 3-4 weeks with PharmD for further  medication titration.  Anticipate adding spiro +/- changing lasix to prn at next visit. Echo has been ordered.  [place], FNP-BC 08/06/2020

## 2020-08-06 ENCOUNTER — Encounter (HOSPITAL_COMMUNITY): Payer: Self-pay

## 2020-08-06 ENCOUNTER — Ambulatory Visit (HOSPITAL_COMMUNITY)
Admission: RE | Admit: 2020-08-06 | Discharge: 2020-08-06 | Disposition: A | Discharge status: Home / Self Care | Payer: Medicare Other | Source: Ambulatory Visit | Attending: Family Medicine | Admitting: Family Medicine

## 2020-08-06 ENCOUNTER — Other Ambulatory Visit: Payer: Self-pay

## 2020-08-06 VITALS — BP 112/60 | HR 70 | Wt 169.0 lb

## 2020-08-06 DIAGNOSIS — G4730 Sleep apnea, unspecified: Secondary | ICD-10-CM

## 2020-08-06 DIAGNOSIS — Z933 Colostomy status: Secondary | ICD-10-CM | POA: Insufficient documentation

## 2020-08-06 DIAGNOSIS — Z85048 Personal history of other malignant neoplasm of rectum, rectosigmoid junction, and anus: Secondary | ICD-10-CM | POA: Diagnosis not present

## 2020-08-06 DIAGNOSIS — Z951 Presence of aortocoronary bypass graft: Secondary | ICD-10-CM | POA: Insufficient documentation

## 2020-08-06 DIAGNOSIS — Z8249 Family history of ischemic heart disease and other diseases of the circulatory system: Secondary | ICD-10-CM | POA: Insufficient documentation

## 2020-08-06 DIAGNOSIS — Z7901 Long term (current) use of anticoagulants: Secondary | ICD-10-CM | POA: Diagnosis not present

## 2020-08-06 DIAGNOSIS — I4891 Unspecified atrial fibrillation: Secondary | ICD-10-CM

## 2020-08-06 DIAGNOSIS — Z79899 Other long term (current) drug therapy: Secondary | ICD-10-CM | POA: Diagnosis not present

## 2020-08-06 DIAGNOSIS — E785 Hyperlipidemia, unspecified: Secondary | ICD-10-CM

## 2020-08-06 DIAGNOSIS — I5022 Chronic systolic (congestive) heart failure: Secondary | ICD-10-CM | POA: Diagnosis present

## 2020-08-06 DIAGNOSIS — I2510 Atherosclerotic heart disease of native coronary artery without angina pectoris: Secondary | ICD-10-CM

## 2020-08-06 LAB — BASIC METABOLIC PANEL
Anion gap: 7 (ref 5–15)
BUN: 13 mg/dL (ref 8–23)
CO2: 31 mmol/L (ref 22–32)
Calcium: 8.9 mg/dL (ref 8.9–10.3)
Chloride: 100 mmol/L (ref 98–111)
Creatinine, Ser: 0.92 mg/dL (ref 0.61–1.24)
GFR, Estimated: >60 mL/min (ref >60)
Glucose, Bld: 93 mg/dL (ref 70–99)
Potassium: 3.9 mmol/L (ref 3.5–5.1)
Sodium: 138 mmol/L (ref 135–145)

## 2020-08-06 MED ORDER — DAPAGLIFLOZIN PROPANEDIOL 10 MG PO TABS
10.0000 mg | ORAL_TABLET | Freq: Every day | ORAL | 3 refills | Status: DC
Start: 2020-08-06 — End: 2020-08-20

## 2020-08-06 NOTE — Patient Instructions (Signed)
START Farxiga 10mg  daily  Routine lab work today. Will notify you of abnormal results  Repeat labs in 10 days  Echo 7/28 at 9:00am  Do the following things EVERYDAY: Weigh yourself in the morning before breakfast. Write it down and keep it in a log. Take your medicines as prescribed Eat low salt foods--Limit salt (sodium) to 2000 mg per day.  Stay as active as you can everyday Limit all fluids for the day to less than 2 liters

## 2020-08-17 ENCOUNTER — Other Ambulatory Visit (HOSPITAL_COMMUNITY): Payer: Self-pay | Admitting: *Deleted

## 2020-08-17 ENCOUNTER — Ambulatory Visit (HOSPITAL_COMMUNITY)
Admission: RE | Admit: 2020-08-17 | Discharge: 2020-08-17 | Disposition: A | Discharge status: Home / Self Care | Payer: Medicare Other | Source: Ambulatory Visit | Attending: Internal Medicine | Admitting: Internal Medicine

## 2020-08-17 ENCOUNTER — Other Ambulatory Visit: Payer: Self-pay

## 2020-08-17 DIAGNOSIS — I5022 Chronic systolic (congestive) heart failure: Secondary | ICD-10-CM

## 2020-08-17 LAB — BASIC METABOLIC PANEL
Anion gap: 8 (ref 5–15)
BUN: 20 mg/dL (ref 8–23)
CO2: 31 mmol/L (ref 22–32)
Calcium: 9 mg/dL (ref 8.9–10.3)
Chloride: 100 mmol/L (ref 98–111)
Creatinine, Ser: 0.94 mg/dL (ref 0.61–1.24)
GFR, Estimated: >60 mL/min (ref >60)
Glucose, Bld: 95 mg/dL (ref 70–99)
Potassium: 4.2 mmol/L (ref 3.5–5.1)
Sodium: 139 mmol/L (ref 135–145)

## 2020-08-18 ENCOUNTER — Other Ambulatory Visit (HOSPITAL_COMMUNITY): Payer: Self-pay

## 2020-08-20 ENCOUNTER — Other Ambulatory Visit (HOSPITAL_COMMUNITY): Payer: Self-pay | Admitting: *Deleted

## 2020-08-20 ENCOUNTER — Telehealth (HOSPITAL_COMMUNITY): Payer: Self-pay | Admitting: Pharmacy Technician

## 2020-08-20 ENCOUNTER — Other Ambulatory Visit (HOSPITAL_COMMUNITY): Payer: Self-pay

## 2020-08-20 MED ORDER — EMPAGLIFLOZIN 10 MG PO TABS: 10.0000 mg | ORAL_TABLET | Freq: Every day | ORAL | 3 refills | Status: DC

## 2020-08-20 NOTE — Telephone Encounter (Signed)
Advanced Heart Failure Patient Advocate Encounter  Patient was seen recently and started on Farxiga. Upon investigation, patient's insurance prefers [place]. Current 90 day co-pay, $0.   Called and left patient a detailed message about switching from [place] to [place]. Sent Jasmine, ([place]) a request to send Jardiance 90 day RX to patient's pharmacy.  Charlann Boxer, CPhT

## 2020-08-27 ENCOUNTER — Encounter (HOSPITAL_COMMUNITY): Payer: Self-pay

## 2020-08-27 ENCOUNTER — Other Ambulatory Visit: Payer: Self-pay

## 2020-08-27 ENCOUNTER — Ambulatory Visit (HOSPITAL_COMMUNITY)
Admission: RE | Admit: 2020-08-27 | Discharge: 2020-08-27 | Disposition: A | Discharge status: Home / Self Care | Payer: Medicare Other | Source: Ambulatory Visit | Attending: Cardiology | Admitting: Cardiology

## 2020-08-27 ENCOUNTER — Ambulatory Visit (HOSPITAL_BASED_OUTPATIENT_CLINIC_OR_DEPARTMENT_OTHER)
Admission: RE | Admit: 2020-08-27 | Discharge: 2020-08-27 | Disposition: A | Discharge status: Home / Self Care | Payer: Medicare Other | Source: Ambulatory Visit | Attending: Family Medicine | Admitting: Family Medicine

## 2020-08-27 VITALS — BP 112/62 | HR 56 | Wt 171.0 lb

## 2020-08-27 DIAGNOSIS — I2510 Atherosclerotic heart disease of native coronary artery without angina pectoris: Secondary | ICD-10-CM | POA: Insufficient documentation

## 2020-08-27 DIAGNOSIS — E785 Hyperlipidemia, unspecified: Secondary | ICD-10-CM | POA: Diagnosis not present

## 2020-08-27 DIAGNOSIS — I5022 Chronic systolic (congestive) heart failure: Secondary | ICD-10-CM | POA: Insufficient documentation

## 2020-08-27 DIAGNOSIS — I252 Old myocardial infarction: Secondary | ICD-10-CM | POA: Diagnosis not present

## 2020-08-27 LAB — BASIC METABOLIC PANEL
Anion gap: 6 (ref 5–15)
BUN: 14 mg/dL (ref 8–23)
CO2: 30 mmol/L (ref 22–32)
Calcium: 8.9 mg/dL (ref 8.9–10.3)
Chloride: 100 mmol/L (ref 98–111)
Creatinine, Ser: 0.82 mg/dL (ref 0.61–1.24)
GFR, Estimated: >60 mL/min (ref >60)
Glucose, Bld: 96 mg/dL (ref 70–99)
Potassium: 4.2 mmol/L (ref 3.5–5.1)
Sodium: 136 mmol/L (ref 135–145)

## 2020-08-27 LAB — ECHOCARDIOGRAM COMPLETE
Area-P 1/2: 4.6 cm2
Calc EF: 50.3 %
S' Lateral: 4.3 cm
Single Plane A2C EF: 46.5 %
Single Plane A4C EF: 46.4 %

## 2020-08-27 MED ORDER — FUROSEMIDE 40 MG PO TABS: 40.0000 mg | ORAL_TABLET | Freq: Every day | ORAL | 3 refills | Status: DC | PRN

## 2020-08-27 MED ORDER — POTASSIUM CHLORIDE CRYS ER 20 MEQ PO TBCR: 20.0000 meq | EXTENDED_RELEASE_TABLET | Freq: Every day | ORAL | 3 refills | Status: DC | PRN

## 2020-08-27 MED ORDER — SPIRONOLACTONE 25 MG PO TABS: 12.5000 mg | ORAL_TABLET | Freq: Every day | ORAL | 3 refills | Status: DC

## 2020-08-27 NOTE — Progress Notes (Signed)
  Echocardiogram 2D Echocardiogram has been performed.  Cody Coleman M 08/27/2020, 9:40 AM

## 2020-08-27 NOTE — Patient Instructions (Signed)
It was a pleasure seeing you today!  MEDICATIONS: -We are changing your medications today -Start spironolactone 1/2 tablet (12.5 mg) ONCE daily -Reduce your lasix to 1 tablet (40 mg) daily AS NEEDED -Call if you have questions about your medications.  LABS: -We will call you if your labs need attention.  NEXT APPOINTMENT: Return to clinic in 1 week with lab and in 1 month with pharmacist.  In general, to take care of your heart failure: -Limit your fluid intake to 2 Liters (half-gallon) per day.   -Limit your salt intake to ideally 2-3 grams (2000-3000 mg) per day. -Weigh yourself daily and record, and bring that "weight diary" to your next appointment.  (Weight gain of 2-3 pounds in 1 day typically means fluid weight.) -The medications for your heart are to help your heart and help you live longer.   -Please contact us before stopping any of your heart medications.  Call the clinic at [phone] with questions or to reschedule future appointments.

## 2020-08-27 NOTE — Progress Notes (Signed)
PCP: Dr. Gerarda Fraction Cardiology: Dr. Aundra Dubin  HPI:  75 y.o. with history of CAD s/p CABG and rectal cancer s/p abdominal perineal resection in 5/14 presents for cardiology followup.  He had CABG x 6 in 2005.  Stress tests later in 2005 and in 2008 were normal.  As above, he had APR in 5/14.  He had a complicated colostomy revision in 12/14.  After this operation, he developed abdominal wall cellulitis and septic shock, also in 12/14.  He was positive for influenza also.  His colostomy will be permanent. He had moved to [facility] but has returned to live in [place].   At a prior appointment, he was noted to be in atrial fibrillation with rate control.  He did not realize that he was in atrial fibrillation.  Holter monitor showed persistent AF.  He looked volume overloaded on exam and Lasix was started.  Echo in 8/17 showed EF 45-50%, diffuse hypokinesis.  He underwent DCCV 9/17 but did not hold NSR.  Lexiscan Cardiolite in 10/17 showed EF 40%, lateral fixed defect, no ischemia.  Echo in 8/18 showed EF 40% with wall motion abnormalities.     Returned for HF follow up with his wife 6/22. Felt his fluid was up, started with productive cough, unresponsive to steroids/abx by PCP, sent to Pulm who treated with doxy + prednisone. LE swelling started last couple of months. Having SOB with yard work & carrying groceries, ok with on treadmill. Denied CP, dizziness, or PND/Orthopnea. Stress eating salty foods, likes pickles. No fever or chills. Weight at home was up 10 lbs. BP at home ~ 100s/60. Diuretics increased and losartan started.   On 08/06/20, he returned for HF follow up. Overall felt fine, struggled with coughing and sinus drainage issues. Denied increasing SOB, CP, dizziness, edema, or PND/Orthopnea. Appetite ok. No fever or chills. Weight at home down to 167 pounds. Taking all medications. Significantly changed diet.   Today, he returns to HF clinic with his wife for pharmacist medication titration. At last  visit with FNP on 08/06/20, Jardiance 10 mg daily was started (preferred over [place] on insurance). Overall, he is feeling good. Denies any SOB, dizziness, lightheadedness, fatigue, chest pain, or palpitations. No PND, LEE, or orthopnea. Weight at home stable. His exercise has reduced over the last couple months with it being too hot out to go for walks. Good appetite. ECHO done today showed stable EF 50-55%.  HF Medications: Carvedilol 3.125 mg BID Losartan 12.5 mg daily Jardiance 10 mg daily  Furosemide 40 mg daily KCl 20 mEq daily  Has the patient been experiencing any side effects to the medications prescribed? No   Does the patient have any problems obtaining medications due to transportation or finances? No - has [facility] Medicare  Understanding of regimen: excellent Understanding of indications: excellent Potential of compliance: excellent Patient understands to avoid NSAIDs. Patient understands to avoid decongestants.    Pertinent Lab Values: Serum creatinine 0.82, BUN 14, Potassium 4.2, Sodium 136  Vital Signs: Weight: 171 lbs (last clinic weight: 169 lbs) Blood pressure: 112/60 mmHg Heart rate: 70 bpm  Assessment/Plan: 1. CAD: S/p CABG.  No chest pain or dyspnea.  Cardiolite in 10/17 with prior lateral MI but no ischemia. No CP. - No ASA given stable CAD and Xarelto use. - Continue statin. 2. Hyperlipidemia: On atorvastatin.  3. Chronic systolic CHF: ECHO on [id number] showed EF 50-55%. Not fluid overloaded today.  - Continue carvedilol 3.125 mg BID - Continue losartan 12.5 mg daily - Start  spironolactone 12.5 mg daily. BMET today and in 1 week. - Continue Jardiance 10 mg daily - Decrease furosemide to 40 mg daily as needed and KCl to PRN (only when he takes furosemide) - Counseled on need for daily weights, fluid restriction to <2L/day and <2 g salt intake/daily. 4. Atrial fibrillation:  He does not feel the atrial fibrillation, not sure how long it has been present.  He  failed cardioversion in 9/17. He is not a good candidate for antiarrhythmics given baseline long QT and history of CAD.  DCCV x 2 unsuccessful. - Continue low dose carvedilol. - Continue Xarelto. No bleeding. 5. Suspect sleep apnea: Encouraged him in the past to get a sleep study but he is not interested. 6. H/o rectal cancer: s/p LLQ colectomy (5/14).   Follow up in 1 week with lab, 1 month with PharmD, and 2 months with Dr. Aundra Dubin.   Dorian Furnace PharmD/MBA Candidate [facility], Class of [place], PharmD, BCPS Advanced Heart Failure Clinic Pharmacist [phone]

## 2020-09-02 ENCOUNTER — Other Ambulatory Visit: Payer: Self-pay

## 2020-09-02 ENCOUNTER — Ambulatory Visit (HOSPITAL_COMMUNITY)
Admission: RE | Admit: 2020-09-02 | Discharge: 2020-09-02 | Disposition: A | Discharge status: Home / Self Care | Payer: Medicare Other | Source: Ambulatory Visit | Attending: Internal Medicine | Admitting: Internal Medicine

## 2020-09-02 ENCOUNTER — Other Ambulatory Visit (HOSPITAL_COMMUNITY): Payer: Self-pay | Admitting: Internal Medicine

## 2020-09-02 DIAGNOSIS — I5022 Chronic systolic (congestive) heart failure: Secondary | ICD-10-CM | POA: Insufficient documentation

## 2020-09-02 LAB — BASIC METABOLIC PANEL
Anion gap: 8 (ref 5–15)
BUN: 24 mg/dL — ABNORMAL HIGH (ref 8–23)
CO2: 28 mmol/L (ref 22–32)
Calcium: 9.2 mg/dL (ref 8.9–10.3)
Chloride: 100 mmol/L (ref 98–111)
Creatinine, Ser: 1.04 mg/dL (ref 0.61–1.24)
GFR, Estimated: >60 mL/min (ref >60)
Glucose, Bld: 87 mg/dL (ref 70–99)
Potassium: 3.9 mmol/L (ref 3.5–5.1)
Sodium: 136 mmol/L (ref 135–145)

## 2020-09-09 NOTE — Progress Notes (Signed)
PCP: Dr. Gerarda Fraction Cardiology: Dr. Aundra Dubin  HPI:  75 y.o. with history of CAD s/p CABG and rectal cancer s/p abdominal perineal resection in 05/2012 presents for cardiology followup.  He had CABG x 6 in 2005.  Stress tests later in 2005 and in 2008 were normal.  As above, he had APR in 05/2012.  He had a complicated colostomy revision in 12/2012.  After this operation, he developed abdominal wall cellulitis and septic shock, also in 12/2012.  He was positive for influenza also.  His colostomy will be permanent. He had moved to [facility] but has returned to live in [place].   At a prior appointment, he was noted to be in atrial fibrillation with rate control.  He did not realize that he was in atrial fibrillation.  Holter monitor showed persistent AF.  He looked volume overloaded on exam and Lasix was started.  Echo in 09/2015 showed EF 45-50%, diffuse hypokinesis.  He underwent DCCV 10/2015 but did not hold NSR.  Lexiscan Cardiolite in 11/2015 showed EF 40%, lateral fixed defect, no ischemia.  Echo in 08/2016 showed EF 40% with wall motion abnormalities.     Returned for HF follow up with his wife 07/2020. Felt his fluid was up, started with productive cough, unresponsive to steroids/abx by PCP, sent to Pulm who treated with doxy + prednisone. LE swelling started last couple of months. Having SOB with yard work & carrying groceries, ok with on treadmill. Denied CP, dizziness, or PND/Orthopnea. Stress eating salty foods, likes pickles. No fever or chills. Weight at home was up 10 lbs. BP at home ~ 100s/60. Diuretics increased and losartan started.   On 08/06/20, he returned for HF follow up. Overall felt fine, struggled with coughing and sinus drainage issues. Denied increasing SOB, CP, dizziness, edema, or PND/Orthopnea. Appetite ok. No fever or chills. Weight at home down to 167 pounds. Taking all medications. Significantly changed diet.   Recently returned to HF clinic with his wife for pharmacist medication  titration on 08/27/20. At previous visit with FNP on 08/06/20, Jardiance 10 mg daily was started (preferred over [place] on insurance). Overall, he was feeling good. Denied any SOB, dizziness, lightheadedness, fatigue, chest pain, or palpitations. No PND, LEE, or orthopnea. Weight at home was stable. He noted that his exercise had reduced over the last couple months with it being too hot out to go for walks. Good appetite. ECHO was done in clinic and showed stable EF 50-55%.  Today he returns to HF clinic for pharmacist medication titration. At last visit with pharmacy clinic, spironolactone 12.5 mg daily was initiated and furosemide and potassium were decreased to PRN only. Overall he is feeling well today. Believes his outside activity has increased. Has been able to spend significantly longer time outside working on his deck without needing to rest. No dizziness or lightheadedness. No chest pain or palpitations. No SOB/DOE. Weight has fluctuated significantly at home since furosemide was changed to PRN only. Says he has to take the PRN furosemide 3-4 times per week. Usually needs to take doses a couple of days in a row to get his weight to decrease back down to his dry weight (165-169 lbs). Last dose of PRN furosemide was this morning. His ankles have swelled a couple of times when his weight was higher. He also notes it is more difficult to breathe and he develops a cough when his weight/fluid level is higher. No LEE, PND or orthopnea on exam today. Taking all medications as prescribed and  tolerating all medications. Patient asked for copy of visit to be sent to his PCP, Dr. Gerarda Fraction at [facility].     HF Medications: Carvedilol 3.125 mg BID Losartan 12.5 mg daily Spironolactone 12.5 mg daily Jardiance 10 mg daily  Furosemide 40 mg PRN KCl 20 mEq PRN with furosemide  Has the patient been experiencing any side effects to the medications prescribed? No   Does the patient have any problems obtaining  medications due to transportation or finances? No - has [facility] Medicare  Understanding of regimen: excellent Understanding of indications: excellent Potential of compliance: excellent Patient understands to avoid NSAIDs. Patient understands to avoid decongestants.    Pertinent Lab Values: 08/27/20: Serum creatinine 0.82, BUN 14, Potassium 4.2, Sodium 136 09/02/20: Serum creatinine 1.04, BUN 24, Potassium 3.9, Sodium 136  Vital Signs:  Weight: 166 lbs (last clinic weight: 171 lbs) Blood pressure: 108/66 mmHg Heart rate: 57 bpm  Assessment/Plan: 1. CAD: S/p CABG.  No chest pain or dyspnea.  Cardiolite in 11/2015 with prior lateral MI but no ischemia. No CP. - No ASA given stable CAD and Xarelto use. - Continue statin. 2. Hyperlipidemia: On atorvastatin.  3. Chronic systolic CHF: ECHO on 123XX123 showed EF 50-55%. Volume status stable on exam today.  - Restart furosemide 20 mg daily given high frequency of PRN furosemide use at home for weight gain/swelling. Hold potassium supplements for now. Repeat BMET in 1 week and will restart KCL if needed.  - Continue carvedilol 3.125 mg BID - Continue losartan 12.5 mg daily - Increase spironolactone to 25 mg daily. Repeat BMET in 1 week as above.   - Continue Jardiance 10 mg daily - Counseled on need for daily weights, fluid restriction to <2L/day and <2 g salt intake/daily. 4. Atrial fibrillation:  He does not feel the atrial fibrillation, not sure how long it has been present.  He failed cardioversion in 10/2015. He is not a good candidate for antiarrhythmics given baseline long QT and history of CAD.  DCCV x 2 unsuccessful. - Continue low dose carvedilol. - Continue Xarelto. No bleeding. 5. Suspect sleep apnea: Encouraged him in the past to get a sleep study but he is not interested. 6. H/o rectal cancer: s/p LLQ colectomy (5/14).   Follow up 1 week for BMET and 3 weeks with Dr. Aundra Dubin in [facility].    Audry Riles, PharmD, BCPS, BCCP,  CPP Heart Failure Clinic Pharmacist [phone]

## 2020-09-24 ENCOUNTER — Ambulatory Visit (HOSPITAL_COMMUNITY)
Admission: RE | Admit: 2020-09-24 | Discharge: 2020-09-24 | Disposition: A | Discharge status: Home / Self Care | Payer: Medicare Other | Source: Ambulatory Visit | Attending: Cardiology | Admitting: Cardiology

## 2020-09-24 ENCOUNTER — Other Ambulatory Visit: Payer: Self-pay

## 2020-09-24 VITALS — BP 108/66 | HR 57 | Wt 166.4 lb

## 2020-09-24 DIAGNOSIS — Z951 Presence of aortocoronary bypass graft: Secondary | ICD-10-CM | POA: Insufficient documentation

## 2020-09-24 DIAGNOSIS — I4819 Other persistent atrial fibrillation: Secondary | ICD-10-CM | POA: Diagnosis not present

## 2020-09-24 DIAGNOSIS — Z7984 Long term (current) use of oral hypoglycemic drugs: Secondary | ICD-10-CM | POA: Insufficient documentation

## 2020-09-24 DIAGNOSIS — I2510 Atherosclerotic heart disease of native coronary artery without angina pectoris: Secondary | ICD-10-CM | POA: Diagnosis not present

## 2020-09-24 DIAGNOSIS — I252 Old myocardial infarction: Secondary | ICD-10-CM | POA: Diagnosis not present

## 2020-09-24 DIAGNOSIS — Z79899 Other long term (current) drug therapy: Secondary | ICD-10-CM | POA: Insufficient documentation

## 2020-09-24 DIAGNOSIS — Z7901 Long term (current) use of anticoagulants: Secondary | ICD-10-CM | POA: Insufficient documentation

## 2020-09-24 DIAGNOSIS — E785 Hyperlipidemia, unspecified: Secondary | ICD-10-CM | POA: Diagnosis not present

## 2020-09-24 DIAGNOSIS — I5022 Chronic systolic (congestive) heart failure: Secondary | ICD-10-CM | POA: Diagnosis present

## 2020-09-24 MED ORDER — SPIRONOLACTONE 25 MG PO TABS: 25.0000 mg | ORAL_TABLET | Freq: Every day | ORAL | 6 refills | Status: DC

## 2020-09-24 MED ORDER — POTASSIUM CHLORIDE CRYS ER 20 MEQ PO TBCR: 20.0000 meq | EXTENDED_RELEASE_TABLET | Freq: Every day | ORAL | 3 refills | Status: DC | PRN

## 2020-09-24 MED ORDER — FUROSEMIDE 40 MG PO TABS: 20.0000 mg | ORAL_TABLET | Freq: Every day | ORAL | 6 refills | Status: DC

## 2020-09-24 NOTE — Patient Instructions (Addendum)
It was a pleasure seeing you today!  MEDICATIONS: -We are changing your medications today -Increase spironolactone to 25 mg (1 tablet) daily -Start furosemide 20 mg (1/2 tablet) daily -Call if you have questions about your medications.   NEXT APPOINTMENT: Return to clinic in 3 weeks with Dr. Aundra Dubin.  In general, to take care of your heart failure: -Limit your fluid intake to 2 Liters (half-gallon) per day.   -Limit your salt intake to ideally 2-3 grams (2000-3000 mg) per day. -Weigh yourself daily and record, and bring that "weight diary" to your next appointment.  (Weight gain of 2-3 pounds in 1 day typically means fluid weight.) -The medications for your heart are to help your heart and help you live longer.   -Please contact us before stopping any of your heart medications.  Call the clinic at [phone] with questions or to reschedule future appointments.

## 2020-10-02 ENCOUNTER — Other Ambulatory Visit (HOSPITAL_COMMUNITY): Payer: Self-pay | Admitting: Internal Medicine

## 2020-10-02 ENCOUNTER — Ambulatory Visit (HOSPITAL_COMMUNITY)
Admission: RE | Admit: 2020-10-02 | Discharge: 2020-10-02 | Disposition: A | Discharge status: Home / Self Care | Payer: Medicare Other | Source: Ambulatory Visit | Attending: Internal Medicine | Admitting: Internal Medicine

## 2020-10-02 ENCOUNTER — Encounter (HOSPITAL_COMMUNITY): Payer: Self-pay

## 2020-10-02 ENCOUNTER — Other Ambulatory Visit: Payer: Self-pay

## 2020-10-02 DIAGNOSIS — I5022 Chronic systolic (congestive) heart failure: Secondary | ICD-10-CM

## 2020-10-02 LAB — BASIC METABOLIC PANEL
Anion gap: 10 (ref 5–15)
BUN: 21 mg/dL (ref 8–23)
CO2: 30 mmol/L (ref 22–32)
Calcium: 9.3 mg/dL (ref 8.9–10.3)
Chloride: 97 mmol/L — ABNORMAL LOW (ref 98–111)
Creatinine, Ser: 0.96 mg/dL (ref 0.61–1.24)
GFR, Estimated: >60 mL/min (ref >60)
Glucose, Bld: 94 mg/dL (ref 70–99)
Potassium: 3.9 mmol/L (ref 3.5–5.1)
Sodium: 137 mmol/L (ref 135–145)

## 2020-10-16 ENCOUNTER — Other Ambulatory Visit: Payer: Self-pay

## 2020-10-16 ENCOUNTER — Other Ambulatory Visit (HOSPITAL_COMMUNITY): Payer: Medicare Other

## 2020-10-16 ENCOUNTER — Encounter (HOSPITAL_COMMUNITY): Payer: Self-pay | Admitting: Cardiology

## 2020-10-16 ENCOUNTER — Ambulatory Visit (HOSPITAL_COMMUNITY)
Admission: RE | Admit: 2020-10-16 | Discharge: 2020-10-16 | Disposition: A | Discharge status: Home / Self Care | Payer: Medicare Other | Source: Ambulatory Visit | Attending: Cardiology | Admitting: Cardiology

## 2020-10-16 VITALS — BP 98/60 | HR 82 | Wt 172.2 lb

## 2020-10-16 DIAGNOSIS — I5022 Chronic systolic (congestive) heart failure: Secondary | ICD-10-CM | POA: Insufficient documentation

## 2020-10-16 DIAGNOSIS — Z8249 Family history of ischemic heart disease and other diseases of the circulatory system: Secondary | ICD-10-CM | POA: Diagnosis not present

## 2020-10-16 DIAGNOSIS — Z7901 Long term (current) use of anticoagulants: Secondary | ICD-10-CM | POA: Diagnosis not present

## 2020-10-16 DIAGNOSIS — I252 Old myocardial infarction: Secondary | ICD-10-CM | POA: Diagnosis not present

## 2020-10-16 DIAGNOSIS — Z933 Colostomy status: Secondary | ICD-10-CM | POA: Diagnosis not present

## 2020-10-16 DIAGNOSIS — E785 Hyperlipidemia, unspecified: Secondary | ICD-10-CM | POA: Diagnosis not present

## 2020-10-16 DIAGNOSIS — Z9049 Acquired absence of other specified parts of digestive tract: Secondary | ICD-10-CM | POA: Diagnosis not present

## 2020-10-16 DIAGNOSIS — I2510 Atherosclerotic heart disease of native coronary artery without angina pectoris: Secondary | ICD-10-CM | POA: Insufficient documentation

## 2020-10-16 DIAGNOSIS — I4891 Unspecified atrial fibrillation: Secondary | ICD-10-CM | POA: Insufficient documentation

## 2020-10-16 DIAGNOSIS — Z85048 Personal history of other malignant neoplasm of rectum, rectosigmoid junction, and anus: Secondary | ICD-10-CM | POA: Insufficient documentation

## 2020-10-16 DIAGNOSIS — Z79899 Other long term (current) drug therapy: Secondary | ICD-10-CM | POA: Insufficient documentation

## 2020-10-16 LAB — BASIC METABOLIC PANEL
Anion gap: 6 (ref 5–15)
BUN: 15 mg/dL (ref 8–23)
CO2: 32 mmol/L (ref 22–32)
Calcium: 9 mg/dL (ref 8.9–10.3)
Chloride: 102 mmol/L (ref 98–111)
Creatinine, Ser: 0.83 mg/dL (ref 0.61–1.24)
GFR, Estimated: >60 mL/min (ref >60)
Glucose, Bld: 102 mg/dL — ABNORMAL HIGH (ref 70–99)
Potassium: 4.1 mmol/L (ref 3.5–5.1)
Sodium: 140 mmol/L (ref 135–145)

## 2020-10-16 LAB — CBC
HCT: 34.9 % — ABNORMAL LOW (ref 39.0–52.0)
Hemoglobin: 11.4 g/dL — ABNORMAL LOW (ref 13.0–17.0)
MCH: 31.3 pg (ref 26.0–34.0)
MCHC: 32.7 g/dL (ref 30.0–36.0)
MCV: 95.9 fL (ref 80.0–100.0)
Platelets: 179 10*3/uL (ref 150–400)
RBC: 3.64 MIL/uL — ABNORMAL LOW (ref 4.22–5.81)
RDW: 16 % — ABNORMAL HIGH (ref 11.5–15.5)
WBC: 5.2 10*3/uL (ref 4.0–10.5)
nRBC: 0 % (ref 0.0–0.2)

## 2020-10-16 NOTE — Patient Instructions (Signed)
EKG done today.   Labs done today. We will contact you only if your labs are abnormal.  No medication changes were made. Please continue all current medications as prescribed.  Your physician recommends that you schedule a follow-up appointment in: 4  months with our APP Clinic here in our office. Please contact our office in December to schedule a January appointment.   If you have any questions or concerns before your next appointment please send [place] a message through [place] or call our office at [phone].    TO LEAVE A MESSAGE FOR THE NURSE SELECT OPTION 2, PLEASE LEAVE A MESSAGE INCLUDING: YOUR NAME DATE OF BIRTH CALL BACK NUMBER REASON FOR CALL**this is important as we prioritize the call backs  YOU WILL RECEIVE A CALL BACK THE SAME DAY AS LONG AS YOU CALL BEFORE 4:00 PM   Do the following things EVERYDAY: Weigh yourself in the morning before breakfast. Write it down and keep it in a log. Take your medicines as prescribed Eat low salt foods--Limit salt (sodium) to 2000 mg per day.  Stay as active as you can everyday Limit all fluids for the day to less than 2 liters   At the [facility], you and your health needs are our priority. As part of our continuing mission to provide you with exceptional heart care, we have created designated Provider Care Teams. These Care Teams include your primary Cardiologist (physician) and Advanced Practice Providers (APPs- Physician Assistants and Nurse Practitioners) who all work together to provide you with the care you need, when you need it.   You may see any of the following providers on your designated Care Team at your next follow up: Dr Glori Bickers Dr Haynes Kerns, NP Lyda Jester, [place] Audry Riles, PharmD   Please be sure to bring in all your medications bottles to every appointment.

## 2020-10-18 NOTE — Progress Notes (Signed)
PCP: Dr. Gerarda Fraction Cardiology: Dr. Aundra Dubin   75 y.o. with history of CAD s/p CABG and rectal cancer s/p abdominal perineal resection in 5/14 presents for cardiology followup.  He had CABG x 6 in 2005.  Stress tests later in 2005 and in 2008 were normal.  As above, he had APR in 5/14.  He had a complicated colostomy revision in 12/14.  After this operation, he developed abdominal wall cellulitis and septic shock, also in 12/14.  He was positive for influenza also.  His colostomy will be permanent. He had moved to [facility] but has returned to live in [place].    At a prior appointment, he was noted to be in atrial fibrillation with rate control.  He did not realize that he was in atrial fibrillation.  Holter monitor showed persistent AF.  He looked volume overloaded on exam and Lasix was started.  Echo in 8/17 showed EF 45-50%, diffuse hypokinesis.  He underwent DCCV 9/17 but did not hold NSR.  Lexiscan Cardiolite in 10/17 showed EF 40%, lateral fixed defect, no ischemia.  Echo in 8/18 showed EF 40% with wall motion abnormalities.    Echo in 7/22 showed EF up to 50-55%, mildly decreased RV systolic  function.   Patient returns for followup of CHF.  He has been doing well recently.  Feels like energy level is good.  No problems doing yardwork, recently stained his back deck.  Keeps busy with landscaping.  No chest pain.  No significant exertional dyspnea.  No orthopnea/PND.  No BRPBR/melena. SBP runs 90s-100s, he denies lightheadedness.   ECG (personally reviewed): atrial fibrillation rate 68   Labs (3/14): LDL 83, HDL 64, TGs 203 Labs (4/14): K 3.4, creatinine 0.77 Labs (6/14): LDL 63, HDL 41 Labs (5/15): creatinine 0.78 Labs (8/17): LDL 63, HDL 52 Labs (9/17): TSH normal, K 4, creatinine 0.87 Labs (10/17): K 4.2, creatinine 0.77, BNP 108.5 Labs (12/17): HCT 41.6, LDL 68, HDL 50 Labs (6/18): K 3.9, creatinine 1.06 Labs (9/19): hgb 12.1 Labs (6/22): K 4.0, creatinine 1.18, LDL 43 Labs (9/22): K 3.9,  creatinine 0.96   PMH: 1. Rectal cancer: Abdominal perineal resection in 5/14, has permanent colostomy.  2. CAD: CABG in 2005 with LIMA-LAD, SVG-D, seq SVG-OM1/dLCx, seq SVG-PDA/PLV.  Stress myoviews in 11/05 and in 2008 were normal. 3. Hyperlipidemia 4. Atrial fibrillation: First noted 8/17.  Now chronic.  - DCCV 9/17: Converted to NSR but went back into atrial fibrillation before leaving hospital.  5. Chronic systolic CHF: Ischemic cardiomyopathy.  - Echo (8/17) with EF 45-50%, mild LVH, mildly dilated LV.   - Lexiscan Cardiolite (10/17): EF 40%, no ischemia, lateral fixed defect.  - Echo (8/18): EF 40%, anterolateral/inferolateral HK, basal to mid inferior AK, normal RV size with mildly decreased systolic function.  - TEE (12/19): EF 50%, mildly dilated and mildly dysfunctional RV, no RA mass.  - Echo (7/22): EF 50-55%, mildly decreased RV function.  6. 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     FH: CAD (brother), CVA (father), CHF (mother).    ROS: All systems were reviewed and negative except as per HPI.   Current Outpatient Medications  Medication Sig Dispense Refill   albuterol (PROVENTIL) (2.5 MG/3ML) 0.083% nebulizer solution Take 2.5 mg by nebulization every 4 (four) hours as needed for wheezing or shortness of breath.     ALPRAZolam (XANAX) 1 MG tablet Take 1 mg by mouth  4 (four) times daily as needed for anxiety or sleep.     Ascorbic Acid (VITAMIN C PO) Take 1,000 mg by mouth daily.     atorvastatin (LIPITOR) 40 MG tablet TAKE 1 TABLET BY MOUTH DAILY 30 tablet 4   carvedilol (COREG) 3.125 MG tablet TAKE ONE TABLET BY MOUTH TWICE DAILY 60 tablet 1   empagliflozin (JARDIANCE) 10 MG TABS tablet Take 1 tablet (10 mg total) by mouth daily before breakfast. 90 tablet 3   ferrous sulfate 325 (65 FE) MG EC tablet Take 325 mg by mouth 3 (three) times daily with meals.      furosemide (LASIX) 40 MG tablet Take 0.5 tablets (20 mg total) by mouth daily. May take an extra 20 mg (1/2 tablet) as needed for weight gain of 3 lbs overnight or 5 lbs in 1 week 30 tablet 6   Lifitegrast (XIIDRA) 5 % SOLN Apply 1 drop to eye in the morning and at bedtime.     losartan (COZAAR) 25 MG tablet Take 0.5 tablets (12.5 mg total) by mouth daily. 45 tablet 3   potassium chloride SA (KLOR-CON) 20 MEQ tablet Take 1 tablet (20 mEq total) by mouth daily as needed (only when taking furosemide). Only use with as needed furosemide 30 tablet 3   rivaroxaban (XARELTO) 20 MG TABS tablet Take 1 tablet (20 mg total) by mouth daily with supper. 30 tablet 2   spironolactone (ALDACTONE) 25 MG tablet Take 1 tablet (25 mg total) by mouth daily. 30 tablet 6   tamsulosin (FLOMAX) 0.4 MG CAPS Take 0.4 mg by mouth daily after breakfast.      No current facility-administered medications for this encounter.   BP 98/60   Pulse 82   Wt 78.1 kg (172 lb 3.2 oz)   SpO2 98%   BMI 26.97 kg/m   Wt Readings from Last 3 Encounters:  10/16/20 78.1 kg (172 lb 3.2 oz)  09/24/20 75.5 kg (166 lb 6.4 oz)  08/27/20 77.6 kg (171 lb)   General: NAD Neck: No JVD, no thyromegaly or thyroid nodule.  Lungs: Clear to auscultation bilaterally with normal respiratory effort. CV: Nondisplaced PMI.  Heart regular S1/S2, no S3/S4, no murmur.  No peripheral edema.  No carotid bruit.  Normal pedal pulses.  Abdomen: Soft, nontender, no hepatosplenomegaly, no distention. Colostomy.  Skin: Intact without lesions or rashes.  Neurologic: Alert and oriented x 3.  Psych: Normal affect. Extremities: No clubbing or cyanosis.  HEENT: Normal.   Assessment/Plan: 1. CAD: S/p CABG.  Cardiolite in 10/17 with prior lateral MI but no ischemia. No CP. - No ASA given stable CAD and Xarelto use.  - Continue statin.  2. Hyperlipidemia: On atorvastatin. Good lipids in 6/22.  3. Chronic systolic CHF: Echo in 123XX123 showed EF 40% with wall motion  abnormalities.  Echo in 7/22 with EF up to 50-55%, mild RV dysfunction.  He is not volume overloaded on exam.  NYHA class II symptoms.  - Continue empagliflozin 10 mg daily. - Continue lasix 20 mg daily. BMET today.  - Continue carvedilol 3.125 mg bid, no BP room to increase.    - Continue losartan 12.5 mg qhs, no BP room to increase.  4. Atrial fibrillation:  He does not feel the atrial fibrillation, not sure how long it has been present.  He failed cardioversion in 9/17. He is not a good candidate for antiarrhythmics given baseline long QT and history of CAD.  DCCV x 2 unsuccessful.  Atrial fibrillation will likely  be permanent.  - Continue low dose Coreg.  - Continue Xarelto. CBC today. 5. Suspect sleep apnea: Encouraged him in the past to get a sleep study but he is not interested. 6. H/o rectal cancer: s/p LLQ colectomy (5/14).   Followup in 4 months with APP.   Loralie Champagne 10/18/2020

## 2021-02-05 ENCOUNTER — Telehealth (HOSPITAL_COMMUNITY): Payer: Self-pay | Admitting: Pharmacy Technician

## 2021-02-05 ENCOUNTER — Other Ambulatory Visit (HOSPITAL_COMMUNITY): Payer: Self-pay

## 2021-02-05 NOTE — Telephone Encounter (Signed)
Advanced Heart Failure Patient Advocate Encounter  Patient called and requested help with Jardiance cost. Current 30 day supply through his commercial plan is now $170. I was able to successfully activate a copay card and text the information to the patient to give to the pharmacy.  Advised him to call back with issues.  RxBIN: [id number]  RxPCN: [id number]  RxGRP: [id number]  ID: [id number]  Charlann Boxer, CPhT

## 2021-03-01 ENCOUNTER — Other Ambulatory Visit: Payer: Self-pay

## 2021-03-01 ENCOUNTER — Ambulatory Visit (HOSPITAL_COMMUNITY)
Admission: RE | Admit: 2021-03-01 | Discharge: 2021-03-01 | Disposition: A | Discharge status: Home / Self Care | Payer: Medicare Other | Source: Ambulatory Visit | Attending: Cardiology | Admitting: Cardiology

## 2021-03-01 ENCOUNTER — Encounter (HOSPITAL_COMMUNITY): Payer: Self-pay | Admitting: Cardiology

## 2021-03-01 VITALS — BP 104/58 | HR 58 | Wt 180.8 lb

## 2021-03-01 DIAGNOSIS — I5022 Chronic systolic (congestive) heart failure: Secondary | ICD-10-CM

## 2021-03-01 DIAGNOSIS — I2510 Atherosclerotic heart disease of native coronary artery without angina pectoris: Secondary | ICD-10-CM | POA: Diagnosis not present

## 2021-03-01 DIAGNOSIS — Z79899 Other long term (current) drug therapy: Secondary | ICD-10-CM | POA: Insufficient documentation

## 2021-03-01 DIAGNOSIS — Z85048 Personal history of other malignant neoplasm of rectum, rectosigmoid junction, and anus: Secondary | ICD-10-CM | POA: Insufficient documentation

## 2021-03-01 DIAGNOSIS — Z933 Colostomy status: Secondary | ICD-10-CM | POA: Diagnosis not present

## 2021-03-01 DIAGNOSIS — I252 Old myocardial infarction: Secondary | ICD-10-CM | POA: Diagnosis not present

## 2021-03-01 DIAGNOSIS — E785 Hyperlipidemia, unspecified: Secondary | ICD-10-CM | POA: Diagnosis not present

## 2021-03-01 DIAGNOSIS — I4891 Unspecified atrial fibrillation: Secondary | ICD-10-CM | POA: Insufficient documentation

## 2021-03-01 DIAGNOSIS — Z7901 Long term (current) use of anticoagulants: Secondary | ICD-10-CM | POA: Diagnosis not present

## 2021-03-01 DIAGNOSIS — Z951 Presence of aortocoronary bypass graft: Secondary | ICD-10-CM | POA: Diagnosis not present

## 2021-03-01 DIAGNOSIS — Z9049 Acquired absence of other specified parts of digestive tract: Secondary | ICD-10-CM | POA: Diagnosis not present

## 2021-03-01 LAB — CBC
HCT: 39.3 % (ref 39.0–52.0)
Hemoglobin: 14 g/dL (ref 13.0–17.0)
MCH: 33.7 pg (ref 26.0–34.0)
MCHC: 35.6 g/dL (ref 30.0–36.0)
MCV: 94.7 fL (ref 80.0–100.0)
Platelets: 156 10*3/uL (ref 150–400)
RBC: 4.15 MIL/uL — ABNORMAL LOW (ref 4.22–5.81)
RDW: 12.5 % (ref 11.5–15.5)
WBC: 6.8 10*3/uL (ref 4.0–10.5)
nRBC: 0 % (ref 0.0–0.2)

## 2021-03-01 LAB — BASIC METABOLIC PANEL
Anion gap: 9 (ref 5–15)
BUN: 24 mg/dL — ABNORMAL HIGH (ref 8–23)
CO2: 28 mmol/L (ref 22–32)
Calcium: 8.8 mg/dL — ABNORMAL LOW (ref 8.9–10.3)
Chloride: 100 mmol/L (ref 98–111)
Creatinine, Ser: 0.9 mg/dL (ref 0.61–1.24)
GFR, Estimated: >60 mL/min (ref >60)
Glucose, Bld: 90 mg/dL (ref 70–99)
Potassium: 3.9 mmol/L (ref 3.5–5.1)
Sodium: 137 mmol/L (ref 135–145)

## 2021-03-01 LAB — LIPID PANEL
Cholesterol: 129 mg/dL (ref 0–200)
HDL: 47 mg/dL (ref >40)
LDL Cholesterol: 62 mg/dL (ref 0–99)
Total CHOL/HDL Ratio: 2.7 RATIO
Triglycerides: 102 mg/dL (ref <150)
VLDL: 20 mg/dL (ref 0–40)

## 2021-03-01 LAB — BRAIN NATRIURETIC PEPTIDE: B Natriuretic Peptide: 166.9 pg/mL — ABNORMAL HIGH (ref 0.0–100.0)

## 2021-03-01 MED ORDER — LOSARTAN POTASSIUM 25 MG PO TABS: 25.0000 mg | ORAL_TABLET | Freq: Every day | ORAL | 3 refills | Status: AC

## 2021-03-01 MED ORDER — FUROSEMIDE 40 MG PO TABS: 40.0000 mg | ORAL_TABLET | Freq: Every day | ORAL | 3 refills | Status: DC

## 2021-03-01 NOTE — Progress Notes (Signed)
PCP: Dr. Gerarda Fraction Cardiology: Dr. Aundra Dubin   76 y.o. with history of CAD s/p CABG and rectal cancer s/p abdominal perineal resection in 5/14 presents for cardiology followup.  He had CABG x 6 in 2005.  Stress tests later in 2005 and in 2008 were normal.  As above, he had APR in 5/14.  He had a complicated colostomy revision in 12/14.  After this operation, he developed abdominal wall cellulitis and septic shock, also in 12/14.  He was positive for influenza also.  His colostomy will be permanent. He had moved to [facility] but has returned to live in [place].    At a prior appointment, he was noted to be in atrial fibrillation with rate control.  He did not realize that he was in atrial fibrillation.  Holter monitor showed persistent AF.  He looked volume overloaded on exam and Lasix was started.  Echo in 8/17 showed EF 45-50%, diffuse hypokinesis.  He underwent DCCV 9/17 but did not hold NSR.  Lexiscan Cardiolite in 10/17 showed EF 40%, lateral fixed defect, no ischemia.  Echo in 8/18 showed EF 40% with wall motion abnormalities.    Echo in 7/22 showed EF up to 50-55%, mildly decreased RV systolic  function.   Patient returns for followup of CHF.  His weight is up about 10 lbs, he says that he gained weight over the holidays with increased eating and decreased exercise.  He has been taking Lasix 40 mg daily for about a week due to increased peripheral edema and it has improved.  Prior, he was taking 20 mg daily.  No significant exertional dyspnea or chest pain.  Rare lightheadedness if he stands up too fast. No orthopnea/PND.  Doing some walking for exercise but not as much as in the past.   ECG (personally reviewed): atrial fibrillation with nonspecific T wave flattening  REDS clip 36%  Labs (3/14): LDL 83, HDL 64, TGs 203 Labs (4/14): K 3.4, creatinine 0.77 Labs (6/14): LDL 63, HDL 41 Labs (5/15): creatinine 0.78 Labs (8/17): LDL 63, HDL 52 Labs (9/17): TSH normal, K 4, creatinine 0.87 Labs  (10/17): K 4.2, creatinine 0.77, BNP 108.5 Labs (12/17): HCT 41.6, LDL 68, HDL 50 Labs (6/18): K 3.9, creatinine 1.06 Labs (9/19): hgb 12.1 Labs (6/22): K 4.0, creatinine 1.18, LDL 43 Labs (9/22): K 3.9, creatinine 0.96   PMH: 1. Rectal cancer: Abdominal perineal resection in 5/14, has permanent colostomy.  2. CAD: CABG in 2005 with LIMA-LAD, SVG-D, seq SVG-OM1/dLCx, seq SVG-PDA/PLV.  Stress myoviews in 11/05 and in 2008 were normal. 3. Hyperlipidemia 4. Atrial fibrillation: First noted 8/17.  Now chronic.  - DCCV 9/17: Converted to NSR but went back into atrial fibrillation before leaving hospital.  5. Chronic systolic CHF: Ischemic cardiomyopathy.  - Echo (8/17) with EF 45-50%, mild LVH, mildly dilated LV.   - Lexiscan Cardiolite (10/17): EF 40%, no ischemia, lateral fixed defect.  - Echo (8/18): EF 40%, anterolateral/inferolateral HK, basal to mid inferior AK, normal RV size with mildly decreased systolic function.  - TEE (12/19): EF 50%, mildly dilated and mildly dysfunctional RV, no RA mass.  - Echo (7/22): EF 50-55%, mildly decreased RV function.  6. Bradycardia   SH: Lives in [place] with wife, quit smoking 2004, continues moderate ETOH intake, retired from Research officer, trade union and Production manager (Engineer, production).     FH: CAD (brother), CVA (father), CHF (mother).    ROS: All systems were reviewed and negative except as per HPI.   Current Outpatient Medications  Medication Sig  Dispense Refill   albuterol (PROVENTIL) (2.5 MG/3ML) 0.083% nebulizer solution Take 2.5 mg by nebulization every 4 (four) hours as needed for wheezing or shortness of breath.     ALPRAZolam (XANAX) 1 MG tablet Take 1 mg by mouth 4 (four) times daily as needed for anxiety or sleep.     Ascorbic Acid (VITAMIN C PO) Take 1,000 mg by mouth daily.     atorvastatin (LIPITOR) 40 MG tablet TAKE 1 TABLET BY MOUTH DAILY 30 tablet 4   carvedilol (COREG) 3.125 MG tablet TAKE ONE TABLET BY MOUTH TWICE DAILY 60 tablet 1   empagliflozin  (JARDIANCE) 10 MG TABS tablet Take 1 tablet (10 mg total) by mouth daily before breakfast. 90 tablet 3   ferrous sulfate 325 (65 FE) MG EC tablet Take 325 mg by mouth 3 (three) times daily with meals.     Lifitegrast (XIIDRA) 5 % SOLN Apply 1 drop to eye in the morning and at bedtime.     potassium chloride SA (KLOR-CON) 20 MEQ tablet Take 1 tablet (20 mEq total) by mouth daily as needed (only when taking furosemide). Only use with as needed furosemide 30 tablet 3   rivaroxaban (XARELTO) 20 MG TABS tablet Take 1 tablet (20 mg total) by mouth daily with supper. 30 tablet 2   spironolactone (ALDACTONE) 25 MG tablet Take 1 tablet (25 mg total) by mouth daily. 30 tablet 6   tamsulosin (FLOMAX) 0.4 MG CAPS Take 0.4 mg by mouth daily after breakfast.      furosemide (LASIX) 40 MG tablet Take 1 tablet (40 mg total) by mouth daily. May take an extra 20 mg (1/2 tablet) as needed for weight gain of 3 lbs overnight or 5 lbs in 1 week 90 tablet 3   losartan (COZAAR) 25 MG tablet Take 1 tablet (25 mg total) by mouth daily. 90 tablet 3   No current facility-administered medications for this encounter.   BP (!) 104/58    Pulse (!) 58    Wt 82 kg (180 lb 12.8 oz)    SpO2 97%    BMI 28.32 kg/m   Wt Readings from Last 3 Encounters:  03/01/21 82 kg (180 lb 12.8 oz)  10/16/20 78.1 kg (172 lb 3.2 oz)  09/24/20 75.5 kg (166 lb 6.4 oz)   General: NAD Neck: JVP 7-8 cm, no thyromegaly or thyroid nodule.  Lungs: Clear to auscultation bilaterally with normal respiratory effort. CV: Nondisplaced PMI.  Heart irregular S1/S2, no S3/S4, no murmur.  No peripheral edema.  No carotid bruit.  Normal pedal pulses.  Abdomen: Soft, nontender, no hepatosplenomegaly, no distention.  Skin: Intact without lesions or rashes.  Neurologic: Alert and oriented x 3.  Psych: Normal affect. Extremities: No clubbing or cyanosis.  HEENT: Normal.   Assessment/Plan: 1. CAD: S/p CABG.  Cardiolite in 10/17 with prior lateral MI but no  ischemia. No CP. - No ASA given stable CAD and Xarelto use.  - Continue statin.  2. Hyperlipidemia: On atorvastatin. Check lipids today.  3. Chronic systolic CHF: Echo in 5/95 showed EF 40% with wall motion abnormalities.  Echo in 7/22 with EF up to 50-55%, mild RV dysfunction.  NYHA class II symptoms.  Very mild volume overload on exam with REDS clip mildly elevated.  He recently increased his Lasix to 40 mg daily which I suspect has improved his volume status.  - Continue Lasix at 40 mg daily.   - Continue empagliflozin 10 mg daily. - Continue carvedilol 3.125 mg bid.    -  Increase losartan to 25 mg daily.  BMET/BNP today, BMET in 10 days.  - Continue spironolactone 25 mg daily.   4. Atrial fibrillation:  He does not feel the atrial fibrillation, not sure how long it has been present.  He failed cardioversion in 9/17. He is not a good candidate for antiarrhythmics given baseline long QT and history of CAD.  DCCV x 2 unsuccessful.  Atrial fibrillation will likely be permanent.  - Continue low dose Coreg.  - Continue Xarelto. CBC today. 5. Suspect sleep apnea: Encouraged him in the past to get a sleep study but he is not interested. 6. H/o rectal cancer: s/p LLQ colectomy (5/14).   Followup in 6 months with APP, BMET in 3 months.    Loralie Champagne 03/01/2021

## 2021-03-01 NOTE — Patient Instructions (Addendum)
EKG done today.  RedsClip done today.   Labs done today. We will contact you only if your labs are abnormal.  INCREASE Losartan to 25mg  (1 tablet) by mouth daily.  INCREASE Lasix to 40mg  (1 tablet) by mouth daily.   No other medication changes were made. Please continue all current medications as prescribed.  Your physician recommends that you schedule a follow-up appointment in: 10 days for a lab only appointment, 3 months for a lab only appointment and in 6 months with our NP/[facility] Clinic here in our office.   If you have any questions or concerns before your next appointment please send [place] a message through [place] or call our office at [phone].    TO LEAVE A MESSAGE FOR THE NURSE SELECT OPTION 2, PLEASE LEAVE A MESSAGE INCLUDING: YOUR NAME DATE OF BIRTH CALL BACK NUMBER REASON FOR CALL**this is important as we prioritize the call backs  YOU WILL RECEIVE A CALL BACK THE SAME DAY AS LONG AS YOU CALL BEFORE 4:00 PM   Do the following things EVERYDAY: Weigh yourself in the morning before breakfast. Write it down and keep it in a log. Take your medicines as prescribed Eat low salt foods--Limit salt (sodium) to 2000 mg per day.  Stay as active as you can everyday Limit all fluids for the day to less than 2 liters   At the [facility], you and your health needs are our priority. As part of our continuing mission to provide you with exceptional heart care, we have created designated Provider Care Teams. These Care Teams include your primary Cardiologist (physician) and Advanced Practice Providers (APPs- Physician Assistants and Nurse Practitioners) who all work together to provide you with the care you need, when you need it.   You may see any of the following providers on your designated Care Team at your next follow up: Dr Glori Bickers Dr Haynes Kerns, NP Lyda Jester, [place] Audry Riles, PharmD   Please be sure to bring in all your  medications bottles to every appointment.

## 2021-03-01 NOTE — Progress Notes (Signed)
ReDS Vest / Clip - 03/01/21 1000       ReDS Vest / Clip   Station Marker C    Ruler Value 31    ReDS Value Range Moderate volume overload    ReDS Actual Value 36

## 2021-03-11 ENCOUNTER — Other Ambulatory Visit: Payer: Self-pay

## 2021-03-11 ENCOUNTER — Ambulatory Visit (HOSPITAL_COMMUNITY)
Admission: RE | Admit: 2021-03-11 | Discharge: 2021-03-11 | Disposition: A | Discharge status: Home / Self Care | Payer: Medicare Other | Source: Ambulatory Visit | Attending: Cardiology | Admitting: Cardiology

## 2021-03-11 DIAGNOSIS — I5022 Chronic systolic (congestive) heart failure: Secondary | ICD-10-CM | POA: Diagnosis present

## 2021-03-11 LAB — BASIC METABOLIC PANEL
Anion gap: 11 (ref 5–15)
BUN: 31 mg/dL — ABNORMAL HIGH (ref 8–23)
CO2: 27 mmol/L (ref 22–32)
Calcium: 8.8 mg/dL — ABNORMAL LOW (ref 8.9–10.3)
Chloride: 101 mmol/L (ref 98–111)
Creatinine, Ser: 1.13 mg/dL (ref 0.61–1.24)
GFR, Estimated: >60 mL/min (ref >60)
Glucose, Bld: 128 mg/dL — ABNORMAL HIGH (ref 70–99)
Potassium: 3.9 mmol/L (ref 3.5–5.1)
Sodium: 139 mmol/L (ref 135–145)

## 2021-04-19 ENCOUNTER — Other Ambulatory Visit (HOSPITAL_COMMUNITY): Payer: Self-pay | Admitting: Cardiology

## 2021-05-31 ENCOUNTER — Ambulatory Visit (HOSPITAL_COMMUNITY)
Admission: RE | Admit: 2021-05-31 | Discharge: 2021-05-31 | Disposition: A | Discharge status: Home / Self Care | Payer: Medicare Other | Source: Ambulatory Visit | Attending: Cardiology | Admitting: Cardiology

## 2021-05-31 DIAGNOSIS — I5022 Chronic systolic (congestive) heart failure: Secondary | ICD-10-CM | POA: Insufficient documentation

## 2021-05-31 LAB — BASIC METABOLIC PANEL
Anion gap: 8 (ref 5–15)
BUN: 15 mg/dL (ref 8–23)
CO2: 29 mmol/L (ref 22–32)
Calcium: 9.4 mg/dL (ref 8.9–10.3)
Chloride: 103 mmol/L (ref 98–111)
Creatinine, Ser: 0.89 mg/dL (ref 0.61–1.24)
GFR, Estimated: >60 mL/min (ref >60)
Glucose, Bld: 98 mg/dL (ref 70–99)
Potassium: 4.3 mmol/L (ref 3.5–5.1)
Sodium: 140 mmol/L (ref 135–145)

## 2021-06-21 ENCOUNTER — Telehealth (HOSPITAL_COMMUNITY): Payer: Self-pay | Admitting: *Deleted

## 2021-06-21 ENCOUNTER — Other Ambulatory Visit (HOSPITAL_COMMUNITY): Payer: Self-pay | Admitting: Family Medicine

## 2021-06-21 NOTE — Telephone Encounter (Signed)
OK to stay off spironolactone.  Side effect was likely from spironolactone.  If EF drops on future echoes, could use eplerenone instead.

## 2021-06-21 NOTE — Telephone Encounter (Signed)
Pt left vm requesting a return call about med side effects. I called pt back no answer /left vm for return call.

## 2021-06-21 NOTE — Telephone Encounter (Signed)
Pt returned my call stating since starting spironolactone he had breast tenderness and swelling of breasts. Pts pcp advised him to stop spiro. Pt wanted Dr.McLean to be aware and also see if he needed to make any other changes.  Routed to [place] for advice

## 2021-08-30 ENCOUNTER — Encounter (HOSPITAL_COMMUNITY): Payer: Medicare Other

## 2021-09-24 ENCOUNTER — Ambulatory Visit (HOSPITAL_COMMUNITY)
Admission: RE | Admit: 2021-09-24 | Discharge: 2021-09-24 | Disposition: A | Discharge status: Home / Self Care | Payer: Medicare Other | Source: Ambulatory Visit | Attending: Family Medicine | Admitting: Family Medicine

## 2021-09-24 ENCOUNTER — Encounter (HOSPITAL_COMMUNITY): Payer: Self-pay

## 2021-09-24 ENCOUNTER — Other Ambulatory Visit (HOSPITAL_COMMUNITY): Payer: Self-pay

## 2021-09-24 VITALS — BP 120/62 | HR 60 | Wt 182.8 lb

## 2021-09-24 DIAGNOSIS — E785 Hyperlipidemia, unspecified: Secondary | ICD-10-CM

## 2021-09-24 DIAGNOSIS — G4730 Sleep apnea, unspecified: Secondary | ICD-10-CM

## 2021-09-24 DIAGNOSIS — Z79899 Other long term (current) drug therapy: Secondary | ICD-10-CM | POA: Diagnosis not present

## 2021-09-24 DIAGNOSIS — I4819 Other persistent atrial fibrillation: Secondary | ICD-10-CM | POA: Insufficient documentation

## 2021-09-24 DIAGNOSIS — Z7984 Long term (current) use of oral hypoglycemic drugs: Secondary | ICD-10-CM | POA: Diagnosis not present

## 2021-09-24 DIAGNOSIS — I252 Old myocardial infarction: Secondary | ICD-10-CM | POA: Diagnosis not present

## 2021-09-24 DIAGNOSIS — Z9049 Acquired absence of other specified parts of digestive tract: Secondary | ICD-10-CM | POA: Diagnosis not present

## 2021-09-24 DIAGNOSIS — I5022 Chronic systolic (congestive) heart failure: Secondary | ICD-10-CM

## 2021-09-24 DIAGNOSIS — I2510 Atherosclerotic heart disease of native coronary artery without angina pectoris: Secondary | ICD-10-CM | POA: Diagnosis not present

## 2021-09-24 DIAGNOSIS — Z85048 Personal history of other malignant neoplasm of rectum, rectosigmoid junction, and anus: Secondary | ICD-10-CM

## 2021-09-24 DIAGNOSIS — Z7901 Long term (current) use of anticoagulants: Secondary | ICD-10-CM | POA: Diagnosis not present

## 2021-09-24 DIAGNOSIS — Z951 Presence of aortocoronary bypass graft: Secondary | ICD-10-CM | POA: Diagnosis not present

## 2021-09-24 DIAGNOSIS — I4891 Unspecified atrial fibrillation: Secondary | ICD-10-CM

## 2021-09-24 DIAGNOSIS — Z933 Colostomy status: Secondary | ICD-10-CM | POA: Diagnosis not present

## 2021-09-24 LAB — BASIC METABOLIC PANEL
Anion gap: 10 (ref 5–15)
BUN: 11 mg/dL (ref 8–23)
CO2: 32 mmol/L (ref 22–32)
Calcium: 9 mg/dL (ref 8.9–10.3)
Chloride: 97 mmol/L — ABNORMAL LOW (ref 98–111)
Creatinine, Ser: 0.99 mg/dL (ref 0.61–1.24)
GFR, Estimated: >60 mL/min (ref >60)
Glucose, Bld: 99 mg/dL (ref 70–99)
Potassium: 3.4 mmol/L — ABNORMAL LOW (ref 3.5–5.1)
Sodium: 139 mmol/L (ref 135–145)

## 2021-09-24 LAB — BRAIN NATRIURETIC PEPTIDE: B Natriuretic Peptide: 180.4 pg/mL — ABNORMAL HIGH (ref 0.0–100.0)

## 2021-09-24 MED ORDER — EPLERENONE 25 MG PO TABS: 12.5000 mg | ORAL_TABLET | Freq: Every day | ORAL | 2 refills | Status: DC

## 2021-09-24 NOTE — Patient Instructions (Addendum)
Thank you for coming in today  Labs were done today, if any labs are abnormal the clinic will call you No news is good news  START Eplerenone 12.5 mg 1/2 tablet daily    Your physician recommends that you return for lab work in: 1 week BMET  Your physician recommends that you schedule a follow-up appointment in:  6 months with echocardiogram  Your physician has requested that you have an echocardiogram. Echocardiography is a painless test that uses sound waves to create images of your heart. It provides your doctor with information about the size and shape of your heart and how well your heart's chambers and valves are working. This procedure takes approximately one hour. There are no restrictions for this procedure.     Do the following things EVERYDAY: Weigh yourself in the morning before breakfast. Write it down and keep it in a log. Take your medicines as prescribed Eat low salt foods--Limit salt (sodium) to 2000 mg per day.  Stay as active as you can everyday Limit all fluids for the day to less than 2 liters  At the [facility], you and your health needs are our priority. As part of our continuing mission to provide you with exceptional heart care, we have created designated Provider Care Teams. These Care Teams include your primary Cardiologist (physician) and Advanced Practice Providers (APPs- Physician Assistants and Nurse Practitioners) who all work together to provide you with the care you need, when you need it.   You may see any of the following providers on your designated Care Team at your next follow up: Dr Glori Bickers Dr Loralie Champagne Dr. Roxana Hires, NP Lyda Jester, [place] [facility] [place], [place] Forestine Na, NP Audry Riles, PharmD   Please be sure to bring in all your medications bottles to every appointment.   If you have any questions or concerns before your next appointment please send [place] a message through  [place] or call our office at [phone].    TO LEAVE A MESSAGE FOR THE NURSE SELECT OPTION 2, PLEASE LEAVE A MESSAGE INCLUDING: YOUR NAME DATE OF BIRTH CALL BACK NUMBER REASON FOR CALL**this is important as we prioritize the call backs  YOU WILL RECEIVE A CALL BACK THE SAME DAY AS LONG AS YOU CALL BEFORE 4:00 PM

## 2021-09-24 NOTE — Progress Notes (Signed)
PCP: Dr. Gerarda Fraction Cardiology: Dr. Aundra Dubin   76 y.o. with history of CAD s/p CABG and rectal cancer s/p abdominal perineal resection in 5/14 presents for cardiology followup.  He had CABG x 6 in 2005.  Stress tests later in 2005 and in 2008 were normal.  As above, he had APR in 5/14.  He had a complicated colostomy revision in 12/14.  After this operation, he developed abdominal wall cellulitis and septic shock, also in 12/14.  He was positive for influenza also.  His colostomy will be permanent. He had moved to [facility] but has returned to live in [place].    At a prior appointment, he was noted to be in atrial fibrillation with rate control.  He did not realize that he was in atrial fibrillation.  Holter monitor showed persistent AF.  He looked volume overloaded on exam and Lasix was started.  Echo in 8/17 showed EF 45-50%, diffuse hypokinesis.  He underwent DCCV 9/17 but did not hold NSR.  Lexiscan Cardiolite in 10/17 showed EF 40%, lateral fixed defect, no ischemia.  Echo in 8/18 showed EF 40% with wall motion abnormalities.    Echo in 7/22 showed EF up to 50-55%, mildly decreased RV systolic function.   Follow up 1/23, NYHA II, volume mildly up and instructed to increase Lasix to 40 mg daily.  Today he returns for HF follow up with his wife. Overall feeling fine. He is not short of breath working in his garden or with ADLs. He walks on treadmill daily (~2 miles). Denies palpitations, CP, dizziness, edema, or PND/Orthopnea. Appetite ok. No fever or chills. Weight at home 180 pounds. Taking all medications. Takes 3-4 tablets of lasix daily. Likes to eat pickled peppers.   ECG (personally reviewed): none ordered today.  Labs (3/14): LDL 83, HDL 64, TGs 203 Labs (4/14): K 3.4, creatinine 0.77 Labs (6/14): LDL 63, HDL 41 Labs (5/15): creatinine 0.78 Labs (8/17): LDL 63, HDL 52 Labs (9/17): TSH normal, K 4, creatinine 0.87 Labs (10/17): K 4.2, creatinine 0.77, BNP 108.5 Labs (12/17): HCT 41.6, LDL  68, HDL 50 Labs (6/18): K 3.9, creatinine 1.06 Labs (9/19): hgb 12.1 Labs (6/22): K 4.0, creatinine 1.18, LDL 43 Labs (9/22): K 3.9, creatinine 0.96 Labs (5/23): K 4.3, creatinine 0.89   PMH: 1. Rectal cancer: Abdominal perineal resection in 5/14, has permanent colostomy.  2. CAD: CABG in 2005 with LIMA-LAD, SVG-D, seq SVG-OM1/dLCx, seq SVG-PDA/PLV.  Stress myoviews in 11/05 and in 2008 were normal. 3. Hyperlipidemia 4. Atrial fibrillation: First noted 8/17.  Now chronic.  - DCCV 9/17: Converted to NSR but went back into atrial fibrillation before leaving hospital.  5. Chronic systolic CHF: Ischemic cardiomyopathy.  - Echo (8/17) with EF 45-50%, mild LVH, mildly dilated LV.   - Lexiscan Cardiolite (10/17): EF 40%, no ischemia, lateral fixed defect.  - Echo (8/18): EF 40%, anterolateral/inferolateral HK, basal to mid inferior AK, normal RV size with mildly decreased systolic function.  - TEE (12/19): EF 50%, mildly dilated and mildly dysfunctional RV, no RA mass.  - Echo (7/22): EF 50-55%, mildly decreased RV function.  6. Bradycardia   SH: Lives in [place] with wife, quit smoking 2004, continues moderate ETOH intake, retired from Research officer, trade union and Production manager (Engineer, production).     FH: CAD (brother), CVA (father), CHF (mother).    ROS: All systems were reviewed and negative except as per HPI.   Current Outpatient Medications  Medication Sig Dispense Refill   albuterol (PROVENTIL) (2.5 MG/3ML) 0.083% nebulizer solution Take  2.5 mg by nebulization every 4 (four) hours as needed for wheezing or shortness of breath.     ALPRAZolam (XANAX) 1 MG tablet Take 1 mg by mouth 4 (four) times daily as needed for anxiety or sleep.     Ascorbic Acid (VITAMIN C PO) Take 1,000 mg by mouth daily.     atorvastatin (LIPITOR) 40 MG tablet TAKE 1 TABLET BY MOUTH DAILY 30 tablet 4   carvedilol (COREG) 3.125 MG tablet TAKE ONE TABLET BY MOUTH TWICE DAILY 60 tablet 1   eplerenone (INSPRA) 25 MG tablet Take 0.5 tablets  (12.5 mg total) by mouth daily. 30 tablet 2   ferrous sulfate 325 (65 FE) MG EC tablet Take 325 mg by mouth 3 (three) times daily with meals.     furosemide (LASIX) 40 MG tablet Patient takes 3-4 tablets by mouth daily as needed for weight gain or fluid retention.     JARDIANCE 10 MG TABS tablet Take 1 tablet (10 mg total) by mouth daily before breakfast. 90 tablet 3   Lifitegrast (XIIDRA) 5 % SOLN Apply 1 drop to eye in the morning and at bedtime.     losartan (COZAAR) 25 MG tablet Take 1 tablet (25 mg total) by mouth daily. 90 tablet 3   potassium chloride (KLOR-CON) 20 MEQ packet Take 20 mEq by mouth daily.     rivaroxaban (XARELTO) 20 MG TABS tablet Take 1 tablet (20 mg total) by mouth daily with supper. 30 tablet 2   tamsulosin (FLOMAX) 0.4 MG CAPS Take 0.4 mg by mouth daily after breakfast.      No current facility-administered medications for this encounter.   BP 120/62   Pulse 60   Wt 82.9 kg (182 lb 12.8 oz)   SpO2 94%   BMI 28.63 kg/m   Wt Readings from Last 3 Encounters:  09/24/21 82.9 kg (182 lb 12.8 oz)  03/01/21 82 kg (180 lb 12.8 oz)  10/16/20 78.1 kg (172 lb 3.2 oz)   Physical Exam: General:  NAD. No resp difficulty HEENT: Normal Neck: Supple. No JVD. Carotids 2+ bilat; no bruits. No lymphadenopathy or thryomegaly appreciated. Cor: PMI nondisplaced. Irregular rate & rhythm. No rubs, gallops or murmurs. Lungs: Clear Abdomen: Soft, nontender, nondistended. No hepatosplenomegaly. No bruits or masses. Good bowel sounds. Extremities: No cyanosis, clubbing, rash, edema Neuro: Alert & oriented x 3, cranial nerves grossly intact. Moves all 4 extremities w/o difficulty. Affect pleasant.  Assessment/Plan: 1. CAD: S/p CABG.  Cardiolite in 10/17 with prior lateral MI but no ischemia. No CP. - No ASA given stable CAD and Xarelto use.  - Continue statin.  2. Hyperlipidemia: On atorvastatin. Check lipids today.  3. Chronic systolic CHF: Echo in 8/11 showed EF 40% with wall  motion abnormalities.  Echo in 7/22 with EF up to 50-55%, mild RV dysfunction.  NYHA class II symptoms, he is not volume overloaded today. - Start eplerenone 12.5 mg daily. BMET today, repeat in 1 week (off spiro w/ gynecomastia) - Continue Lasix 40 mg daily.   - Continue empagliflozin 10 mg daily. - Continue carvedilol 3.125 mg bid.    - Continue losartan 25 mg daily.   - Repeat echo next visit. 4. Atrial fibrillation:  He does not feel the atrial fibrillation, not sure how long it has been present.  He failed cardioversion in 9/17. He is not a good candidate for antiarrhythmics given baseline long QT and history of CAD.  DCCV x 2 unsuccessful.  Atrial fibrillation will likely be  permanent.  - Continue low dose Coreg.  - Continue Xarelto. No bleeding issues. 5. Suspect sleep apnea: Encouraged him in the past to get a sleep study but he is not interested. 6. H/o rectal cancer: s/p LLQ colectomy (5/14).   Followup in 6 months with Dr. Aundra Dubin w/ echo.  Maricela Bo [facility] FNP-BC 09/24/2021

## 2021-09-30 ENCOUNTER — Ambulatory Visit (HOSPITAL_COMMUNITY)
Admission: RE | Admit: 2021-09-30 | Discharge: 2021-09-30 | Disposition: A | Discharge status: Home / Self Care | Payer: Medicare Other | Source: Ambulatory Visit | Attending: Internal Medicine | Admitting: Internal Medicine

## 2021-09-30 DIAGNOSIS — I5022 Chronic systolic (congestive) heart failure: Secondary | ICD-10-CM | POA: Insufficient documentation

## 2021-09-30 LAB — BASIC METABOLIC PANEL
Anion gap: 11 (ref 5–15)
BUN: 22 mg/dL (ref 8–23)
CO2: 29 mmol/L (ref 22–32)
Calcium: 9.2 mg/dL (ref 8.9–10.3)
Chloride: 102 mmol/L (ref 98–111)
Creatinine, Ser: 1.05 mg/dL (ref 0.61–1.24)
GFR, Estimated: >60 mL/min (ref >60)
Glucose, Bld: 97 mg/dL (ref 70–99)
Potassium: 3.9 mmol/L (ref 3.5–5.1)
Sodium: 142 mmol/L (ref 135–145)

## 2021-10-07 ENCOUNTER — Other Ambulatory Visit (HOSPITAL_COMMUNITY): Payer: Self-pay | Admitting: Cardiology

## 2022-03-28 ENCOUNTER — Ambulatory Visit (HOSPITAL_COMMUNITY)
Admission: RE | Admit: 2022-03-28 | Discharge: 2022-03-28 | Disposition: A | Discharge status: Home / Self Care | Payer: Medicare Other | Source: Ambulatory Visit | Attending: Family Medicine | Admitting: Family Medicine

## 2022-03-28 ENCOUNTER — Ambulatory Visit (HOSPITAL_BASED_OUTPATIENT_CLINIC_OR_DEPARTMENT_OTHER)
Admission: RE | Admit: 2022-03-28 | Discharge: 2022-03-28 | Disposition: A | Discharge status: Home / Self Care | Payer: Medicare Other | Source: Ambulatory Visit | Attending: Cardiology | Admitting: Cardiology

## 2022-03-28 VITALS — BP 140/89 | HR 87 | Wt 188.0 lb

## 2022-03-28 DIAGNOSIS — Z7984 Long term (current) use of oral hypoglycemic drugs: Secondary | ICD-10-CM | POA: Diagnosis not present

## 2022-03-28 DIAGNOSIS — N62 Hypertrophy of breast: Secondary | ICD-10-CM | POA: Diagnosis not present

## 2022-03-28 DIAGNOSIS — Z9049 Acquired absence of other specified parts of digestive tract: Secondary | ICD-10-CM | POA: Diagnosis not present

## 2022-03-28 DIAGNOSIS — I4891 Unspecified atrial fibrillation: Secondary | ICD-10-CM | POA: Diagnosis not present

## 2022-03-28 DIAGNOSIS — Z933 Colostomy status: Secondary | ICD-10-CM | POA: Diagnosis not present

## 2022-03-28 DIAGNOSIS — Z951 Presence of aortocoronary bypass graft: Secondary | ICD-10-CM | POA: Insufficient documentation

## 2022-03-28 DIAGNOSIS — E785 Hyperlipidemia, unspecified: Secondary | ICD-10-CM | POA: Insufficient documentation

## 2022-03-28 DIAGNOSIS — I2510 Atherosclerotic heart disease of native coronary artery without angina pectoris: Secondary | ICD-10-CM | POA: Diagnosis present

## 2022-03-28 DIAGNOSIS — Z794 Long term (current) use of insulin: Secondary | ICD-10-CM | POA: Diagnosis not present

## 2022-03-28 DIAGNOSIS — Z79899 Other long term (current) drug therapy: Secondary | ICD-10-CM | POA: Insufficient documentation

## 2022-03-28 DIAGNOSIS — Z85048 Personal history of other malignant neoplasm of rectum, rectosigmoid junction, and anus: Secondary | ICD-10-CM | POA: Diagnosis not present

## 2022-03-28 DIAGNOSIS — I5022 Chronic systolic (congestive) heart failure: Secondary | ICD-10-CM

## 2022-03-28 DIAGNOSIS — Z7901 Long term (current) use of anticoagulants: Secondary | ICD-10-CM | POA: Insufficient documentation

## 2022-03-28 DIAGNOSIS — Z7951 Long term (current) use of inhaled steroids: Secondary | ICD-10-CM | POA: Insufficient documentation

## 2022-03-28 DIAGNOSIS — I252 Old myocardial infarction: Secondary | ICD-10-CM | POA: Diagnosis not present

## 2022-03-28 DIAGNOSIS — I447 Left bundle-branch block, unspecified: Secondary | ICD-10-CM | POA: Insufficient documentation

## 2022-03-28 LAB — LIPID PANEL
Cholesterol: 152 mg/dL (ref 0–200)
HDL: 76 mg/dL (ref >40)
LDL Cholesterol: 61 mg/dL (ref 0–99)
Total CHOL/HDL Ratio: 2 RATIO
Triglycerides: 75 mg/dL (ref <150)
VLDL: 15 mg/dL (ref 0–40)

## 2022-03-28 LAB — ECHOCARDIOGRAM COMPLETE
AR max vel: 2.74 cm2
AV Area VTI: 2.51 cm2
AV Area mean vel: 2.73 cm2
AV Mean grad: 5 mmHg
AV Peak grad: 9 mmHg
Ao pk vel: 1.5 m/s
Est EF: 50
S' Lateral: 3.4 cm

## 2022-03-28 LAB — BASIC METABOLIC PANEL
Anion gap: 13 (ref 5–15)
BUN: 20 mg/dL (ref 8–23)
CO2: 27 mmol/L (ref 22–32)
Calcium: 8.9 mg/dL (ref 8.9–10.3)
Chloride: 99 mmol/L (ref 98–111)
Creatinine, Ser: 0.97 mg/dL (ref 0.61–1.24)
GFR, Estimated: >60 mL/min (ref >60)
Glucose, Bld: 86 mg/dL (ref 70–99)
Potassium: 4.3 mmol/L (ref 3.5–5.1)
Sodium: 139 mmol/L (ref 135–145)

## 2022-03-28 LAB — BRAIN NATRIURETIC PEPTIDE: B Natriuretic Peptide: 497.9 pg/mL — ABNORMAL HIGH (ref 0.0–100.0)

## 2022-03-28 MED ORDER — TORSEMIDE 20 MG PO TABS: 60.0000 mg | ORAL_TABLET | Freq: Every day | ORAL | 3 refills | Status: DC

## 2022-03-28 NOTE — Patient Instructions (Signed)
STOP Lasix  START Torsemide '60mg'$  daily.  Labs done today, your results will be available in MyChart, we will contact you for abnormal readings.  Repeat blood work in 10 days.  Your physician recommends that you schedule a follow-up appointment in: 3 weeks  If you have any questions or concerns before your next appointment please send [place] a message through [place] or call our office at [phone].    TO LEAVE A MESSAGE FOR THE NURSE SELECT OPTION 2, PLEASE LEAVE A MESSAGE INCLUDING: YOUR NAME DATE OF BIRTH CALL BACK NUMBER REASON FOR CALL**this is important as we prioritize the call backs  YOU WILL RECEIVE A CALL BACK THE SAME DAY AS LONG AS YOU CALL BEFORE 4:00 PM  At the [facility], you and your health needs are our priority. As part of our continuing mission to provide you with exceptional heart care, we have created designated Provider Care Teams. These Care Teams include your primary Cardiologist (physician) and Advanced Practice Providers (APPs- Physician Assistants and Nurse Practitioners) who all work together to provide you with the care you need, when you need it.   You may see any of the following providers on your designated Care Team at your next follow up: Dr Glori Bickers Dr Loralie Champagne Dr. Roxana Hires, NP Lyda Jester, [place] [facility], [place] Forestine Na, NP Audry Riles, PharmD   Please be sure to bring in all your medications bottles to every appointment.    Thank you for choosing [facility]

## 2022-03-28 NOTE — Progress Notes (Signed)
PCP: Dr. Gerarda Fraction Cardiology: Dr. Aundra Dubin   77 y.o. with history of CAD s/p CABG and rectal cancer s/p abdominal perineal resection in 5/14 presents for cardiology followup.  He had CABG x 6 in 2005.  Stress tests later in 2005 and in 2008 were normal.  As above, he had APR in 5/14.  He had a complicated colostomy revision in 12/14.  After this operation, he developed abdominal wall cellulitis and septic shock, also in 12/14.  He was positive for influenza also.  His colostomy will be permanent. He had moved to [facility] but has returned to live in [place].    At a prior appointment, he was noted to be in atrial fibrillation with rate control.  He did not realize that he was in atrial fibrillation.  Holter monitor showed persistent AF.  He looked volume overloaded on exam and Lasix was started.  Echo in 8/17 showed EF 45-50%, diffuse hypokinesis.  He underwent DCCV 9/17 but did not hold NSR.  Lexiscan Cardiolite in 10/17 showed EF 40%, lateral fixed defect, no ischemia.  Echo in 8/18 showed EF 40% with wall motion abnormalities.    Echo in 7/22 showed EF up to 50-55%, mildly decreased RV systolic function.   Follow up 1/23, NYHA II, volume mildly up and instructed to increase Lasix to 40 mg daily.  Echo was done today and reviewed, EF 50% with mild LVH, inferolateral hypokinesis, mildly decreased RV systolic function, PASP 37 mmHg.   Patient returns for followup of CHF.  He has been under a lot of stress, wife broke multiple bones in a fall and he is taking care of her and doing everything around the house.  Not getting as much exercise. Weight up 6 lbs. Eating poorly, high sodium diet. No dyspnea except with heavy activity. No chest pain.  No orthopnea/PND. Was not able to tolerate eplerenone (made him feels "loopy"). He has been taking Lasix 40 mg tid.   ECG (personally reviewed): Atrial fibrillation rate 84, nonspecific T wave flattening  Labs (3/14): LDL 83, HDL 64, TGs 203 Labs (4/14): K 3.4,  creatinine 0.77 Labs (6/14): LDL 63, HDL 41 Labs (5/15): creatinine 0.78 Labs (8/17): LDL 63, HDL 52 Labs (9/17): TSH normal, K 4, creatinine 0.87 Labs (10/17): K 4.2, creatinine 0.77, BNP 108.5 Labs (12/17): HCT 41.6, LDL 68, HDL 50 Labs (6/18): K 3.9, creatinine 1.06 Labs (9/19): hgb 12.1 Labs (6/22): K 4.0, creatinine 1.18, LDL 43 Labs (9/22): K 3.9, creatinine 0.96 Labs (5/23): K 4.3, creatinine 0.89 Labs (8/23): K 3.9, creatinine 1.05   PMH: 1. Rectal cancer: Abdominal perineal resection in 5/14, has permanent colostomy.  2. CAD: CABG in 2005 with LIMA-LAD, SVG-D, seq SVG-OM1/dLCx, seq SVG-PDA/PLV.  Stress myoviews in 11/05 and in 2008 were normal. 3. Hyperlipidemia 4. Atrial fibrillation: First noted 8/17.  Now chronic.  - DCCV 9/17: Converted to NSR but went back into atrial fibrillation before leaving hospital.  5. Chronic systolic CHF: Ischemic cardiomyopathy.  - Echo (8/17) with EF 45-50%, mild LVH, mildly dilated LV.   - Lexiscan Cardiolite (10/17): EF 40%, no ischemia, lateral fixed defect.  - Echo (8/18): EF 40%, anterolateral/inferolateral HK, basal to mid inferior AK, normal RV size with mildly decreased systolic function.  - TEE (12/19): EF 50%, mildly dilated and mildly dysfunctional RV, no RA mass.  - Echo (7/22): EF 50-55%, mildly decreased RV function.  - Echo (2/24): EF 50% with mild LVH, inferolateral hypokinesis, mildly decreased RV systolic function, PASP 37 mmHg.  6. Bradycardia   SH: Lives in [place] with wife, quit smoking 2004, continues moderate ETOH intake, retired from Research officer, trade union and Production manager (Engineer, production).     FH: CAD (brother), CVA (father), CHF (mother).    ROS: All systems were reviewed and negative except as per HPI.   Current Outpatient Medications  Medication Sig Dispense Refill   albuterol (PROVENTIL) (2.5 MG/3ML) 0.083% nebulizer solution Take 2.5 mg by nebulization every 4 (four) hours as needed for wheezing or shortness of breath.      ALPRAZolam (XANAX) 1 MG tablet Take 1 mg by mouth 4 (four) times daily as needed for anxiety or sleep.     Ascorbic Acid (VITAMIN C PO) Take 1,000 mg by mouth daily.     atorvastatin (LIPITOR) 40 MG tablet TAKE 1 TABLET BY MOUTH DAILY 30 tablet 4   carvedilol (COREG) 3.125 MG tablet TAKE ONE TABLET BY MOUTH TWICE DAILY 60 tablet 1   ferrous sulfate 325 (65 FE) MG EC tablet Take 325 mg by mouth 3 (three) times daily with meals.     JARDIANCE 10 MG TABS tablet Take 1 tablet (10 mg total) by mouth daily before breakfast. 90 tablet 3   Lifitegrast (XIIDRA) 5 % SOLN Apply 1 drop to eye in the morning and at bedtime.     losartan (COZAAR) 25 MG tablet Take 1 tablet (25 mg total) by mouth daily. 90 tablet 3   potassium chloride (KLOR-CON) 20 MEQ packet Take 20 mEq by mouth daily.     rivaroxaban (XARELTO) 20 MG TABS tablet Take 1 tablet (20 mg total) by mouth daily with supper. 30 tablet 2   tamsulosin (FLOMAX) 0.4 MG CAPS Take 0.4 mg by mouth daily after breakfast.      torsemide (DEMADEX) 20 MG tablet Take 3 tablets (60 mg total) by mouth daily. 180 tablet 3   eplerenone (INSPRA) 25 MG tablet Take 0.5 tablets (12.5 mg total) by mouth daily. (Patient not taking: Reported on 03/28/2022) 30 tablet 2   No current facility-administered medications for this encounter.   BP (!) 140/89   Pulse 87   Wt 85.3 kg (188 lb)   SpO2 96%   BMI 29.44 kg/m   Wt Readings from Last 3 Encounters:  03/28/22 85.3 kg (188 lb)  09/24/21 82.9 kg (182 lb 12.8 oz)  03/01/21 82 kg (180 lb 12.8 oz)   General: NAD Neck: JVP 10-12 cm, no thyromegaly or thyroid nodule.  Lungs: Clear to auscultation bilaterally with normal respiratory effort. CV: Nondisplaced PMI.  Heart irregular S1/S2, no S3/S4, no murmur.  1+ ankle edema.  No carotid bruit.  Normal pedal pulses.  Abdomen: Soft, nontender, no hepatosplenomegaly, no distention.  Skin: Intact without lesions or rashes.  Neurologic: Alert and oriented x 3.  Psych: Normal  affect. Extremities: No clubbing or cyanosis.  HEENT: Normal.   Assessment/Plan: 1. CAD: S/p CABG.  Cardiolite in 10/17 with prior lateral MI but no ischemia. No CP. - No ASA given stable CAD and Xarelto use.  - Continue statin, check lipids today.  2. Hyperlipidemia: On atorvastatin. Check lipids today.  3. Chronic systolic CHF: Echo in [id number] showed EF 40% with wall motion abnormalities.  Echo in 7/22 with EF up to 50-55%, mild RV dysfunction.  Echo today showed EF 50% with mild LVH, inferolateral hypokinesis, mildly decreased RV systolic function, PASP 37 mmHg.  NYHA class II symptoms. However, he is volume overloaded with increased weight.  Following relatively high sodium diet. He has been taking  Lasix 40 mg tid.  - Off spiro w/ gynecomastia, unable to tolerate eplerenone.  - Stop Lasix, start torsemide 60 mg daily.  BMET/BNP today, BMET in 10 days.  - Continue empagliflozin 10 mg daily. - Continue carvedilol 3.125 mg bid.    - Continue losartan 25 mg daily.   4. Atrial fibrillation:  He does not feel the atrial fibrillation, not sure how long it has been present.  He failed cardioversion in 9/17. He is not a good candidate for antiarrhythmics given baseline long QT and history of CAD.  DCCV x 2 unsuccessful.  Atrial fibrillation will likely be permanent.  - Continue low dose Coreg.  - Continue Xarelto. No bleeding issues. 5. Suspect sleep apnea: Encouraged him in the past to get a sleep study but he is not interested. 6. H/o rectal cancer: s/p LLQ colectomy (5/14).   Followup in 3 wks with APP  Loralie Champagne  03/28/2022

## 2022-03-28 NOTE — Progress Notes (Signed)
Echocardiogram 2D Echocardiogram has been performed.  Cody Coleman 03/28/2022, 9:58 AM

## 2022-04-08 ENCOUNTER — Other Ambulatory Visit (HOSPITAL_COMMUNITY): Payer: Self-pay

## 2022-04-08 ENCOUNTER — Ambulatory Visit (HOSPITAL_COMMUNITY)
Admission: RE | Admit: 2022-04-08 | Discharge: 2022-04-08 | Disposition: A | Discharge status: Home / Self Care | Payer: Medicare Other | Source: Ambulatory Visit | Attending: Internal Medicine | Admitting: Internal Medicine

## 2022-04-08 ENCOUNTER — Telehealth (HOSPITAL_COMMUNITY): Payer: Self-pay

## 2022-04-08 DIAGNOSIS — I5022 Chronic systolic (congestive) heart failure: Secondary | ICD-10-CM | POA: Insufficient documentation

## 2022-04-08 LAB — BASIC METABOLIC PANEL
Anion gap: 13 (ref 5–15)
BUN: 23 mg/dL (ref 8–23)
CO2: 30 mmol/L (ref 22–32)
Calcium: 8.9 mg/dL (ref 8.9–10.3)
Chloride: 96 mmol/L — ABNORMAL LOW (ref 98–111)
Creatinine, Ser: 1.14 mg/dL (ref 0.61–1.24)
GFR, Estimated: >60 mL/min (ref >60)
Glucose, Bld: 91 mg/dL (ref 70–99)
Potassium: 3.1 mmol/L — ABNORMAL LOW (ref 3.5–5.1)
Sodium: 139 mmol/L (ref 135–145)

## 2022-04-08 MED ORDER — POTASSIUM CHLORIDE 20 MEQ PO PACK: 20.0000 meq | PACK | Freq: Every day | ORAL | 3 refills | Status: DC

## 2022-04-08 NOTE — Telephone Encounter (Signed)
Patient aware of labs. He verbalized understanding of medication changes and has appt already scheduled.

## 2022-04-08 NOTE — Telephone Encounter (Signed)
error 

## 2022-04-18 ENCOUNTER — Ambulatory Visit (HOSPITAL_COMMUNITY)
Admission: RE | Admit: 2022-04-18 | Discharge: 2022-04-18 | Disposition: A | Discharge status: Home / Self Care | Payer: Medicare Other | Source: Ambulatory Visit | Attending: Family Medicine | Admitting: Family Medicine

## 2022-04-18 ENCOUNTER — Telehealth (HOSPITAL_COMMUNITY): Payer: Self-pay

## 2022-04-18 ENCOUNTER — Encounter (HOSPITAL_COMMUNITY): Payer: Self-pay

## 2022-04-18 VITALS — BP 110/64 | HR 60 | Wt 181.6 lb

## 2022-04-18 DIAGNOSIS — I2510 Atherosclerotic heart disease of native coronary artery without angina pectoris: Secondary | ICD-10-CM | POA: Diagnosis not present

## 2022-04-18 DIAGNOSIS — G4730 Sleep apnea, unspecified: Secondary | ICD-10-CM

## 2022-04-18 DIAGNOSIS — Z7901 Long term (current) use of anticoagulants: Secondary | ICD-10-CM | POA: Diagnosis not present

## 2022-04-18 DIAGNOSIS — Z951 Presence of aortocoronary bypass graft: Secondary | ICD-10-CM | POA: Diagnosis not present

## 2022-04-18 DIAGNOSIS — Z85048 Personal history of other malignant neoplasm of rectum, rectosigmoid junction, and anus: Secondary | ICD-10-CM | POA: Diagnosis not present

## 2022-04-18 DIAGNOSIS — I5022 Chronic systolic (congestive) heart failure: Secondary | ICD-10-CM | POA: Diagnosis present

## 2022-04-18 DIAGNOSIS — I4891 Unspecified atrial fibrillation: Secondary | ICD-10-CM | POA: Insufficient documentation

## 2022-04-18 DIAGNOSIS — I252 Old myocardial infarction: Secondary | ICD-10-CM | POA: Diagnosis not present

## 2022-04-18 DIAGNOSIS — Z79899 Other long term (current) drug therapy: Secondary | ICD-10-CM | POA: Insufficient documentation

## 2022-04-18 DIAGNOSIS — E785 Hyperlipidemia, unspecified: Secondary | ICD-10-CM | POA: Diagnosis not present

## 2022-04-18 DIAGNOSIS — I4821 Permanent atrial fibrillation: Secondary | ICD-10-CM

## 2022-04-18 DIAGNOSIS — Z7984 Long term (current) use of oral hypoglycemic drugs: Secondary | ICD-10-CM | POA: Diagnosis not present

## 2022-04-18 DIAGNOSIS — Z9049 Acquired absence of other specified parts of digestive tract: Secondary | ICD-10-CM | POA: Diagnosis not present

## 2022-04-18 LAB — CBC
HCT: 39 % (ref 39.0–52.0)
Hemoglobin: 13.4 g/dL (ref 13.0–17.0)
MCH: 34.4 pg — ABNORMAL HIGH (ref 26.0–34.0)
MCHC: 34.4 g/dL (ref 30.0–36.0)
MCV: 100.3 fL — ABNORMAL HIGH (ref 80.0–100.0)
Platelets: 132 10*3/uL — ABNORMAL LOW (ref 150–400)
RBC: 3.89 MIL/uL — ABNORMAL LOW (ref 4.22–5.81)
RDW: 14.3 % (ref 11.5–15.5)
WBC: 6 10*3/uL (ref 4.0–10.5)
nRBC: 0 % (ref 0.0–0.2)

## 2022-04-18 LAB — BASIC METABOLIC PANEL
Anion gap: 10 (ref 5–15)
BUN: 36 mg/dL — ABNORMAL HIGH (ref 8–23)
CO2: 26 mmol/L (ref 22–32)
Calcium: 9.1 mg/dL (ref 8.9–10.3)
Chloride: 102 mmol/L (ref 98–111)
Creatinine, Ser: 1.17 mg/dL (ref 0.61–1.24)
GFR, Estimated: >60 mL/min (ref >60)
Glucose, Bld: 97 mg/dL (ref 70–99)
Potassium: 4.3 mmol/L (ref 3.5–5.1)
Sodium: 138 mmol/L (ref 135–145)

## 2022-04-18 LAB — BRAIN NATRIURETIC PEPTIDE: B Natriuretic Peptide: 266.3 pg/mL — ABNORMAL HIGH (ref 0.0–100.0)

## 2022-04-18 LAB — MAGNESIUM: Magnesium: 2.4 mg/dL (ref 1.7–2.4)

## 2022-04-18 MED ORDER — POTASSIUM CHLORIDE 20 MEQ PO PACK: 40.0000 meq | PACK | Freq: Every day | ORAL | 3 refills | Status: DC

## 2022-04-18 NOTE — Addendum Note (Signed)
Encounter addended by: Stanford Scotland, RN on: 04/18/2022 3:53 PM  Actions taken: Flowsheet accepted, Clinical Note Signed

## 2022-04-18 NOTE — Progress Notes (Signed)
ReDS Vest / Clip - 04/18/22 1500       ReDS Vest / Clip   Station Marker D    Ruler Value 33    ReDS Value Range Moderate volume overload    ReDS Actual Value 37

## 2022-04-18 NOTE — Addendum Note (Signed)
Encounter addended by: Shonna Chock, CMA on: [id number] 2:27 PM  Actions taken: Charge Capture section accepted

## 2022-04-18 NOTE — Progress Notes (Signed)
PCP: Dr. Gerarda Fraction Cardiology: Dr. Aundra Dubin   77 y.o. with history of CAD s/p CABG and rectal cancer s/p abdominal perineal resection in 5/14 presents for cardiology followup.  He had CABG x 6 in 2005.  Stress tests later in 2005 and in 2008 were normal.  As above, he had APR in 5/14.  He had a complicated colostomy revision in 12/14.  After this operation, he developed abdominal wall cellulitis and septic shock, also in 12/14.  He was positive for influenza also.  His colostomy will be permanent. He had moved to [facility] but has returned to live in [place].    At a prior appointment, he was noted to be in atrial fibrillation with rate control.  He did not realize that he was in atrial fibrillation.  Holter monitor showed persistent AF.  He looked volume overloaded on exam and Lasix was started.  Echo in 8/17 showed EF 45-50%, diffuse hypokinesis.  He underwent DCCV 9/17 but did not hold NSR.  Lexiscan Cardiolite in 10/17 showed EF 40%, lateral fixed defect, no ischemia.  Echo in 8/18 showed EF 40% with wall motion abnormalities.    Echo in 7/22 showed EF up to 50-55%, mildly decreased RV systolic function.   Follow up 1/23, NYHA II, volume mildly up and instructed to increase Lasix to 40 mg daily.  Echo 2/24 EF 50% with mild LVH, inferolateral hypokinesis, mildly decreased RV systolic function, PASP 37 mmHg.   Follow up 2/24 he was volume overloaded. Lasix stopped and torsemide started.   Today he returns for HF follow up with his wife. Overall feeling poorly. He has felt terrible since starting his torsemide. Has HA, chest pressure, belching, shakes, dizziness, weak, unstable balance, and he does not feel safe to drive. He is not SOB walking on flat ground or walking up steps. Denies palpitations, abnormal bleeding, edema, or PND/Orthopnea. Appetite ok. No fever or chills. He is not weighing at home. Taking all medications.   ECG (personally reviewed): none ordered today.  REDs: 37%  Labs (9/19):  hgb 12.1 Labs (6/22): K 4.0, creatinine 1.18, LDL 43 Labs (9/22): K 3.9, creatinine 0.96 Labs (5/23): K 4.3, creatinine 0.89 Labs (8/23): K 3.9, creatinine 1.05 Labs (2/24): K 4.1, creatinine 0.97, LDL 61   PMH: 1. Rectal cancer: Abdominal perineal resection in 5/14, has permanent colostomy.  2. CAD: CABG in 2005 with LIMA-LAD, SVG-D, seq SVG-OM1/dLCx, seq SVG-PDA/PLV.  Stress myoviews in 11/05 and in 2008 were normal. 3. Hyperlipidemia 4. Atrial fibrillation: First noted 8/17.  Now chronic.  - DCCV 9/17: Converted to NSR but went back into atrial fibrillation before leaving hospital.  5. Chronic systolic CHF: Ischemic cardiomyopathy.  - Echo (8/17) with EF 45-50%, mild LVH, mildly dilated LV.   - Lexiscan Cardiolite (10/17): EF 40%, no ischemia, lateral fixed defect.  - Echo (8/18): EF 40%, anterolateral/inferolateral HK, basal to mid inferior AK, normal RV size with mildly decreased systolic function.  - TEE (12/19): EF 50%, mildly dilated and mildly dysfunctional RV, no RA mass.  - Echo (7/22): EF 50-55%, mildly decreased RV function.  - Echo (2/24): EF 50% with mild LVH, inferolateral hypokinesis, mildly decreased RV systolic function, PASP 37 mmHg.  6. 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     FH: CAD (brother), CVA (father), CHF (mother).    ROS: All systems were reviewed and negative except as per HPI.   Current  Outpatient Medications  Medication Sig Dispense Refill   albuterol (PROVENTIL) (2.5 MG/3ML) 0.083% nebulizer solution Take 2.5 mg by nebulization every 4 (four) hours as needed for wheezing or shortness of breath.     ALPRAZolam (XANAX) 1 MG tablet Take 1 mg by mouth 4 (four) times daily as needed for anxiety or sleep.     Ascorbic Acid (VITAMIN C PO) Take 1,000 mg by mouth daily.     atorvastatin (LIPITOR) 40 MG tablet TAKE 1 TABLET BY MOUTH DAILY 30 tablet 4    carvedilol (COREG) 3.125 MG tablet TAKE ONE TABLET BY MOUTH TWICE DAILY 60 tablet 1   ferrous sulfate 325 (65 FE) MG EC tablet Take 325 mg by mouth 3 (three) times daily with meals.     JARDIANCE 10 MG TABS tablet Take 1 tablet (10 mg total) by mouth daily before breakfast. 90 tablet 3   Lifitegrast (XIIDRA) 5 % SOLN Apply 1 drop to eye in the morning and at bedtime.     losartan (COZAAR) 25 MG tablet Take 1 tablet (25 mg total) by mouth daily. 90 tablet 3   potassium chloride (KLOR-CON) 20 MEQ packet Take 20 mEq by mouth daily. (Patient taking differently: Take 40 mEq by mouth daily.) 90 packet 3   rivaroxaban (XARELTO) 20 MG TABS tablet Take 1 tablet (20 mg total) by mouth daily with supper. 30 tablet 2   tamsulosin (FLOMAX) 0.4 MG CAPS Take 0.4 mg by mouth daily after breakfast.      torsemide (DEMADEX) 20 MG tablet Take 3 tablets (60 mg total) by mouth daily. 180 tablet 3   No current facility-administered medications for this encounter.   BP 110/64   Pulse 60   Wt 82.4 kg (181 lb 9.6 oz)   SpO2 95%   BMI 28.44 kg/m   Wt Readings from Last 3 Encounters:  04/18/22 82.4 kg (181 lb 9.6 oz)  03/28/22 85.3 kg (188 lb)  09/24/21 82.9 kg (182 lb 12.8 oz)   Physical Exam General:  NAD. No resp difficulty, walked into clinic HEENT: Normal Neck: Supple. No JVD. Carotids 2+ bilat; no bruits. No lymphadenopathy or thryomegaly appreciated. Cor: PMI nondisplaced. Irregular rate & rhythm. No rubs, gallops or murmurs. Lungs: Clear Abdomen: Soft, nontender, nondistended. No hepatosplenomegaly. No bruits or masses. Good bowel sounds. Extremities: No cyanosis, clubbing, rash, edema Neuro: Alert & oriented x 3, cranial nerves grossly intact. Moves all 4 extremities w/o difficulty. Affect pleasant.  Assessment/Plan: 1. CAD: S/p CABG.  Cardiolite in 10/17 with prior lateral MI but no ischemia. No ischemic CP. - No ASA given stable CAD and Xarelto use.  - Continue statin, good lipids 2/24. 2.  Hyperlipidemia: On atorvastatin. Good lipids 2/24. 3. Chronic systolic CHF: Echo in [id number] showed EF 40% with wall motion abnormalities.  Echo in 7/22 with EF up to 50-55%, mild RV dysfunction.  Echo 2/24 EF 50% with mild LVH, inferolateral hypokinesis, mildly decreased RV systolic function, PASP 37 mmHg.  NYHA class II symptoms. He is not volume overloaded today, ReDs 37% - Continue torsemide 60 mg daily, can try splitting dose into 40 qam/20 q pm to see if symptoms improve. BMET and BNP today. - Continue empagliflozin 10 mg daily. - Continue carvedilol 3.125 mg bid.    - Continue losartan 25 mg daily.   - Off spiro w/ gynecomastia, unable to tolerate eplerenone.  4. Atrial fibrillation:  He does not feel the atrial fibrillation, not sure how long it has been present.  He  failed cardioversion in 9/17. He is not a good candidate for antiarrhythmics given baseline long QT and history of CAD.  DCCV x 2 unsuccessful.  Atrial fibrillation will likely be permanent.  - Continue low dose Coreg.  - Continue Xarelto. No bleeding issues. CBC today. 5. Suspect sleep apnea: Encouraged him in the past to get a sleep study but he is not interested. 6. H/o rectal cancer: s/p LLQ colectomy (5/14).   Follow up in 6 months with Dr. Wynema Birch [facility] FNP-BC 04/18/2022

## 2022-04-18 NOTE — Patient Instructions (Signed)
Labs done today. We will contact you only if your labs are abnormal.  No medication changes were made. Please continue all current medications as prescribed.  Your physician recommends that you schedule a follow-up appointment in: 6 months with Dr. Aundra Coleman. Please contact our office in August to schedule a September appointment.   If you have any questions or concerns before your next appointment please send [place] a message through [place] or call our office at [phone].    TO LEAVE A MESSAGE FOR THE NURSE SELECT OPTION 2, PLEASE LEAVE A MESSAGE INCLUDING: YOUR NAME DATE OF BIRTH CALL BACK NUMBER REASON FOR CALL**this is important as we prioritize the call backs  YOU WILL RECEIVE A CALL BACK THE SAME DAY AS LONG AS YOU CALL BEFORE 4:00 PM   Do the following things EVERYDAY: Weigh yourself in the morning before breakfast. Write it down and keep it in a log. Take your medicines as prescribed Eat low salt foods--Limit salt (sodium) to 2000 mg per day.  Stay as active as you can everyday Limit all fluids for the day to less than 2 liters   At the [facility], you and your health needs are our priority. As part of our continuing mission to provide you with exceptional heart care, we have created designated Provider Care Teams. These Care Teams include your primary Cardiologist (physician) and Advanced Practice Providers (APPs- Physician Assistants and Nurse Practitioners) who all work together to provide you with the care you need, when you need it.   You may see any of the following providers on your designated Care Team at your next follow up: Dr Glori Bickers Dr Haynes Kerns, NP Lyda Jester, [place] Audry Riles, PharmD   Please be sure to bring in all your medications bottles to every appointment.

## 2022-04-18 NOTE — Telephone Encounter (Signed)
Patient aware of recent labs

## 2022-04-25 ENCOUNTER — Other Ambulatory Visit (HOSPITAL_COMMUNITY): Payer: Self-pay | Admitting: Cardiology

## 2022-05-04 ENCOUNTER — Telehealth (HOSPITAL_COMMUNITY): Payer: Self-pay

## 2022-05-04 NOTE — Telephone Encounter (Signed)
I spoke with patient and went over decrease in torsemide. He verbalized understanding.

## 2022-05-04 NOTE — Telephone Encounter (Signed)
He thinks he is experiencing side effects of Torsemide  He recently fell with no injuries.  He is always dizzy, has trouble with balance, is afraid to drive or work in the yard at all. He felt like his limbs went numb when he fell. The fall happened on Saturday. He states when he takes the Torsemide, he starts to have these symptoms. Please advise

## 2022-05-11 ENCOUNTER — Encounter (HOSPITAL_COMMUNITY): Payer: Medicare Other

## 2022-05-23 ENCOUNTER — Other Ambulatory Visit (HOSPITAL_COMMUNITY): Payer: Self-pay | Admitting: Family Medicine

## 2022-08-01 ENCOUNTER — Other Ambulatory Visit (HOSPITAL_COMMUNITY): Payer: Self-pay | Admitting: Internal Medicine

## 2022-08-01 DIAGNOSIS — R7989 Other specified abnormal findings of blood chemistry: Secondary | ICD-10-CM

## 2022-08-12 ENCOUNTER — Ambulatory Visit (HOSPITAL_COMMUNITY)
Admission: RE | Admit: 2022-08-12 | Discharge: 2022-08-12 | Disposition: A | Discharge status: Home / Self Care | Payer: Medicare Other | Source: Ambulatory Visit | Attending: Internal Medicine | Admitting: Internal Medicine

## 2022-08-12 DIAGNOSIS — R7989 Other specified abnormal findings of blood chemistry: Secondary | ICD-10-CM | POA: Insufficient documentation

## 2022-08-15 ENCOUNTER — Other Ambulatory Visit: Payer: Self-pay | Admitting: *Deleted

## 2022-08-15 DIAGNOSIS — K8021 Calculus of gallbladder without cholecystitis with obstruction: Secondary | ICD-10-CM

## 2022-08-16 ENCOUNTER — Ambulatory Visit: Payer: Medicare Other | Admitting: General Surgery

## 2022-08-16 ENCOUNTER — Encounter: Payer: Self-pay | Admitting: General Surgery

## 2022-08-16 VITALS — BP 120/61 | HR 52 | Temp 98.1°F | Resp 12 | Ht 67.0 in | Wt 176.0 lb

## 2022-08-16 DIAGNOSIS — K8020 Calculus of gallbladder without cholecystitis without obstruction: Secondary | ICD-10-CM | POA: Diagnosis not present

## 2022-08-16 NOTE — Progress Notes (Signed)
Cody Coleman; [id number]; 1945/04/27   HPI Patient is a 77 year old white male who was referred to my care by Dr. Sherwood Gambler for evaluation and treatment of cholelithiasis.  Patient had several episodes of sharp abdominal pain which occurred both on the right side of his abdomen and left side of his abdomen in the past.  The episodes would last only approximately a minute and would subside on their own.  He was noted on liver test to have slightly elevated AST and AST.  Patient states they were a couple points above normal.  No other findings were noted.  He denies any significant right upper quadrant abdominal pain with radiation to his right flank, bloating, nausea, vomiting, or fatty food intolerance.  He denies any jaundice.  He did have an ultrasound which showed cholelithiasis but no evidence of cholecystitis.  His common bile duct was within normal limits.  Incidentally, he was also found to have a fatty liver. Past Medical History:  Diagnosis Date   Atrial fibrillation (HCC) 12/15/2016   BPH (benign prostatic hyperplasia)    CAD (coronary artery disease)    Cancer (HCC)    RECTAL CANCER--SOME RECTAL BLEEDING   COPD (chronic obstructive pulmonary disease) (HCC)    Emphysema lung (HCC)    Former tobacco use    High cholesterol    MI (myocardial infarction) (HCC)    x 2  AT AGE 49 AND AT AGE 49  DR. MCLEAN IS PT'S MEDICAL DOCTOR   Pain    RIGHT KNEE PAIN AND OCCAS OTHER JOINT PAINS   Sleep difficulties    NEVER SLEEPS WELL - WAKES UP AFTER SEVERAL HOURS AND NOT ABLE TO GO BACK TO SLEEP--DID SLEEP STUDY AND TOLD HE DID NOT HAVE SLEEP APNEA   Urgency-frequency syndrome    FLOMAX HAS HELPED    Past Surgical History:  Procedure Laterality Date   CARDIOVERSION N/A 10/21/2015   Procedure: CARDIOVERSION;  Surgeon: Laurey Morale, MD;  Location: [facility] ENDOSCOPY;  Service: Cardiovascular;  Laterality: N/A;   COLONOSCOPY N/A 04/20/2012   Procedure: COLONOSCOPY;  Surgeon: Malissa Hippo, MD;   Location: [facility] ENDO SUITE;  Service: Endoscopy;  Laterality: N/A;  225   COLONOSCOPY N/A 06/14/2013   Procedure: COLONOSCOPY;  Surgeon: Malissa Hippo, MD;  Location: [facility] ENDO SUITE;  Service: Endoscopy;  Laterality: N/A;  1245   COLOSTOMY REVISION N/A 01/17/2013   Procedure: COLOSTOMY REVISION;  Surgeon: Romie Levee, MD;  Location: [facility] OR;  Service: General;  Laterality: N/A;   CORONARY ANGIOPLASTY WITH STENT PLACEMENT     before bypass   CORONARY ARTERY BYPASS GRAFT     2005   EUS N/A 05/03/2012   Procedure: LOWER ENDOSCOPIC ULTRASOUND (EUS);  Surgeon: Rachael Fee, MD;  Location: [facility] ENDOSCOPY;  Service: Endoscopy;  Laterality: N/A;   INSERTION OF MESH N/A 10/25/2013   Procedure: INSERTION OF STRATTICE MESH;  Surgeon: Romie Levee, MD;  Location: [facility] ORS;  Service: General;  Laterality: N/A;   LAPAROSCOPIC ASSISTED ABDOMINAL PERINEAL RESECTION N/A 06/11/2012   Procedure: LAPAROSCOPIC ASSISTED ABDOMINAL PERINEAL RESECTION;  Surgeon: Romie Levee, MD;  Location: [facility] ORS;  Service: General;  Laterality: N/A;   LAPAROSCOPIC PARASTOMAL HERNIA N/A 10/25/2013   Procedure: ROBOTIC PARASTOMAL HERNIA REPAIR WITH STRATTICE MESH;  Surgeon: Romie Levee, MD;  Location: [facility] ORS;  Service: General;  Laterality: N/A;   PARASTOMAL HERNIA REPAIR N/A 12/14/2016   Procedure: PREPARE OF RECURRENT PERISTOMAL HERNIA WITH MESH WITH LAPAROSCOPIC LYSIS OF ADHESIONS, AND INCISIONAL HERNIA REPAIR  WITH MESH;  Surgeon: Karie Soda, MD;  Location: [facility] ORS;  Service: General;  Laterality: N/A;   TEE WITHOUT CARDIOVERSION N/A 01/17/2018   Procedure: TRANSESOPHAGEAL ECHOCARDIOGRAM (TEE);  Surgeon: Laurey Morale, MD;  Location: [facility] ENDOSCOPY;  Service: Cardiovascular;  Laterality: N/A;   TONSILLECTOMY      Family History  Problem Relation Age of Onset   Congestive Heart Failure Mother    Stroke Father    Coronary artery disease Father    Coronary artery disease Brother    Colon cancer Neg Hx     Current Outpatient  Medications on File Prior to Visit  Medication Sig Dispense Refill   ALPRAZolam (XANAX) 1 MG tablet Take 1 mg by mouth 4 (four) times daily as needed for anxiety or sleep.     atorvastatin (LIPITOR) 40 MG tablet TAKE 1 TABLET BY MOUTH DAILY 30 tablet 4   carvedilol (COREG) 3.125 MG tablet TAKE ONE TABLET BY MOUTH TWICE DAILY 60 tablet 1   ferrous sulfate 325 (65 FE) MG EC tablet Take 325 mg by mouth 3 (three) times daily with meals.     furosemide (LASIX) 40 MG tablet Take 40 mg by mouth daily.     JARDIANCE 10 MG TABS tablet Take 1 tablet (10 mg total) by mouth daily before breakfast. 90 tablet 3   Lifitegrast (XIIDRA) 5 % SOLN Apply 1 drop to eye in the morning and at bedtime.     losartan (COZAAR) 25 MG tablet Take 1 tablet (25 mg total) by mouth daily. 90 tablet 3   potassium chloride SA (KLOR-CON M) 20 MEQ tablet Take 1 tablet (20 mEq total) by mouth daily as needed (only when taking furosemide). 30 tablet 3   rivaroxaban (XARELTO) 20 MG TABS tablet Take 1 tablet (20 mg total) by mouth daily with supper. 30 tablet 2   tamsulosin (FLOMAX) 0.4 MG CAPS Take 0.4 mg by mouth daily after breakfast.      zolpidem (AMBIEN) 10 MG tablet Take 10 mg by mouth daily.     albuterol (PROVENTIL) (2.5 MG/3ML) 0.083% nebulizer solution Take 2.5 mg by nebulization every 4 (four) hours as needed for wheezing or shortness of breath.     Ascorbic Acid (VITAMIN C PO) Take 1,000 mg by mouth daily.     potassium chloride (KLOR-CON) 20 MEQ packet Take 40 mEq by mouth daily. (Patient not taking: Reported on 08/16/2022) 180 packet 3   No current facility-administered medications on file prior to visit.    Allergies  Allergen Reactions   Nsaids Other (See Comments)    No NSAIDs while patient is on Xerelto anticoagulation    Social History   Substance and Sexual Activity  Alcohol Use Yes   Comment: rarely    Social History   Tobacco Use  Smoking Status Former   Current packs/day: 0.00   Average  packs/day: 1 pack/day for 30.0 years (30.0 ttl pk-yrs)   Types: Cigarettes   Start date: 02/01/1972   Quit date: 01/31/2002   Years since quitting: 20.5  Smokeless Tobacco Never    Review of Systems  Constitutional:  Positive for malaise/fatigue.  HENT:  Positive for sinus pain.   Eyes: Negative.   Respiratory:  Positive for cough and wheezing.   Cardiovascular: Negative.   Gastrointestinal:  Positive for abdominal pain and heartburn.  Genitourinary:  Positive for frequency.  Musculoskeletal:  Positive for back pain.  Skin: Negative.   Neurological: Negative.   Endo/Heme/Allergies:  Bruises/bleeds easily.  Psychiatric/Behavioral: Negative.  Objective   Vitals:   08/16/22 1102  BP: 120/61  Pulse: (!) 52  Resp: 12  Temp: 98.1 F (36.7 C)  SpO2: 98%    Physical Exam Vitals reviewed.  Constitutional:      Appearance: Normal appearance. He is normal weight. He is not ill-appearing.  HENT:     Head: Normocephalic and atraumatic.  Eyes:     General: No scleral icterus. Cardiovascular:     Rate and Rhythm: Normal rate. Rhythm irregular.     Heart sounds: Normal heart sounds. No murmur heard.    No friction rub. No gallop.  Pulmonary:     Effort: Pulmonary effort is normal. No respiratory distress.     Breath sounds: Normal breath sounds. No stridor. No wheezing, rhonchi or rales.  Abdominal:     General: Bowel sounds are normal. There is no distension.     Palpations: Abdomen is soft. There is no mass.     Tenderness: There is no abdominal tenderness. There is no guarding or rebound.     Hernia: No hernia is present.     Comments: Multiple healed surgical scars are present.  Left colostomy in place.  No right upper quadrant abdominal pain elicited.  Difficult to appreciate the liver edge.  Skin:    General: Skin is warm and dry.  Neurological:     Mental Status: He is alert and oriented to person, place, and time.    Ultrasound report with viewed Assessment   Cholelithiasis, fatty liver.  Patient does not describe any symptoms of biliary colic.  I suspect his abdominal pain was secondary to muscular strain.  He has had multiple abdominal surgeries including mesh placement for a parastomal hernia x 2.  A laparoscopic approach may be difficult due to his multiple previous abdominal surgeries.  He has multiple comorbidities which also put him at increased risk for general anesthesia.  His slight transaminitis may be secondary to fatty liver and/or medications. I told the patient that the risks outweighed the benefits of any cholecystectomy at the present time given his lack of biliary colic.  He and his wife agree. Plan  The signs and symptoms of gallbladder disease were explained to the patient.  Should he have any development of those symptoms, he was instructed to return to my care for further evaluation and treatment.

## 2023-03-21 ENCOUNTER — Emergency Department (HOSPITAL_COMMUNITY): Payer: Medicare Other

## 2023-03-21 ENCOUNTER — Emergency Department (HOSPITAL_COMMUNITY)
Admission: EM | Admit: 2023-03-21 | Discharge: 2023-03-21 | Disposition: A | Discharge status: Home / Self Care | Payer: Medicare Other | Attending: Emergency Medicine | Admitting: Emergency Medicine

## 2023-03-21 ENCOUNTER — Other Ambulatory Visit: Payer: Self-pay

## 2023-03-21 DIAGNOSIS — I672 Cerebral atherosclerosis: Secondary | ICD-10-CM | POA: Diagnosis not present

## 2023-03-21 DIAGNOSIS — R0602 Shortness of breath: Secondary | ICD-10-CM | POA: Diagnosis not present

## 2023-03-21 DIAGNOSIS — Z7901 Long term (current) use of anticoagulants: Secondary | ICD-10-CM | POA: Diagnosis not present

## 2023-03-21 DIAGNOSIS — F1010 Alcohol abuse, uncomplicated: Secondary | ICD-10-CM | POA: Insufficient documentation

## 2023-03-21 DIAGNOSIS — J320 Chronic maxillary sinusitis: Secondary | ICD-10-CM | POA: Diagnosis not present

## 2023-03-21 DIAGNOSIS — I700 Atherosclerosis of aorta: Secondary | ICD-10-CM | POA: Diagnosis not present

## 2023-03-21 DIAGNOSIS — Z951 Presence of aortocoronary bypass graft: Secondary | ICD-10-CM | POA: Diagnosis not present

## 2023-03-21 DIAGNOSIS — R079 Chest pain, unspecified: Secondary | ICD-10-CM | POA: Insufficient documentation

## 2023-03-21 DIAGNOSIS — I4819 Other persistent atrial fibrillation: Secondary | ICD-10-CM | POA: Insufficient documentation

## 2023-03-21 DIAGNOSIS — Y900 Blood alcohol level of less than 20 mg/100 ml: Secondary | ICD-10-CM | POA: Diagnosis not present

## 2023-03-21 DIAGNOSIS — Z85048 Personal history of other malignant neoplasm of rectum, rectosigmoid junction, and anus: Secondary | ICD-10-CM | POA: Diagnosis not present

## 2023-03-21 DIAGNOSIS — R42 Dizziness and giddiness: Secondary | ICD-10-CM | POA: Diagnosis present

## 2023-03-21 DIAGNOSIS — J449 Chronic obstructive pulmonary disease, unspecified: Secondary | ICD-10-CM | POA: Diagnosis not present

## 2023-03-21 DIAGNOSIS — I2510 Atherosclerotic heart disease of native coronary artery without angina pectoris: Secondary | ICD-10-CM | POA: Insufficient documentation

## 2023-03-21 DIAGNOSIS — I509 Heart failure, unspecified: Secondary | ICD-10-CM | POA: Diagnosis not present

## 2023-03-21 LAB — CBC WITH DIFFERENTIAL/PLATELET
Abs Immature Granulocytes: 0.03 10*3/uL (ref 0.00–0.07)
Basophils Absolute: 0 10*3/uL (ref 0.0–0.1)
Basophils Relative: 1 %
Eosinophils Absolute: 0.3 10*3/uL (ref 0.0–0.5)
Eosinophils Relative: 5 %
HCT: 42.1 % (ref 39.0–52.0)
Hemoglobin: 13.9 g/dL (ref 13.0–17.0)
Immature Granulocytes: 0 %
Lymphocytes Relative: 13 %
Lymphs Abs: 0.9 10*3/uL (ref 0.7–4.0)
MCH: 32.9 pg (ref 26.0–34.0)
MCHC: 33 g/dL (ref 30.0–36.0)
MCV: 99.5 fL (ref 80.0–100.0)
Monocytes Absolute: 0.6 10*3/uL (ref 0.1–1.0)
Monocytes Relative: 9 %
Neutro Abs: 5 10*3/uL (ref 1.7–7.7)
Neutrophils Relative %: 72 %
Platelets: 143 10*3/uL — ABNORMAL LOW (ref 150–400)
RBC: 4.23 MIL/uL (ref 4.22–5.81)
RDW: 14.6 % (ref 11.5–15.5)
WBC: 6.8 10*3/uL (ref 4.0–10.5)
nRBC: 0 % (ref 0.0–0.2)

## 2023-03-21 LAB — COMPREHENSIVE METABOLIC PANEL
ALT: 36 U/L (ref 0–44)
AST: 47 U/L — ABNORMAL HIGH (ref 15–41)
Albumin: 4.8 g/dL (ref 3.5–5.0)
Alkaline Phosphatase: 54 U/L (ref 38–126)
Anion gap: 14 (ref 5–15)
BUN: 30 mg/dL — ABNORMAL HIGH (ref 8–23)
CO2: 27 mmol/L (ref 22–32)
Calcium: 9.6 mg/dL (ref 8.9–10.3)
Chloride: 101 mmol/L (ref 98–111)
Creatinine, Ser: 1.17 mg/dL (ref 0.61–1.24)
GFR, Estimated: >60 mL/min (ref >60)
Glucose, Bld: 104 mg/dL — ABNORMAL HIGH (ref 70–99)
Potassium: 3.9 mmol/L (ref 3.5–5.1)
Sodium: 142 mmol/L (ref 135–145)
Total Bilirubin: 1 mg/dL (ref 0.0–1.2)
Total Protein: 8.1 g/dL (ref 6.5–8.1)

## 2023-03-21 LAB — MAGNESIUM: Magnesium: 2 mg/dL (ref 1.7–2.4)

## 2023-03-21 LAB — URINALYSIS, ROUTINE W REFLEX MICROSCOPIC
Bacteria, UA: NONE SEEN
Bilirubin Urine: NEGATIVE
Glucose, UA: >=500 mg/dL — AB
Ketones, ur: NEGATIVE mg/dL
Leukocytes,Ua: NEGATIVE
Nitrite: NEGATIVE
Protein, ur: NEGATIVE mg/dL
Specific Gravity, Urine: 1.003 — ABNORMAL LOW (ref 1.005–1.030)
pH: 6 (ref 5.0–8.0)

## 2023-03-21 LAB — RESP PANEL BY RT-PCR (RSV, FLU A&B, COVID)  RVPGX2
Influenza A by PCR: NEGATIVE
Influenza B by PCR: NEGATIVE
Resp Syncytial Virus by PCR: NEGATIVE
SARS Coronavirus 2 by RT PCR: NEGATIVE

## 2023-03-21 LAB — BRAIN NATRIURETIC PEPTIDE: B Natriuretic Peptide: 197 pg/mL — ABNORMAL HIGH (ref 0.0–100.0)

## 2023-03-21 LAB — TROPONIN I (HIGH SENSITIVITY)
Troponin I (High Sensitivity): 6 ng/L (ref <18)
Troponin I (High Sensitivity): 7 ng/L (ref <18)

## 2023-03-21 LAB — ETHANOL: Alcohol, Ethyl (B): 10 mg/dL — ABNORMAL HIGH (ref <10)

## 2023-03-21 MED ORDER — MECLIZINE HCL 25 MG PO TABS: 25.0000 mg | ORAL_TABLET | Freq: Three times a day (TID) | ORAL | 0 refills | Status: DC | PRN

## 2023-03-21 MED ORDER — CHLORDIAZEPOXIDE HCL 25 MG PO CAPS: ORAL_CAPSULE | ORAL | 0 refills | Status: DC

## 2023-03-21 MED ORDER — LORAZEPAM 2 MG/ML IJ SOLN
0.5000 mg | Freq: Once | INTRAMUSCULAR | Status: AC
  Administered 2023-03-21: 0.5 mg via INTRAVENOUS
  Filled 2023-03-21: qty 1

## 2023-03-21 MED ORDER — MECLIZINE HCL 12.5 MG PO TABS
25.0000 mg | ORAL_TABLET | Freq: Once | ORAL | Status: AC
  Administered 2023-03-21: 25 mg via ORAL
  Filled 2023-03-21: qty 2

## 2023-03-21 NOTE — ED Notes (Signed)
Pt ambulated in room w no complaints of weakness or dizziness. Pt said " I feel fine"

## 2023-03-21 NOTE — ED Notes (Signed)
Urinal given to patient.

## 2023-03-21 NOTE — ED Triage Notes (Signed)
Pt arrived via [facility] from home c/o dizziness. Pt denies falling or any syncopal episodes. Pt endorses chest discomfort earlier which has now resolved.

## 2023-03-21 NOTE — ED Provider Notes (Signed)
[place] [facility] AT [facility] Provider Note   CSN: [id number] Arrival date & time: 03/21/23  1430     History  Chief Complaint  Patient presents with   Dizziness    Cody Coleman is a 78 y.o. male.  Pt is a 78 yo male with pmhx significant for persistent afib (on Xarelto), cad, high cholesterol, bph, copd, rectal cancer, and chf.  Pt woke up today feeling very dizzy.  He said he got up to use the bathroom and felt very dizzy.  "Like aquarium water was swishing in his head."  Pt also developed some upper cp and sob.  He used a neb and sob felt better.  Pt said he's been fighting a sinus infection for a few weeks.  Pt's wife said he's been drinking a lot lately.  He has drank a large bottle of alcohol since Thursday (2/13).  He drinks with every meal.  He has been drinking for several years, but wife feels it's getting worse.        Home Medications Prior to Admission medications   Medication Sig Start Date End Date Taking? Authorizing Provider  acetaminophen (TYLENOL) 500 MG tablet Take 1,000 mg by mouth every 6 (six) hours as needed for mild pain (pain score 1-3).   Yes [provider]  albuterol (PROVENTIL) (2.5 MG/3ML) 0.083% nebulizer solution Take 2.5 mg by nebulization every 4 (four) hours as needed for wheezing or shortness of breath. 06/18/20  Yes [provider]  ALPRAZolam Prudy Feeler) 1 MG tablet Take 0.5-1 mg by mouth 4 (four) times daily as needed for anxiety or sleep.   Yes [provider]  atorvastatin (LIPITOR) 40 MG tablet TAKE 1 TABLET BY MOUTH DAILY 06/05/14  Yes Laurey Morale, MD  carvedilol (COREG) 3.125 MG tablet TAKE ONE TABLET BY MOUTH TWICE DAILY 08/28/17  Yes Laurey Morale, MD  chlordiazePOXIDE (LIBRIUM) 25 MG capsule 50mg  PO TID x 1D, then 25-50mg  PO BID X 1D, then 25-50mg  PO QD X 1D 03/21/23  Yes Jacalyn Lefevre, MD  Cholecalciferol (VITAMIN D) 50 MCG (2000 UT) CAPS Take 1 capsule by mouth daily.   Yes  [provider]  furosemide (LASIX) 40 MG tablet Take 40 mg by mouth 4 (four) times daily as needed for fluid or edema. 06/17/22  Yes [provider]  JARDIANCE 10 MG TABS tablet Take 1 tablet (10 mg total) by mouth daily before breakfast. 05/23/22  Yes Laurey Morale, MD  losartan (COZAAR) 25 MG tablet Take 1 tablet (25 mg total) by mouth daily. 03/01/21  Yes Laurey Morale, MD  meclizine (ANTIVERT) 25 MG tablet Take 1 tablet (25 mg total) by mouth 3 (three) times daily as needed for dizziness. 03/21/23  Yes Jacalyn Lefevre, MD  potassium chloride SA (KLOR-CON M) 20 MEQ tablet Take 1 tablet (20 mEq total) by mouth daily as needed (only when taking furosemide). Patient taking differently: Take 20 mEq by mouth at bedtime. 04/25/22  Yes [place], Anderson Malta, FNP  rivaroxaban (XARELTO) 20 MG TABS tablet Take 1 tablet (20 mg total) by mouth daily with supper. 11/10/17  Yes Bensimhon, Bevelyn Buckles, MD  tamsulosin (FLOMAX) 0.4 MG CAPS Take 0.4 mg by mouth daily after breakfast.    Yes [provider]      Allergies    Nsaids    Review of Systems   Review of Systems  Respiratory:  Positive for shortness of breath.   Cardiovascular:  Positive for chest  pain.  Neurological:  Positive for dizziness.  All other systems reviewed and are negative.   Physical Exam Updated Vital Signs BP (!) 139/91   Pulse 72   Temp 97.9 F (36.6 C) (Oral)   Resp (!) 24   Ht 5\' 7"  (1.702 m)   Wt 84.4 kg   SpO2 93%   BMI 29.13 kg/m  Physical Exam Vitals and nursing note reviewed.  Constitutional:      Appearance: Normal appearance.  HENT:     Head: Normocephalic and atraumatic.     Right Ear: External ear normal.     Left Ear: External ear normal.     Nose: Nose normal.     Mouth/Throat:     Mouth: Mucous membranes are moist.     Pharynx: Oropharynx is clear.  Eyes:     Extraocular Movements: Extraocular movements intact.     Conjunctiva/sclera: Conjunctivae normal.     Pupils:  Pupils are equal, round, and reactive to light.  Cardiovascular:     Rate and Rhythm: Normal rate. Rhythm irregular.     Pulses: Normal pulses.     Heart sounds: Normal heart sounds.  Pulmonary:     Effort: Pulmonary effort is normal.     Breath sounds: Normal breath sounds.  Abdominal:     General: Abdomen is flat. Bowel sounds are normal.     Palpations: Abdomen is soft.  Musculoskeletal:        General: Normal range of motion.     Cervical back: Normal range of motion and neck supple.  Skin:    General: Skin is warm.     Capillary Refill: Capillary refill takes less than 2 seconds.  Neurological:     General: No focal deficit present.     Mental Status: He is alert and oriented to person, place, and time.  Psychiatric:        Mood and Affect: Mood normal.        Behavior: Behavior normal.     ED Results / Procedures / Treatments   Labs (all labs ordered are listed, but only abnormal results are displayed) Labs Reviewed  URINALYSIS, ROUTINE W REFLEX MICROSCOPIC - Abnormal; Notable for the following components:      Result Value   Color, Urine COLORLESS (*)    Specific Gravity, Urine 1.003 (*)    Glucose, UA >=500 (*)    Hgb urine dipstick SMALL (*)    All other components within normal limits  CBC WITH DIFFERENTIAL/PLATELET - Abnormal; Notable for the following components:   Platelets 143 (*)    All other components within normal limits  COMPREHENSIVE METABOLIC PANEL - Abnormal; Notable for the following components:   Glucose, Bld 104 (*)    BUN 30 (*)    AST 47 (*)    All other components within normal limits  BRAIN NATRIURETIC PEPTIDE - Abnormal; Notable for the following components:   B Natriuretic Peptide 197.0 (*)    All other components within normal limits  ETHANOL - Abnormal; Notable for the following components:   Alcohol, Ethyl (B) 10 (*)    All other components within normal limits  RESP PANEL BY RT-PCR (RSV, FLU A&B, COVID)  RVPGX2  MAGNESIUM   TROPONIN I (HIGH SENSITIVITY)  TROPONIN I (HIGH SENSITIVITY)    EKG EKG Interpretation Date/Time:  Tuesday March 21 2023 14:54:55 EST Ventricular Rate:  72 PR Interval:    QRS Duration:  106 QT Interval:  434 QTC Calculation: 475 R Axis:  83  Text Interpretation: Atrial fibrillation Nonspecific T wave abnormality Abnormal ECG When compared with ECG of 28-Mar-2022 10:19, Incomplete left bundle branch block is no longer Present No significant change since last tracing Confirmed by Jacalyn Lefevre [phone]) on 03/21/2023 3:03:46 PM  Radiology MR BRAIN WO CONTRAST Result Date: 03/21/2023 CLINICAL DATA:  Neuro deficit, acute, stroke suspected.  Dizziness. EXAM: MRI HEAD WITHOUT CONTRAST TECHNIQUE: Multiplanar, multiecho pulse sequences of the brain and surrounding structures were obtained without intravenous contrast. COMPARISON:  Head CT same day.  MRI 07/09/2008. FINDINGS: Brain: Diffusion imaging does not show any acute or subacute infarction or other cause of restricted diffusion. No abnormality affects the brainstem or cerebellum. Cerebral hemispheres show mild age related volume loss without subjective lobar predominance. There are only a few punctate foci of T2 and FLAIR signal within the white matter, less than often seen at this age. The study does not show a pattern of widespread small-vessel disease. No cortical or large vessel territory infarction. No mass lesion, hemorrhage, hydrocephalus or extra-axial collection. Vascular: Major vessels at the base of the brain show flow. Skull and upper cervical spine: Negative Sinuses/Orbits: Mucosal inflammatory changes the sinuses that could be symptomatic. Mucosal thickening is most pronounced in the sphenoid sinus. The right maxillary sinus is chronically small with chronic mucoperiosteal thickening. Left maxillary sinus is chronically small with lesser broad present chronic inflammatory changes. Other: Fluid in the middle ears or mastoid air  cells. IMPRESSION: 1. No acute or reversible finding. Mild age related volume loss. Few punctate foci of T2 and FLAIR signal within the cerebral hemispheric white matter, less than often seen at this age. 2. Mucosal inflammatory changes of the sinuses that could be symptomatic. Chronically small maxillary sinuses with chronic mucoperiosteal thickening right more than left. Pronounced mucosal inflammatory changes of the sphenoid sinus. Electronically Signed   By: Paulina Fusi M.D.   On: 03/21/2023 18:42   CT Head Wo Contrast Result Date: 03/21/2023 CLINICAL DATA:  Neuro deficit, acute, stroke suspected.  Dizziness. EXAM: CT HEAD WITHOUT CONTRAST TECHNIQUE: Contiguous axial images were obtained from the base of the skull through the vertex without intravenous contrast. RADIATION DOSE REDUCTION: This exam was performed according to the departmental dose-optimization program which includes automated exposure control, adjustment of the mA and/or kV according to patient size and/or use of iterative reconstruction technique. COMPARISON:  Head MRI 07/09/2008 FINDINGS: Brain: There is no evidence of an acute infarct, intracranial hemorrhage, mass, midline shift, or extra-axial fluid collection. Mild cerebral atrophy is within normal limits for age. Vascular: Calcified atherosclerosis at the skull base. No hyperdense vessel. Skull: No acute fracture or suspicious lesion. Sinuses/Orbits: Chronic sinusitis, including subtotal opacification of the right maxillary sinus with prominent chronic osteitis. Moderate mucosal thickening in the left sphenoid sinus and mild mucosal thickening in the other sinuses. Clear mastoid air cells. Bilateral cataract extraction. Other: None. IMPRESSION: No evidence of acute intracranial abnormality. Electronically Signed   By: Sebastian Ache M.D.   On: 03/21/2023 17:22   DG Chest 2 View Result Date: 03/21/2023 CLINICAL DATA:  Chest discomfort.  Dizziness. EXAM: CHEST - 2 VIEW COMPARISON:  Chest  radiograph dated Jun 16, 2020. FINDINGS: The heart size and mediastinal contours are within normal limits. Prior median sternotomy and CABG. Aortic atherosclerosis. Mild bibasilar atelectasis. No focal consolidation, pleural effusion, or pneumothorax. No acute osseous abnormality. IMPRESSION: Mild bibasilar atelectasis. Otherwise, no acute cardiopulmonary findings. Electronically Signed   By: Hart Robinsons M.D.   On: 03/21/2023 17:19  Procedures Procedures    Medications Ordered in ED Medications  meclizine (ANTIVERT) tablet 25 mg (25 mg Oral Given 03/21/23 1509)  LORazepam (ATIVAN) injection 0.5 mg (0.5 mg Intravenous Given 03/21/23 1543)    ED Course/ Medical Decision Making/ A&P                                 Medical Decision Making Amount and/or Complexity of Data Reviewed Labs: ordered. Radiology: ordered.  Risk Prescription drug management.   This patient presents to the ED for concern of dizziness, this involves an extensive number of treatment options, and is a complaint that carries with it a high risk of complications and morbidity.  The differential diagnosis includes electrolyte abn, cva, vertigo, dehydration   Co morbidities that complicate the patient evaluation  persistent afib (on Xarelto), cad, high cholesterol, bph, copd, rectal cancer, and chf.   Additional history obtained:  Additional history obtained from epic chart review External records from outside source obtained and reviewed including wife   Lab Tests:  I Ordered, and personally interpreted labs.  The pertinent results include:  cbc nl other than plt low at 143; ua neg for uti, etoh 10; covid/rsv/flu neg; cmp nl, mg nl, bnp 197, trop nl   Imaging Studies ordered:  I ordered imaging studies including ct head, cxr, MRI I independently visualized and interpreted imaging which showed  CXR: Mild bibasilar atelectasis. Otherwise, no acute cardiopulmonary  findings.  CT head: No evidence  of acute intracranial abnormality.  MRI brain: . No acute or reversible finding. Mild age related volume loss. Few  punctate foci of T2 and FLAIR signal within the cerebral hemispheric  white matter, less than often seen at this age.  2. Mucosal inflammatory changes of the sinuses that could be  symptomatic. Chronically small maxillary sinuses with chronic  mucoperiosteal thickening right more than left. Pronounced mucosal  inflammatory changes of the sphenoid sinus.   I agree with the radiologist interpretation   Cardiac Monitoring:  The patient was maintained on a cardiac monitor.  I personally viewed and interpreted the cardiac monitored which showed an underlying rhythm of: afib   Medicines ordered and prescription drug management:  I ordered medication including ativan/antivert  for sx  Reevaluation of the patient after these medicines showed that the patient improved I have reviewed the patients home medicines and have made adjustments as needed   Test Considered:  Ct/mri   Problem List / ED Course:  Dizziness:  likely vertigo.  Pt is feeling much better after meds and is able to ambulate.  MRI neg for CVA. Etoh Abuse:  likely contributing to sx.  He wants to try to stop, but gets w/dr sx when he does.  He is d/c with librium.     Reevaluation:  After the interventions noted above, I reevaluated the patient and found that they have :improved   Social Determinants of Health:  Lives at home   Dispostion:  After consideration of the diagnostic results and the patients response to treatment, I feel that the patent would benefit from discharge with outpatient f/u.          Final Clinical Impression(s) / ED Diagnoses Final diagnoses:  Dizziness  Alcohol abuse    Rx / DC Orders ED Discharge Orders          Ordered    chlordiazePOXIDE (LIBRIUM) 25 MG capsule  03/21/23 1901    meclizine (ANTIVERT) 25 MG tablet  3 times daily PRN        03/21/23  1901              Jacalyn Lefevre, MD 03/21/23 [phone]

## 2023-05-22 ENCOUNTER — Other Ambulatory Visit (HOSPITAL_COMMUNITY): Payer: Self-pay | Admitting: Cardiology

## 2023-06-14 ENCOUNTER — Encounter (HOSPITAL_COMMUNITY)

## 2023-06-23 ENCOUNTER — Encounter (HOSPITAL_COMMUNITY): Payer: Self-pay | Admitting: Cardiology

## 2023-06-23 ENCOUNTER — Ambulatory Visit (HOSPITAL_COMMUNITY)
Admission: RE | Admit: 2023-06-23 | Discharge: 2023-06-23 | Disposition: A | Discharge status: Home / Self Care | Source: Ambulatory Visit | Attending: Cardiology | Admitting: Cardiology

## 2023-06-23 VITALS — BP 110/70 | HR 58 | Wt 185.6 lb

## 2023-06-23 DIAGNOSIS — I4819 Other persistent atrial fibrillation: Secondary | ICD-10-CM | POA: Insufficient documentation

## 2023-06-23 DIAGNOSIS — Z951 Presence of aortocoronary bypass graft: Secondary | ICD-10-CM | POA: Insufficient documentation

## 2023-06-23 DIAGNOSIS — Z7984 Long term (current) use of oral hypoglycemic drugs: Secondary | ICD-10-CM | POA: Diagnosis not present

## 2023-06-23 DIAGNOSIS — E785 Hyperlipidemia, unspecified: Secondary | ICD-10-CM | POA: Diagnosis not present

## 2023-06-23 DIAGNOSIS — I509 Heart failure, unspecified: Secondary | ICD-10-CM | POA: Diagnosis present

## 2023-06-23 DIAGNOSIS — Z85048 Personal history of other malignant neoplasm of rectum, rectosigmoid junction, and anus: Secondary | ICD-10-CM | POA: Diagnosis not present

## 2023-06-23 DIAGNOSIS — Z7901 Long term (current) use of anticoagulants: Secondary | ICD-10-CM | POA: Diagnosis not present

## 2023-06-23 DIAGNOSIS — I5022 Chronic systolic (congestive) heart failure: Secondary | ICD-10-CM | POA: Diagnosis not present

## 2023-06-23 DIAGNOSIS — Z933 Colostomy status: Secondary | ICD-10-CM | POA: Insufficient documentation

## 2023-06-23 DIAGNOSIS — I2510 Atherosclerotic heart disease of native coronary artery without angina pectoris: Secondary | ICD-10-CM | POA: Insufficient documentation

## 2023-06-23 LAB — BASIC METABOLIC PANEL WITH GFR
Anion gap: 12 (ref 5–15)
BUN: 14 mg/dL (ref 8–23)
CO2: 29 mmol/L (ref 22–32)
Calcium: 9.1 mg/dL (ref 8.9–10.3)
Chloride: 98 mmol/L (ref 98–111)
Creatinine, Ser: 1.04 mg/dL (ref 0.61–1.24)
GFR, Estimated: >60 mL/min (ref >60)
Glucose, Bld: 94 mg/dL (ref 70–99)
Potassium: 4 mmol/L (ref 3.5–5.1)
Sodium: 139 mmol/L (ref 135–145)

## 2023-06-23 LAB — LIPID PANEL
Cholesterol: 115 mg/dL (ref 0–200)
HDL: 37 mg/dL — ABNORMAL LOW (ref >40)
LDL Cholesterol: 64 mg/dL (ref 0–99)
Total CHOL/HDL Ratio: 3.1 ratio
Triglycerides: 71 mg/dL (ref <150)
VLDL: 14 mg/dL (ref 0–40)

## 2023-06-23 LAB — BRAIN NATRIURETIC PEPTIDE: B Natriuretic Peptide: 229.6 pg/mL — ABNORMAL HIGH (ref 0.0–100.0)

## 2023-06-23 NOTE — Patient Instructions (Signed)
 There has been no changes to your medications.  Labs done today, your results will be available in MyChart, we will contact you for abnormal readings.  Your physician has requested that you have an echocardiogram. Echocardiography is a painless test that uses sound waves to create images of your heart. It provides your doctor with information about the size and shape of your heart and how well your heart's chambers and valves are working. This procedure takes approximately one hour. There are no restrictions for this procedure. Please do NOT wear cologne, perfume, aftershave, or lotions (deodorant is allowed). Please arrive 15 minutes prior to your appointment time.  Please note: We ask at that you not bring children with you during ultrasound (echo/ vascular) testing. Due to room size and safety concerns, children are not allowed in the ultrasound rooms during exams. Our front office staff cannot provide observation of children in our lobby area while testing is being conducted. An adult accompanying a patient to their appointment will only be allowed in the ultrasound room at the discretion of the ultrasound technician under special circumstances. We apologize for any inconvenience.  Your physician recommends that you schedule a follow-up appointment in: 6 months.  If you have any questions or concerns before your next appointment please send [place] a message through [place] or call our office at [phone].    TO LEAVE A MESSAGE FOR THE NURSE SELECT OPTION 2, PLEASE LEAVE A MESSAGE INCLUDING: YOUR NAME DATE OF BIRTH CALL BACK NUMBER REASON FOR CALL**this is important as we prioritize the call backs  YOU WILL RECEIVE A CALL BACK THE SAME DAY AS LONG AS YOU CALL BEFORE 4:00 PM  At the [facility], you and your health needs are our priority. As part of our continuing mission to provide you with exceptional heart care, we have created designated Provider Care Teams. These Care  Teams include your primary Cardiologist (physician) and Advanced Practice Providers (APPs- Physician Assistants and Nurse Practitioners) who all work together to provide you with the care you need, when you need it.   You may see any of the following providers on your designated Care Team at your next follow up: Dr Arvilla Meres Dr Marca Ancona Dr. Dorthula Nettles Dr. Clearnce Hasten Amy Filbert Schilder, NP Robbie Lis, [place] [facility] [place], [place] Brynda Peon, NP [place] Lee, NP Clarisa Kindred, NP Karle Plumber, PharmD Enos Fling, PharmD   Please be sure to bring in all your medications bottles to every appointment.    Thank you for choosing [facility]

## 2023-06-25 ENCOUNTER — Ambulatory Visit (HOSPITAL_COMMUNITY): Payer: Self-pay | Admitting: Cardiology

## 2023-06-25 NOTE — Progress Notes (Signed)
 PCP: Dr. Lewayne Records Cardiology: Dr. Mitzie Anda  Chief complaint: CHF   78 y.o. with history of CAD s/p CABG and rectal cancer s/p abdominal perineal resection in 5/14 presents for cardiology followup.  He had CABG x 6 in 2005.  Stress tests later in 2005 and in 2008 were normal.  As above, he had APR in 5/14.  He had a complicated colostomy revision in 12/14.  After this operation, he developed abdominal wall cellulitis and septic shock, also in 12/14.  He was positive for influenza also.  His colostomy will be permanent. He had moved to [place] but has returned to live in [place].    At a prior appointment, he was noted to be in atrial fibrillation with rate control.  He did not realize that he was in atrial fibrillation.  Holter monitor showed persistent AF.  He looked volume overloaded on exam and Lasix  was started.  Echo in 8/17 showed EF 45-50%, diffuse hypokinesis.  He underwent DCCV 9/17 but did not hold NSR.  Lexiscan  Cardiolite in 10/17 showed EF 40%, lateral fixed defect, no ischemia.  Echo in 8/18 showed EF 40% with wall motion abnormalities.    Echo in 7/22 showed EF up to 50-55%, mildly decreased RV systolic function.   Follow up 1/23, NYHA II, volume mildly up and instructed to increase Lasix  to 40 mg daily.  Echo 2/24 EF 50% with mild LVH, inferolateral hypokinesis, mildly decreased RV systolic function, PASP 37 mmHg.   Today he returns for HF follow up with his wife. He has been doing better since switching back from torsemide  to Lasix .  He had significant lightheadedness/dizziness while taking torsemide  that has now resolved.  He walks on the treadmill 30 minutes/day with no significant exertional dyspnea.  No chest pain.  No claudication.  He enjoys working in his garden, no problems doing this.  Weight up 4 lbs.   ECG (personally reviewed): Atrial fibrillation rate 54  Labs (2/24): K 4.1, creatinine 0.97, LDL 61 Labs (2/25): BNP 197, K 3.9, creatinine 1.17   PMH: 1. Rectal cancer:  Abdominal perineal resection in 5/14, has permanent colostomy.  2. CAD: CABG in 2005 with LIMA-LAD, SVG-D, seq SVG-OM1/dLCx, seq SVG-PDA/PLV.  Stress myoviews in 11/05 and in 2008 were normal. 3. Hyperlipidemia 4. Atrial fibrillation: First noted 8/17.  Now chronic.  - DCCV 9/17: Converted to NSR but went back into atrial fibrillation before leaving hospital.  5. Chronic systolic CHF: Ischemic cardiomyopathy.  - Echo (8/17) with EF 45-50%, mild LVH, mildly dilated LV.   - Lexiscan  Cardiolite (10/17): EF 40%, no ischemia, lateral fixed defect.  - Echo (8/18): EF 40%, anterolateral/inferolateral HK, basal to mid inferior AK, normal RV size with mildly decreased systolic function.  - TEE (12/19): EF 50%, mildly dilated and mildly dysfunctional RV, no RA mass.  - Echo (7/22): EF 50-55%, mildly decreased RV function.  - Echo (2/24): EF 50% with mild LVH, inferolateral hypokinesis, mildly decreased RV systolic function, PASP 37 mmHg.  6. Bradycardia 7. 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     FH: CAD (brother), CVA (father), CHF (mother).    ROS: All systems were reviewed and negative except as per HPI.   Current Outpatient Medications  Medication Sig Dispense Refill   acetaminophen  (TYLENOL ) 500 MG tablet Take 1,000 mg by mouth every 6 (six) hours as needed for mild pain (pain score 1-3).  albuterol  (PROVENTIL ) (2.5 MG/3ML) 0.083% nebulizer solution Take 2.5 mg by nebulization every 4 (four) hours as needed for wheezing or shortness of breath.     ALPRAZolam  (XANAX ) 1 MG tablet Take 0.5-1 mg by mouth 4 (four) times daily as needed for anxiety or sleep.     atorvastatin  (LIPITOR) 40 MG tablet TAKE 1 TABLET BY MOUTH DAILY 30 tablet 4   carvedilol  (COREG ) 3.125 MG tablet TAKE ONE TABLET BY MOUTH TWICE DAILY 60 tablet 1   Cholecalciferol (VITAMIN D) 50 MCG (2000 UT) CAPS Take 1 capsule by  mouth daily.     empagliflozin  (JARDIANCE ) 10 MG TABS tablet Take 1 tablet (10 mg total) by mouth daily before breakfast. NEEDS FOLLOW UP APPOINTMENT FOR MORE REFILLS 90 tablet 0   furosemide  (LASIX ) 40 MG tablet Take 40 mg by mouth 4 (four) times daily as needed for fluid or edema.     furosemide  (LASIX ) 40 MG tablet Take 80 mg by mouth 2 (two) times daily.     losartan  (COZAAR ) 25 MG tablet Take 1 tablet (25 mg total) by mouth daily. 90 tablet 3   potassium chloride  SA (KLOR-CON  M) 20 MEQ tablet Take 20 mEq by mouth at bedtime.     rivaroxaban  (XARELTO ) 20 MG TABS tablet Take 1 tablet (20 mg total) by mouth daily with supper. 30 tablet 2   tamsulosin  (FLOMAX ) 0.4 MG CAPS Take 0.4 mg by mouth daily after breakfast.      No current facility-administered medications for this encounter.   BP 110/70   Pulse (!) 58   Wt 84.2 kg (185 lb 9.6 oz)   SpO2 99%   BMI 29.07 kg/m   Wt Readings from Last 3 Encounters:  06/23/23 84.2 kg (185 lb 9.6 oz)  03/21/23 84.4 kg (186 lb)  08/16/22 79.8 kg (176 lb)   General: NAD Neck: No JVD, no thyromegaly or thyroid  nodule.  Lungs: Clear to auscultation bilaterally with normal respiratory effort. CV: Nondisplaced PMI.  Heart regular S1/S2, no S3/S4, no murmur.  No peripheral edema.  No carotid bruit.  Normal pedal pulses.  Abdomen: Soft, nontender, no hepatosplenomegaly, no distention.  Skin: Intact without lesions or rashes.  Neurologic: Alert and oriented x 3.  Psych: Normal affect. Extremities: No clubbing or cyanosis.  HEENT: Normal.   Assessment/Plan: 1. CAD: S/p CABG.  Cardiolite in 10/17 with prior lateral MI but no ischemia. No chest pain.  - No ASA given stable CAD and Xarelto  use.  - Continue statin, check lipids. 2. Hyperlipidemia: On atorvastatin . Check lipids as above.  3. Chronic systolic CHF: Echo in 8/18 showed EF 40% with wall motion abnormalities.  Echo in 7/22 with EF up to 50-55%, mild RV dysfunction.  Echo 2/24 EF 50% with mild  LVH, inferolateral hypokinesis, mildly decreased RV systolic function, PASP 37 mmHg.  NYHA class II symptoms. He is not volume overloaded on exam.  - Continue Lasix  40 mg po bid.  - I will arrange for repeat echo.  - Continue empagliflozin  10 mg daily. - Continue carvedilol  3.125 mg bid.    - Continue losartan  25 mg daily.   - Off spiro w/ gynecomastia, unable to tolerate eplerenone .  4. Atrial fibrillation:  He does not feel the atrial fibrillation, not sure how long it has been present.  He failed cardioversion in 9/17. He is not a good candidate for antiarrhythmics given baseline long QT and history of CAD.  DCCV x 2 unsuccessful.  Atrial fibrillation is now permanent.  -  Continue low dose Coreg .  - Continue Xarelto . CBC today. 5. Suspect sleep apnea: Encouraged him in the past to get a sleep study but he is not interested. 6. H/o rectal cancer: s/p LLQ colectomy (5/14).   Follow up in 6 months with Dr. Mitzie Anda  I spent 32 minutes reviewing records, interviewing/examining patient, and managing orders.   Cody Coleman  06/25/2023

## 2023-08-15 ENCOUNTER — Ambulatory Visit (HOSPITAL_COMMUNITY)
Admission: RE | Admit: 2023-08-15 | Discharge: 2023-08-15 | Disposition: A | Discharge status: Home / Self Care | Source: Ambulatory Visit | Attending: Cardiology | Admitting: Cardiology

## 2023-08-15 DIAGNOSIS — I5022 Chronic systolic (congestive) heart failure: Secondary | ICD-10-CM | POA: Insufficient documentation

## 2023-08-15 LAB — ECHOCARDIOGRAM COMPLETE
Calc EF: 52.1 %
S' Lateral: 4.2 cm
Single Plane A2C EF: 54.4 %
Single Plane A4C EF: 52.5 %

## 2023-09-18 ENCOUNTER — Other Ambulatory Visit (HOSPITAL_COMMUNITY): Payer: Self-pay

## 2023-09-18 DIAGNOSIS — I5022 Chronic systolic (congestive) heart failure: Secondary | ICD-10-CM

## 2023-09-18 MED ORDER — EMPAGLIFLOZIN 10 MG PO TABS: 10.0000 mg | ORAL_TABLET | Freq: Every day | ORAL | 1 refills | Status: AC

## 2023-12-22 ENCOUNTER — Telehealth (HOSPITAL_COMMUNITY): Payer: Self-pay

## 2023-12-22 NOTE — Telephone Encounter (Signed)
 Called to confirm/remind patient of their appointment at the [facility] on 12/25/23.   Appointment:   [x] Confirmed  [] Left mess   [] No answer/No voice mail  [] VM Full/unable to leave message  [] Phone not in service  Patient reminded to bring all medications and/or complete list.  Confirmed patient has transportation. Gave directions, instructed to utilize valet parking.

## 2023-12-25 ENCOUNTER — Ambulatory Visit (HOSPITAL_COMMUNITY): Payer: Self-pay | Admitting: Family Medicine

## 2023-12-25 ENCOUNTER — Ambulatory Visit (HOSPITAL_COMMUNITY)
Admission: RE | Admit: 2023-12-25 | Discharge: 2023-12-25 | Disposition: A | Discharge status: Home / Self Care | Source: Ambulatory Visit | Attending: Family Medicine | Admitting: Family Medicine

## 2023-12-25 ENCOUNTER — Encounter: Payer: Self-pay | Admitting: Physician Assistant

## 2023-12-25 ENCOUNTER — Telehealth (HOSPITAL_COMMUNITY): Payer: Self-pay

## 2023-12-25 ENCOUNTER — Encounter (HOSPITAL_COMMUNITY): Payer: Self-pay

## 2023-12-25 VITALS — BP 104/60 | HR 53 | Ht 67.0 in | Wt 190.6 lb

## 2023-12-25 DIAGNOSIS — Z79899 Other long term (current) drug therapy: Secondary | ICD-10-CM | POA: Diagnosis not present

## 2023-12-25 DIAGNOSIS — Z951 Presence of aortocoronary bypass graft: Secondary | ICD-10-CM | POA: Insufficient documentation

## 2023-12-25 DIAGNOSIS — D509 Iron deficiency anemia, unspecified: Secondary | ICD-10-CM

## 2023-12-25 DIAGNOSIS — I252 Old myocardial infarction: Secondary | ICD-10-CM | POA: Insufficient documentation

## 2023-12-25 DIAGNOSIS — Z933 Colostomy status: Secondary | ICD-10-CM | POA: Diagnosis not present

## 2023-12-25 DIAGNOSIS — Z87891 Personal history of nicotine dependence: Secondary | ICD-10-CM | POA: Diagnosis not present

## 2023-12-25 DIAGNOSIS — I5022 Chronic systolic (congestive) heart failure: Secondary | ICD-10-CM | POA: Diagnosis not present

## 2023-12-25 DIAGNOSIS — Z7901 Long term (current) use of anticoagulants: Secondary | ICD-10-CM | POA: Diagnosis not present

## 2023-12-25 DIAGNOSIS — Z85048 Personal history of other malignant neoplasm of rectum, rectosigmoid junction, and anus: Secondary | ICD-10-CM | POA: Insufficient documentation

## 2023-12-25 DIAGNOSIS — Z9049 Acquired absence of other specified parts of digestive tract: Secondary | ICD-10-CM | POA: Insufficient documentation

## 2023-12-25 DIAGNOSIS — D649 Anemia, unspecified: Secondary | ICD-10-CM

## 2023-12-25 DIAGNOSIS — I4821 Permanent atrial fibrillation: Secondary | ICD-10-CM | POA: Insufficient documentation

## 2023-12-25 DIAGNOSIS — E785 Hyperlipidemia, unspecified: Secondary | ICD-10-CM | POA: Insufficient documentation

## 2023-12-25 DIAGNOSIS — I2510 Atherosclerotic heart disease of native coronary artery without angina pectoris: Secondary | ICD-10-CM | POA: Insufficient documentation

## 2023-12-25 DIAGNOSIS — R29818 Other symptoms and signs involving the nervous system: Secondary | ICD-10-CM

## 2023-12-25 LAB — BASIC METABOLIC PANEL WITH GFR
Anion gap: 10 (ref 5–15)
BUN: 28 mg/dL — ABNORMAL HIGH (ref 8–23)
CO2: 27 mmol/L (ref 22–32)
Calcium: 8.7 mg/dL — ABNORMAL LOW (ref 8.9–10.3)
Chloride: 103 mmol/L (ref 98–111)
Creatinine, Ser: 1.43 mg/dL — ABNORMAL HIGH (ref 0.61–1.24)
GFR, Estimated: 51 mL/min — ABNORMAL LOW (ref >60)
Glucose, Bld: 93 mg/dL (ref 70–99)
Potassium: 4.2 mmol/L (ref 3.5–5.1)
Sodium: 140 mmol/L (ref 135–145)

## 2023-12-25 LAB — IRON AND TIBC
Iron: 17 ug/dL — ABNORMAL LOW (ref 45–182)
Saturation Ratios: 3 % — ABNORMAL LOW (ref 17.9–39.5)
TIBC: 508 ug/dL — ABNORMAL HIGH (ref 250–450)
UIBC: 491 ug/dL

## 2023-12-25 LAB — BRAIN NATRIURETIC PEPTIDE: B Natriuretic Peptide: 341.1 pg/mL — ABNORMAL HIGH (ref 0.0–100.0)

## 2023-12-25 LAB — CBC
HCT: 26.2 % — ABNORMAL LOW (ref 39.0–52.0)
Hemoglobin: 7.8 g/dL — ABNORMAL LOW (ref 13.0–17.0)
MCH: 24.1 pg — ABNORMAL LOW (ref 26.0–34.0)
MCHC: 29.8 g/dL — ABNORMAL LOW (ref 30.0–36.0)
MCV: 81.1 fL (ref 80.0–100.0)
Platelets: 185 K/uL (ref 150–400)
RBC: 3.23 MIL/uL — ABNORMAL LOW (ref 4.22–5.81)
RDW: 15.6 % — ABNORMAL HIGH (ref 11.5–15.5)
WBC: 5.1 K/uL (ref 4.0–10.5)
nRBC: 0 % (ref 0.0–0.2)

## 2023-12-25 LAB — FERRITIN: Ferritin: 3 ng/mL — ABNORMAL LOW (ref 24–336)

## 2023-12-25 MED ORDER — POTASSIUM CHLORIDE CRYS ER 20 MEQ PO TBCR: 20.0000 meq | EXTENDED_RELEASE_TABLET | Freq: Two times a day (BID) | ORAL | 5 refills | Status: AC

## 2023-12-25 MED ORDER — FUROSEMIDE 40 MG PO TABS: 100.0000 mg | ORAL_TABLET | Freq: Two times a day (BID) | ORAL | 5 refills | Status: AC

## 2023-12-25 NOTE — Patient Instructions (Signed)
 Medication Changes:  INCREASE FUROSEMIDE  TO 100MG  TWICE DAILY   INCREASE POTASSIUM TO TWICE DAILY   Lab Work:  Labs done today, your results will be available in MyChart, we will contact you for abnormal readings.  THEN LABS AGAIN IN 2 WEEKS AS SCHEDULED   Follow-Up in: 6 MONTHS PLEASE CALL OUR OFFICE AROUND MARCH 2026 TO GET SCHEDULED FOR YOUR APPOINTMENT. PHONE NUMBER IS [phone] OPTION 2   At the [facility], you and your health needs are our priority. We have a designated team specialized in the treatment of Heart Failure. This Care Team includes your primary Heart Failure Specialized Cardiologist (physician), Advanced Practice Providers (APPs- Physician Assistants and Nurse Practitioners), and Pharmacist who all work together to provide you with the care you need, when you need it.   You may see any of the following providers on your designated Care Team at your next follow up:  Dr. Toribio Fuel Dr. Ezra Shuck Dr. Odis Brownie Greig Mosses, NP Caffie Shed, [place] [facility] [place], [place] Beckey Coe, NP Jordan Lee, NP Tinnie Redman, PharmD   Please be sure to bring in all your medications bottles to every appointment.   Need to Contact Us :  If you have any questions or concerns before your next appointment please send us  a message through [place] or call our office at [phone].    TO LEAVE A MESSAGE FOR THE NURSE SELECT OPTION 2, PLEASE LEAVE A MESSAGE INCLUDING: YOUR NAME DATE OF BIRTH CALL BACK NUMBER REASON FOR CALL**this is important as we prioritize the call backs  YOU WILL RECEIVE A CALL BACK THE SAME DAY AS LONG AS YOU CALL BEFORE 4:00 PM

## 2023-12-25 NOTE — Progress Notes (Signed)
 ReDS Vest / Clip - 12/25/23 0921       ReDS Vest / Clip   Station Marker D    Ruler Value 32    ReDS Value Range Moderate volume overload    ReDS Actual Value 39

## 2023-12-25 NOTE — Telephone Encounter (Signed)
 Patient referred to infusion pharmacy team for ambulatory infusion of IV iron.  Insurance - U[employer] Medicare Site of care - Site of care: [facility] Dx code - D50.9 IV Iron Therapy - Feraheme 510 mg IV x 2  Infusion appointments - Scheduling team will schedule patient as soon as possible.    Artie Mcintyre D. Shaketta Rill, PharmD

## 2023-12-25 NOTE — Progress Notes (Signed)
 PCP: Dr. Bertell Cardiology: Dr. Rolan   78 y.o. with history of CAD s/p CABG and rectal cancer s/p abdominal perineal resection in 5/14 presents for cardiology followup.  He had CABG x 6 in 2005.  Stress tests later in 2005 and in 2008 were normal.  As above, he had APR in 5/14.  He had a complicated colostomy revision in 12/14.  After this operation, he developed abdominal wall cellulitis and septic shock, also in 12/14.  He was positive for influenza also.  His colostomy will be permanent. He had moved to [place] but has returned to live in [place].    At a prior appointment, he was noted to be in atrial fibrillation with rate control.  He did not realize that he was in atrial fibrillation.  Holter monitor showed persistent AF.  He looked volume overloaded on exam and Lasix  was started.  Echo in 8/17 showed EF 45-50%, diffuse hypokinesis.  He underwent DCCV 9/17 but did not hold NSR.  Lexiscan  Cardiolite in 10/17 showed EF 40%, lateral fixed defect, no ischemia.  Echo in 8/18 showed EF 40% with wall motion abnormalities.    Echo in 7/22 showed EF up to 50-55%, mildly decreased RV systolic function.   Follow up 1/23, NYHA II, volume mildly up and instructed to increase Lasix  to 40 mg daily.  Echo 2/24 EF 50% with mild LVH, inferolateral hypokinesis, mildly decreased RV systolic function, PASP 37 mmHg.   Echo 7/25 showed EF 50-55%, RV low/normal  Today he returns for HF follow up with wife. Overall feeling fair.This summer was hard for him with being dizzy in the heat. Has had to slow down because he wears out easily. He is more SOB with manual labor and yardwork. Does OK with ADLs. Not sleeping well x decades. Feels fatigued, naps during the day. Denies palpitations, abnormal bleeding, CP, dizziness, edema, or PND/Orthopnea. Appetite ok. Weight at home 185 pounds. Taking all medications.   ReDs reading: 39%, abnormal  ECG (personally reviewed): none ordered today.  Labs (2/24): K 4.1,  creatinine 0.97, LDL 61 Labs (2/25): BNP 197, K 3.9, creatinine 1.17 Labs (5/25): K 4.0, creatinine 1.04, LDL 64   PMH: 1. Rectal cancer: Abdominal perineal resection in 5/14, has permanent colostomy.  2. CAD: CABG in 2005 with LIMA-LAD, SVG-D, seq SVG-OM1/dLCx, seq SVG-PDA/PLV.  Stress myoviews in 11/05 and in 2008 were normal. 3. Hyperlipidemia 4. Atrial fibrillation: First noted 8/17.  Now chronic.  - DCCV 9/17: Converted to NSR but went back into atrial fibrillation before leaving hospital.  5. Chronic systolic CHF: Ischemic cardiomyopathy.  - Echo (8/17) with EF 45-50%, mild LVH, mildly dilated LV.   - Lexiscan  Cardiolite (10/17): EF 40%, no ischemia, lateral fixed defect.  - Echo (8/18): EF 40%, anterolateral/inferolateral HK, basal to mid inferior AK, normal RV size with mildly decreased systolic function.  - TEE (12/19): EF 50%, mildly dilated and mildly dysfunctional RV, no RA mass.  - Echo (7/22): EF 50-55%, mildly decreased RV function.  - Echo (2/24): EF 50% with mild LVH, inferolateral hypokinesis, mildly decreased RV systolic function, PASP 37 mmHg.  - Echo (7/25): EF 50-55%, RV low/normal 6. Bradycardia 7. 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     FH: CAD (brother), CVA (father), CHF (mother).    ROS: All systems were reviewed and negative except as per HPI.   Current Outpatient Medications  Medication Sig Dispense  Refill   acetaminophen  (TYLENOL ) 500 MG tablet Take 1,000 mg by mouth every 6 (six) hours as needed for mild pain (pain score 1-3).     albuterol  (PROVENTIL ) (2.5 MG/3ML) 0.083% nebulizer solution Take 2.5 mg by nebulization every 4 (four) hours as needed for wheezing or shortness of breath.     ALPRAZolam  (XANAX ) 1 MG tablet Take 0.5-1 mg by mouth 4 (four) times daily as needed for anxiety or sleep.     atorvastatin  (LIPITOR) 40 MG tablet TAKE 1 TABLET BY  MOUTH DAILY 30 tablet 4   carvedilol  (COREG ) 3.125 MG tablet TAKE ONE TABLET BY MOUTH TWICE DAILY 60 tablet 1   Cholecalciferol (VITAMIN D) 50 MCG (2000 UT) CAPS Take 1 capsule by mouth daily.     empagliflozin  (JARDIANCE ) 10 MG TABS tablet Take 1 tablet (10 mg total) by mouth daily before breakfast. 90 tablet 1   furosemide  (LASIX ) 40 MG tablet Take 40 mg by mouth 4 (four) times daily as needed for fluid or edema.     furosemide  (LASIX ) 40 MG tablet Take 80 mg by mouth 2 (two) times daily.     losartan  (COZAAR ) 25 MG tablet Take 1 tablet (25 mg total) by mouth daily. 90 tablet 3   potassium chloride  SA (KLOR-CON  M) 20 MEQ tablet Take 20 mEq by mouth at bedtime.     rivaroxaban  (XARELTO ) 20 MG TABS tablet Take 1 tablet (20 mg total) by mouth daily with supper. 30 tablet 2   tamsulosin  (FLOMAX ) 0.4 MG CAPS Take 0.4 mg by mouth daily after breakfast.      No current facility-administered medications for this encounter.   BP 104/60   Pulse (!) 53   Ht 5' 7 (1.702 m)   Wt 86.5 kg (190 lb 9.6 oz)   SpO2 99%   BMI 29.85 kg/m   Wt Readings from Last 3 Encounters:  12/25/23 86.5 kg (190 lb 9.6 oz)  06/23/23 84.2 kg (185 lb 9.6 oz)  03/21/23 84.4 kg (186 lb)   Physical Exam General:  NAD. No resp difficulty, walked into clinic, fatigued-appearing HEENT: Normal Neck: Supple. JVP 10 Cor: Irregular rate & rhythm. No rubs, gallops or murmurs. Lungs: Clear Abdomen: Soft, nontender, nondistended.  Extremities: No cyanosis, clubbing, rash, edema Neuro: Alert & oriented x 3, moves all 4 extremities w/o difficulty. Affect pleasant.  Assessment/Plan: 1. CAD: S/p CABG.  Cardiolite in 10/17 with prior lateral MI but no ischemia. No chest pain.  - No ASA given stable CAD and Xarelto  use.  - Continue atorvastatin  40 mg daily. 2. Hyperlipidemia: On atorvastatin . LDL 64 (5/25) 3. Chronic systolic CHF: Echo in 8/18 showed EF 40% with wall motion abnormalities.  Echo in 7/22 with EF up to 50-55%, mild  RV dysfunction.  Echo 2/24 EF 50% with mild LVH, inferolateral hypokinesis, mildly decreased RV systolic function, PASP 37 mmHg.  Echo 7/25: EF 50-55%. NYHA II symptoms, suspect physical deconditioning contributing. He is mildly volume overloaded on exam. ReDs 39% - Increase Lasix  to 100 mg bid, increase KCL to 20 bid. BMET/BNP today, repeat BMET in 10-14 days - Continue empagliflozin  10 mg daily. - Continue carvedilol  3.125 mg bid.    - Continue losartan  25 mg daily.   - Off spiro w/ gynecomastia, unable to tolerate eplerenone .  - Discussed importance of getting back into a dedicated exercise routine. 4. Atrial fibrillation:  He does not feel the atrial fibrillation, not sure how long it has been present.  He failed cardioversion in  9/17. He is not a good candidate for antiarrhythmics given baseline long QT and history of CAD.  DCCV x 2 unsuccessful.  Atrial fibrillation is now permanent.  - Continue low dose Coreg .  - Continue Xarelto  20 mg daily. No bleeding issues. Check CBC & iron panel today. 5. Suspect sleep apnea: Discussed sleep study today, he is not interested. 6. H/o rectal cancer: s/p LLQ colectomy (5/14).   Follow up in 6 months with Dr. Rolan.  Harlene HERO [facility] FNP-BC 12/25/2023

## 2023-12-26 ENCOUNTER — Telehealth (HOSPITAL_COMMUNITY): Payer: Self-pay

## 2023-12-26 NOTE — Telephone Encounter (Signed)
 Auth Submission: NO AUTH NEEDED Site of care: Site of care: [facility] Payer: [employer] Medication & CPT/J Code(s) submitted: Feraheme (ferumoxytol) [id number] Diagnosis Code: D50.9 Route of submission (phone, fax, portal): portal Phone # Fax # Auth type: Buy/Bill HB Units/visits requested: 510mg  x 2 doses Reference number: [id number] Approval from: 12/26/2023 to 01/31/24

## 2023-12-29 ENCOUNTER — Other Ambulatory Visit: Payer: Self-pay

## 2023-12-29 ENCOUNTER — Inpatient Hospital Stay: Attending: Hematology and Oncology | Admitting: Hematology and Oncology

## 2023-12-29 ENCOUNTER — Telehealth: Payer: Self-pay

## 2023-12-29 ENCOUNTER — Inpatient Hospital Stay

## 2023-12-29 VITALS — BP 125/54 | HR 71 | Temp 98.0°F | Resp 18 | Ht 67.0 in | Wt 190.5 lb

## 2023-12-29 DIAGNOSIS — Z7901 Long term (current) use of anticoagulants: Secondary | ICD-10-CM | POA: Insufficient documentation

## 2023-12-29 DIAGNOSIS — Z79899 Other long term (current) drug therapy: Secondary | ICD-10-CM | POA: Insufficient documentation

## 2023-12-29 DIAGNOSIS — Z933 Colostomy status: Secondary | ICD-10-CM | POA: Diagnosis not present

## 2023-12-29 DIAGNOSIS — Z87891 Personal history of nicotine dependence: Secondary | ICD-10-CM | POA: Diagnosis not present

## 2023-12-29 DIAGNOSIS — D509 Iron deficiency anemia, unspecified: Secondary | ICD-10-CM

## 2023-12-29 DIAGNOSIS — Z85048 Personal history of other malignant neoplasm of rectum, rectosigmoid junction, and anus: Secondary | ICD-10-CM | POA: Insufficient documentation

## 2023-12-29 DIAGNOSIS — D582 Other hemoglobinopathies: Secondary | ICD-10-CM

## 2023-12-29 LAB — IRON AND IRON BINDING CAPACITY (CC-WL,HP ONLY)
Iron: 12 ug/dL — ABNORMAL LOW (ref 45–182)
Saturation Ratios: 3 % — ABNORMAL LOW (ref 17.9–39.5)
TIBC: 493 ug/dL — ABNORMAL HIGH (ref 250–450)
UIBC: 480 ug/dL

## 2023-12-29 LAB — CBC WITH DIFFERENTIAL/PLATELET
Abs Immature Granulocytes: 0.02 K/uL (ref 0.00–0.07)
Basophils Absolute: 0 K/uL (ref 0.0–0.1)
Basophils Relative: 1 %
Eosinophils Absolute: 0.3 K/uL (ref 0.0–0.5)
Eosinophils Relative: 5 %
HCT: 26 % — ABNORMAL LOW (ref 39.0–52.0)
Hemoglobin: 7.9 g/dL — ABNORMAL LOW (ref 13.0–17.0)
Immature Granulocytes: 0 %
Lymphocytes Relative: 16 %
Lymphs Abs: 0.8 K/uL (ref 0.7–4.0)
MCH: 24.2 pg — ABNORMAL LOW (ref 26.0–34.0)
MCHC: 30.4 g/dL (ref 30.0–36.0)
MCV: 79.5 fL — ABNORMAL LOW (ref 80.0–100.0)
Monocytes Absolute: 0.7 K/uL (ref 0.1–1.0)
Monocytes Relative: 14 %
Neutro Abs: 3.3 K/uL (ref 1.7–7.7)
Neutrophils Relative %: 64 %
Platelets: 185 K/uL (ref 150–400)
RBC: 3.27 MIL/uL — ABNORMAL LOW (ref 4.22–5.81)
RDW: 15.8 % — ABNORMAL HIGH (ref 11.5–15.5)
WBC: 5.1 K/uL (ref 4.0–10.5)
nRBC: 0 % (ref 0.0–0.2)

## 2023-12-29 LAB — SAMPLE TO BLOOD BANK

## 2023-12-29 LAB — FERRITIN: Ferritin: 8 ng/mL — ABNORMAL LOW (ref 24–336)

## 2023-12-29 NOTE — Telephone Encounter (Signed)
 Patient scheduled to receive one unit of RBC transfusion on 12/1 at 0830. Patient acknowledged understanding of plan and confirmed intent to retain blood band.

## 2023-12-29 NOTE — Progress Notes (Signed)
 [facility] CONSULT NOTE  Patient Care Team: [name], MD as PCP - General (Internal Medicine) Debby Hila, MD as Consulting Physician (General Surgery) Sheldon Standing, MD as Consulting Physician (General Surgery) Rolan Ezra RAMAN, MD as Consulting Physician (Cardiology) Whitfield Fallow, MD as Consulting Physician (Family Medicine) Golda Claudis PENNER, MD (Inactive) as Consulting Physician (Gastroenterology)  CHIEF COMPLAINTS/PURPOSE OF CONSULTATION:  IDA  ASSESSMENT & PLAN:   Assessment and Plan Assessment & Plan Severe iron deficiency anemia Hemoglobin at 7.8 g/dL, likely due to gastrointestinal bleeding. Symptoms suggest significant blood loss over months.  - Ordered blood work to assess current hemoglobin levels. - CBC today showed Hb at 7.9, no indication for blood transfusion today. - Schedule blood transfusion for Monday, we will request 1 unit of PRBC - Administer iron infusions in the first week of December. Ordered ferraheme - Advised starting over-the-counter iron pills once daily. - Coordinated with gastroenterology for earlier evaluation and potential endoscopy. Sent an inbasket.  History of rectal cancer, status post resection with colostomy Rectal cancer resection with colostomy in 2014. Current anemia likely related to gastrointestinal bleeding  - Coordinated with gastroenterology for evaluation of potential bleeding source.  Chronic anticoagulation therapy On Xarelto  for chronic anticoagulation. Increased bleeding risk due to anticoagulation therapy. Since there is no obvious bleeding going on at this time and since he has afib, he should continue anticoagulation until we have a def source of bleeding.   HISTORY OF PRESENTING ILLNESS:  Cody Coleman 78 y.o. male is here because of IDA  Discussed the use of AI scribe software for clinical note transcription with the patient, who gave verbal consent to proceed.  History of Present  Illness Cody Coleman is a 78 year old male with rectal cancer who presents with severe anemia.  He has been experiencing extreme fatigue, shortness of breath, and dizziness upon standing. Walking short distances, such as from the parking lot to the clinic, leaves him breathless. He also feels cold, and these symptoms have progressively worsened over the past six months. Previously active, he now finds simple tasks exhausting.  He has a history of rectal cancer diagnosed in 2014, treated with surgery. He has a colostomy bag and occasionally notices slight bleeding around the flange of the colostomy patch, but no melena since discontinuing iron tablets a few years ago. He was previously on iron tablets, which caused melena, but they were stopped. No blood has been seen in his colostomy bag recently.  He experiences confusion and difficulty thinking clearly, attributing this to fatigue. No urine discoloration. He is on Xarelto , a blood thinner, and has noticed skin dryness and bleeding from minor scratches during colder months.  He has a history of two hernias following his cancer surgery, which were repaired. He does not currently have a primary care physician as his previous doctor retired and closed advice worker. He is seeking to establish care with a new provider for maintenance medications. All other systems were reviewed with the patient and are negative.  MEDICAL HISTORY:  Past Medical History:  Diagnosis Date   Atrial fibrillation (HCC) 12/15/2016   BPH (benign prostatic hyperplasia)    CAD (coronary artery disease)    Cancer (HCC)    RECTAL CANCER--SOME RECTAL BLEEDING   COPD (chronic obstructive pulmonary disease) (HCC)    Emphysema lung (HCC)    Former tobacco use    High cholesterol    MI (myocardial infarction) (HCC)    x 2  AT AGE 40  AND AT AGE 89  DR. MCLEAN IS PT'S MEDICAL DOCTOR   Pain    RIGHT KNEE PAIN AND OCCAS OTHER JOINT PAINS   Sleep difficulties    NEVER SLEEPS  WELL - WAKES UP AFTER SEVERAL HOURS AND NOT ABLE TO GO BACK TO SLEEP--DID SLEEP STUDY AND TOLD HE DID NOT HAVE SLEEP APNEA   Urgency-frequency syndrome    FLOMAX  HAS HELPED    SURGICAL HISTORY: Past Surgical History:  Procedure Laterality Date   CARDIOVERSION N/A 10/21/2015   Procedure: CARDIOVERSION;  Surgeon: Ezra GORMAN Shuck, MD;  Location: [facility] ENDOSCOPY;  Service: Cardiovascular;  Laterality: N/A;   COLONOSCOPY N/A 04/20/2012   Procedure: COLONOSCOPY;  Surgeon: Claudis RAYMOND Rivet, MD;  Location: [facility] ENDO SUITE;  Service: Endoscopy;  Laterality: N/A;  225   COLONOSCOPY N/A 06/14/2013   Procedure: COLONOSCOPY;  Surgeon: Claudis RAYMOND Rivet, MD;  Location: [facility] ENDO SUITE;  Service: Endoscopy;  Laterality: N/A;  1245   COLOSTOMY REVISION N/A 01/17/2013   Procedure: COLOSTOMY REVISION;  Surgeon: Bernarda Ned, MD;  Location: [facility] OR;  Service: General;  Laterality: N/A;   CORONARY ANGIOPLASTY WITH STENT PLACEMENT     before bypass   CORONARY ARTERY BYPASS GRAFT     2005   EUS N/A 05/03/2012   Procedure: LOWER ENDOSCOPIC ULTRASOUND (EUS);  Surgeon: Toribio SHAUNNA Cedar, MD;  Location: [facility] ENDOSCOPY;  Service: Endoscopy;  Laterality: N/A;   INSERTION OF MESH N/A 10/25/2013   Procedure: INSERTION OF STRATTICE MESH;  Surgeon: Bernarda Ned, MD;  Location: [facility] ORS;  Service: General;  Laterality: N/A;   LAPAROSCOPIC ASSISTED ABDOMINAL PERINEAL RESECTION N/A 06/11/2012   Procedure: LAPAROSCOPIC ASSISTED ABDOMINAL PERINEAL RESECTION;  Surgeon: Bernarda Ned, MD;  Location: [facility] ORS;  Service: General;  Laterality: N/A;   LAPAROSCOPIC PARASTOMAL HERNIA N/A 10/25/2013   Procedure: ROBOTIC PARASTOMAL HERNIA REPAIR WITH STRATTICE MESH;  Surgeon: Bernarda Ned, MD;  Location: [facility] ORS;  Service: General;  Laterality: N/A;   PARASTOMAL HERNIA REPAIR N/A 12/14/2016   Procedure: PREPARE OF RECURRENT PERISTOMAL HERNIA WITH MESH WITH LAPAROSCOPIC LYSIS OF ADHESIONS, AND INCISIONAL HERNIA REPAIR WITH MESH;  Surgeon: Sheldon Standing, MD;   Location: [facility] ORS;  Service: General;  Laterality: N/A;   TEE WITHOUT CARDIOVERSION N/A 01/17/2018   Procedure: TRANSESOPHAGEAL ECHOCARDIOGRAM (TEE);  Surgeon: Shuck Ezra GORMAN, MD;  Location: [facility] ENDOSCOPY;  Service: Cardiovascular;  Laterality: N/A;   TONSILLECTOMY      SOCIAL HISTORY: Social History   Socioeconomic History   Marital status: Married    Spouse name: Not on file   Number of children: Not on file   Years of education: Not on file   Highest education level: Not on file  Occupational History   Not on file  Tobacco Use   Smoking status: Former    Current packs/day: 0.00    Average packs/day: 1 pack/day for 30.0 years (30.0 ttl pk-yrs)    Types: Cigarettes    Start date: 02/01/1972    Quit date: 01/31/2002    Years since quitting: 21.9   Smokeless tobacco: Never  Vaping Use   Vaping status: Never Used  Substance and Sexual Activity   Alcohol use: Yes    Comment: rarely   Drug use: No   Sexual activity: Never  Other Topics Concern   Not on file  Social History Narrative   Lives with wife.  Drives, no assist devices.   Social Drivers of Corporate Investment Banker Strain: Not on file  Food Insecurity: No Food  Insecurity (12/27/2023)   Hunger Vital Sign    Worried About Running Out of Food in the Last Year: Never true    Ran Out of Food in the Last Year: Never true  Transportation Needs: No Transportation Needs (12/27/2023)   PRAPARE - Administrator, Civil Service (Medical): No    Lack of Transportation (Non-Medical): No  Physical Activity: Not on file  Stress: Not on file  Social Connections: Not on file  Intimate Partner Violence: Not on file    FAMILY HISTORY: Family History  Problem Relation Age of Onset   Congestive Heart Failure Mother    Stroke Father    Coronary artery disease Father    Coronary artery disease Brother    Colon cancer Neg Hx     ALLERGIES:  is allergic to nsaids.  MEDICATIONS:  Current Outpatient Medications   Medication Sig Dispense Refill   acetaminophen  (TYLENOL ) 500 MG tablet Take 1,000 mg by mouth every 6 (six) hours as needed for mild pain (pain score 1-3).     albuterol  (PROVENTIL ) (2.5 MG/3ML) 0.083% nebulizer solution Take 2.5 mg by nebulization every 4 (four) hours as needed for wheezing or shortness of breath.     ALPRAZolam  (XANAX ) 1 MG tablet Take 0.5-1 mg by mouth 4 (four) times daily as needed for anxiety or sleep.     atorvastatin  (LIPITOR) 40 MG tablet TAKE 1 TABLET BY MOUTH DAILY 30 tablet 4   carvedilol  (COREG ) 3.125 MG tablet TAKE ONE TABLET BY MOUTH TWICE DAILY 60 tablet 1   Cholecalciferol (VITAMIN D) 50 MCG (2000 UT) CAPS Take 1 capsule by mouth daily.     empagliflozin  (JARDIANCE ) 10 MG TABS tablet Take 1 tablet (10 mg total) by mouth daily before breakfast. 90 tablet 1   furosemide  (LASIX ) 40 MG tablet Take 2.5 tablets (100 mg total) by mouth 2 (two) times daily. 150 tablet 5   losartan  (COZAAR ) 25 MG tablet Take 1 tablet (25 mg total) by mouth daily. 90 tablet 3   potassium chloride  SA (KLOR-CON  M) 20 MEQ tablet Take 1 tablet (20 mEq total) by mouth 2 (two) times daily. 60 tablet 5   rivaroxaban  (XARELTO ) 20 MG TABS tablet Take 1 tablet (20 mg total) by mouth daily with supper. 30 tablet 2   tamsulosin  (FLOMAX ) 0.4 MG CAPS Take 0.4 mg by mouth daily after breakfast.      No current facility-administered medications for this visit.     PHYSICAL EXAMINATION: ECOG PERFORMANCE STATUS: 1 - Symptomatic but completely ambulatory  Vitals:   12/29/23 1106  BP: (!) 125/54  Pulse: 71  Resp: 18  Temp: 98 F (36.7 C)  SpO2: 100%   Filed Weights   12/29/23 1106  Weight: 190 lb 8 oz (86.4 kg)    GENERAL:alert, no distress and comfortable SKIN: skin color, texture, turgor are normal, no rashes or significant lesions EYES: normal, conjunctiva are pink and non-injected, sclera clear OROPHARYNX:no exudate, no erythema and lips, buccal mucosa, and tongue normal  NECK: supple,  thyroid  normal size, non-tender, without nodularity LYMPH:  no palpable lymphadenopathy in the cervical, axillary  LUNGS: clear to auscultation and percussion with normal breathing effort HEART: regular rate & rhythm and no murmurs and no lower extremity edema ABDOMEN:abdomen soft, non-tender and normal bowel sounds Musculoskeletal:no cyanosis of digits and no clubbing  PSYCH: alert & oriented x 3 with fluent speech NEURO: no focal motor/sensory deficits  LABORATORY DATA:  I have reviewed the data as listed Lab  Results  Component Value Date   WBC 5.1 12/25/2023   HGB 7.8 (L) 12/25/2023   HCT 26.2 (L) 12/25/2023   MCV 81.1 12/25/2023   PLT 185 12/25/2023     Chemistry      Component Value Date/Time   NA 140 12/25/2023 0941   K 4.2 12/25/2023 0941   CL 103 12/25/2023 0941   CO2 27 12/25/2023 0941   BUN 28 (H) 12/25/2023 0941   CREATININE 1.43 (H) 12/25/2023 0941   CREATININE 0.77 11/18/2015 1454      Component Value Date/Time   CALCIUM  8.7 (L) 12/25/2023 0941   ALKPHOS 54 03/21/2023 1510   AST 47 (H) 03/21/2023 1510   ALT 36 03/21/2023 1510   BILITOT 1.0 03/21/2023 1510       RADIOGRAPHIC STUDIES: I have personally reviewed the radiological images as listed and agreed with the findings in the report. No results found.  All questions were answered. The patient knows to call the clinic with any problems, questions or concerns. I spent 45 minutes in the care of this patient including H and P, review of records, counseling and coordination of care.     Amber Stalls, MD 12/29/2023 11:12 AM

## 2024-01-01 ENCOUNTER — Other Ambulatory Visit: Payer: Self-pay

## 2024-01-01 ENCOUNTER — Other Ambulatory Visit: Payer: Self-pay | Admitting: Hematology and Oncology

## 2024-01-01 ENCOUNTER — Inpatient Hospital Stay: Attending: Hematology and Oncology

## 2024-01-01 VITALS — BP 100/50 | HR 51 | Temp 97.7°F | Resp 18

## 2024-01-01 DIAGNOSIS — Z85048 Personal history of other malignant neoplasm of rectum, rectosigmoid junction, and anus: Secondary | ICD-10-CM | POA: Diagnosis present

## 2024-01-01 DIAGNOSIS — Z79899 Other long term (current) drug therapy: Secondary | ICD-10-CM | POA: Diagnosis not present

## 2024-01-01 DIAGNOSIS — D509 Iron deficiency anemia, unspecified: Secondary | ICD-10-CM | POA: Diagnosis present

## 2024-01-01 DIAGNOSIS — D582 Other hemoglobinopathies: Secondary | ICD-10-CM

## 2024-01-01 LAB — PREPARE RBC (CROSSMATCH)

## 2024-01-01 MED ORDER — FAMOTIDINE IN NACL 20-0.9 MG/50ML-% IV SOLN
20.0000 mg | Freq: Once | INTRAVENOUS | Status: AC
  Administered 2024-01-01: 20 mg via INTRAVENOUS
  Filled 2024-01-01: qty 50

## 2024-01-01 MED ORDER — SODIUM CHLORIDE 0.9% IV SOLUTION
250.0000 mL | INTRAVENOUS | Status: DC
  Administered 2024-01-01: 250 mL via INTRAVENOUS

## 2024-01-01 MED ORDER — SODIUM CHLORIDE 0.9 % IV SOLN: INTRAVENOUS | Status: DC

## 2024-01-01 MED ORDER — ACETAMINOPHEN 325 MG PO TABS
650.0000 mg | ORAL_TABLET | Freq: Once | ORAL | Status: AC
  Administered 2024-01-01: 650 mg via ORAL
  Filled 2024-01-01: qty 2

## 2024-01-01 MED ORDER — SODIUM CHLORIDE 0.9 % IV SOLN
510.0000 mg | Freq: Once | INTRAVENOUS | Status: AC
  Administered 2024-01-01: 510 mg via INTRAVENOUS
  Filled 2024-01-01: qty 510

## 2024-01-01 MED ORDER — DIPHENHYDRAMINE HCL 25 MG PO CAPS
25.0000 mg | ORAL_CAPSULE | Freq: Once | ORAL | Status: AC
  Administered 2024-01-01: 25 mg via ORAL
  Filled 2024-01-01: qty 1

## 2024-01-01 NOTE — Patient Instructions (Signed)
 Blood Transfusion, Adult A blood transfusion is a procedure in which you receive blood or a type of blood cell (blood component) through an IV. You may need a blood transfusion when you have a low blood count, which is a low number of any blood cell. This may result from a bleeding disorder, illness, injury, or surgery. The blood may come from a donor, or you may be able to have your own blood collected and stored (autologous blood donation) before a planned surgery. The blood given in a transfusion may be made up of different blood components. You may receive: Red blood cells. These carry oxygen to the cells in the body. Platelets. These help your blood to clot. Plasma. This is the liquid part of your blood. It carries proteins and other substances throughout the body. White blood cells. These help you fight infections. If you have hemophilia or another clotting disorder, you may also receive other types of blood products. Depending on the type of blood product, this procedure may take 1-4 hours to complete. Tell a health care provider about: Any bleeding problems you have. Any previous reactions you have had during a blood transfusion. Any allergies you have. All medicines you are taking, including vitamins, herbs, eye drops, creams, and over-the-counter medicines. Any surgeries you have had. Any medical conditions you have. Whether you are pregnant or may be pregnant. What are the risks? Talk with your health care provider about risks. The most common problems include: A mild allergic reaction, such as red, swollen areas of skin (hives) and itching. Fever or chills. This may be the body's response to new blood cells received. This may occur during or up to 4 hours after the transfusion. More serious problems may include: A serious allergic reaction that causes difficulty breathing or swelling around the face and lips. Transfusion-associated circulatory overload (TACO), or too much fluid in  the lungs. This may cause breathing problems. Transfusion-related acute lung injury (TRALI), which causes breathing difficulty and low oxygen in the blood. This can occur within hours of the transfusion or several days later. Iron overload. This can happen after receiving many blood transfusions over a period of time. Infection or virus being transmitted. This is rare because donated blood is carefully tested before it is given. Hemolytic transfusion reaction. This is rare. It happens when the body's defense system (immune system)tries to attack the new blood cells. Symptoms may include fever, chills, nausea, low blood pressure, and low back or chest pain. Transfusion-associated graft-versus-host disease (TAGVHD). This is rare. It happens when donated cells attack the body's healthy tissues. What happens before the procedure? You will have a blood test to check your blood type. This test is done to know what kind of blood your body will accept and to match it to the donor blood. If you are going to have a planned surgery, you may be able to do an autologous blood donation. This may be done in case you need to have a transfusion. You will have your temperature, blood pressure, and pulse checked before the transfusion. If you have had an allergic reaction to a transfusion in the past, you may be given medicine to help prevent a reaction. This medicine may be given to you by mouth (orally) or through an IV. What happens during the procedure?  An IV will be inserted into one of your veins. The bag of blood will be attached to your IV. The blood will then enter through your vein. Your temperature, blood pressure,  and pulse will be monitored during the transfusion. This monitoring is done to detect early signs of a transfusion reaction. Tell your nurse right away if you have any of these symptoms during the transfusion: Shortness of breath or trouble breathing. Chest or back pain. Fever or  chills. Itching or hives. If you have any signs or symptoms of a reaction, your transfusion will be stopped and you may be given medicine. When the transfusion is complete, your IV will be removed. Pressure may be applied to the IV site for a few minutes. A bandage (dressing)will be applied. The procedure may vary among health care providers and hospitals. What happens after the procedure? Your temperature, blood pressure, pulse, breathing rate, and blood oxygen level will be monitored until you leave the hospital or clinic. Your blood may be tested to see how you have responded to the transfusion. You may be warmed with fluids or blankets to maintain a normal body temperature. If you receive your blood transfusion in an outpatient setting, you will be told whom to contact to report any reactions. Where to find more information Visit the [employer]: redcross.org Summary A blood transfusion is a procedure in which you receive blood or a type of blood cell (blood component) through an IV. The blood given in a transfusion may be made up of different blood components. You may receive red blood cells, platelets, plasma, or white blood cells depending on the condition treated. Your temperature, blood pressure, and pulse will be monitored before, during, and after the transfusion. After the transfusion, your blood may be tested to see how your body has responded. This information is not intended to replace advice given to you by your health care provider. Make sure you discuss any questions you have with your health care provider. Document Revised: 04/16/2021 Document Reviewed: 04/16/2021 Elsevier Patient Education  2024 [employer].

## 2024-01-02 LAB — BPAM RBC
Blood Product Expiration Date: 202512192359
ISSUE DATE / TIME: 202512010933
Unit Type and Rh: 7300

## 2024-01-02 LAB — TYPE AND SCREEN
ABO/RH(D): B POS
Antibody Screen: NEGATIVE
Unit division: 0

## 2024-01-03 ENCOUNTER — Other Ambulatory Visit: Payer: Self-pay | Admitting: Hematology and Oncology

## 2024-01-05 ENCOUNTER — Encounter: Payer: Self-pay | Admitting: Gastroenterology

## 2024-01-05 ENCOUNTER — Telehealth: Payer: Self-pay

## 2024-01-05 ENCOUNTER — Encounter (HOSPITAL_COMMUNITY)

## 2024-01-05 ENCOUNTER — Ambulatory Visit: Admitting: Gastroenterology

## 2024-01-05 VITALS — BP 100/70 | HR 62 | Ht 68.0 in | Wt 187.5 lb

## 2024-01-05 DIAGNOSIS — D500 Iron deficiency anemia secondary to blood loss (chronic): Secondary | ICD-10-CM

## 2024-01-05 DIAGNOSIS — I482 Chronic atrial fibrillation, unspecified: Secondary | ICD-10-CM

## 2024-01-05 DIAGNOSIS — Z85048 Personal history of other malignant neoplasm of rectum, rectosigmoid junction, and anus: Secondary | ICD-10-CM

## 2024-01-05 DIAGNOSIS — I5022 Chronic systolic (congestive) heart failure: Secondary | ICD-10-CM

## 2024-01-05 MED ORDER — NA SULFATE-K SULFATE-MG SULF 17.5-3.13-1.6 GM/177ML PO SOLN: 1.0000 | Freq: Once | ORAL | 0 refills | Status: AC

## 2024-01-05 NOTE — Progress Notes (Addendum)
 [facility] Consult Note:  History: [facility] 01/05/2024  Referring provider: Dr. Loretha (hematology)  Reason for consult/chief complaint: Anemia (Pt stated he is severely anemic and he has a GI bleed and does not know where its coming from. Pt states nothing is bothering him at this moment )   Subjective  Prior history:  Prior patient [facility], most recently seen there by telemedicine May 2022 for iron deficiency anemia (hemoglobin 8.0 at that time) Dr. Eartha recommended EGD and colonoscopy It appears these procedures were scheduled for 07/03/2020 but are listed as canceled (no reason indicated in available notes)  He had seen Dr. Teressa at this office for a diagnostic/staging EUS when his rectal cancer was discovered in 2014  Patient was referred to hematology after anemia discovered at labs ordered by cardiology visit on November 24  Saw Hematology 12/29/2023 after referral from primary care, received 1 unit PRBCs, as well as a dose of IV iron the same day, and instructed to take oral iron.  Discussed the use of AI scribe software for clinical note transcription with the patient, who gave verbal consent to proceed.  History of Present Illness [facility] is a 78 year old male with rectal cancer and anemia who presents with worsening anemia and iron deficiency.  Anemia and iron deficiency - Recurrent iron deficiency, with significant decline in blood counts between February and November of this year.  He may have had some labs in the interim at primary care, but they are not available in this EHR. - Significant fatigue, especially in the last six months - Dyspnea with mundane tasks - Leg pain, burning feet, and cold, numb fingertips - Temporary improvement in symptoms after recent blood transfusion and iron infusion - No visible bleeding or melena prior to resuming iron supplementation - Stool color brownish-tan, turning darker with  iron supplementation  Colostomy and peristomal symptoms - Permanent colostomy in place - Occasional irritation and minor bleeding around the flange during appliance changes Records indicate he had a significant parastomal and abdominal wall hernia which was then fixed surgically by Dr. Sheldon.  Oncologic and surgical history - History of rectal cancer - Proctectomy and multiple hernia repairs - Complications including abdominal wall infections post-surgery  Cardiovascular comorbidities - History of atrial fibrillation, coronary artery bypass grafting, and congestive heart failure - Cardiac conditions currently stable - Further laboratory evaluation scheduled with cardiologist  Prior gastrointestinal evaluation - Previous care at [facility] practice - Telemedicine visit in 2022 identified anemia and iron deficiency - Planned upper endoscopy and colonoscopy canceled due to lack of cardiology clearance for anesthesia (according to patient, something which frustrated greatly since he had seen his cardiologist several months before that endoscopy appointment) That appears to be the reason he did not return to that practice.  He and his wife say that his primary care provider was recommending and prescribing iron in the interim until he stopped doing that sometime earlier this year and then recently announced his retirement.  [facility] has not seen bloody or black stool, though his stool turned dark after recently starting oral iron.  ROS:  Review of Systems  Constitutional:  Positive for fatigue. Negative for appetite change and unexpected weight change.  HENT:  Negative for mouth sores and voice change.   Eyes:  Negative for pain and redness.  Respiratory:  Positive for shortness of breath. Negative for cough.   Cardiovascular:  Negative for chest pain and palpitations.  Genitourinary:  Negative for dysuria and hematuria.  Musculoskeletal:  Negative for arthralgias and myalgias.  Skin:   Negative for pallor and rash.  Neurological:  Negative for weakness and headaches.  Hematological:  Negative for adenopathy.     Past Medical History: Past Medical History:  Diagnosis Date   Atrial fibrillation (HCC) 12/15/2016   BPH (benign prostatic hyperplasia)    CAD (coronary artery disease)    Cancer (HCC)    RECTAL CANCER--SOME RECTAL BLEEDING   COPD (chronic obstructive pulmonary disease) (HCC)    Emphysema lung (HCC)    Former tobacco use    High cholesterol    MI (myocardial infarction) (HCC)    x 2  AT AGE 45 AND AT AGE 74  DR. MCLEAN IS PT'S MEDICAL DOCTOR   Pain    RIGHT KNEE PAIN AND OCCAS OTHER JOINT PAINS   Sleep difficulties    NEVER SLEEPS WELL - WAKES UP AFTER SEVERAL HOURS AND NOT ABLE TO GO BACK TO SLEEP--DID SLEEP STUDY AND TOLD HE DID NOT HAVE SLEEP APNEA   Urgency-frequency syndrome    FLOMAX  HAS HELPED   Cardiology visit 12/25/2023 78 y.o. with history of CAD s/p CABG and rectal cancer s/p abdominal perineal resection in 5/14 presents for cardiology followup.  He had CABG x 6 in 2005.  Stress tests later in 2005 and in 2008 were normal.  As above, he had APR in 5/14.  He had a complicated colostomy revision in 12/14.  After this operation, he developed abdominal wall cellulitis and septic shock, also in 12/14.  He was positive for influenza also.  His colostomy will be permanent. He had moved to [place] but has returned to live in [place].    At a prior appointment, he was noted to be in atrial fibrillation with rate control.  He did not realize that he was in atrial fibrillation.  Holter monitor showed persistent AF.  He looked volume overloaded on exam and Lasix  was started.  Echo in 8/17 showed EF 45-50%, diffuse hypokinesis.  He underwent DCCV 9/17 but did not hold NSR.  Lexiscan  Cardiolite in 10/17 showed EF 40%, lateral fixed defect, no ischemia.  Echo in 8/18 showed EF 40% with wall motion abnormalities.     Echo in 7/22 showed EF up to 50-55%,  mildly decreased RV systolic function.    Follow up 1/23, NYHA II, volume mildly up and instructed to increase Lasix  to 40 mg daily.   Echo 2/24 EF 50% with mild LVH, inferolateral hypokinesis, mildly decreased RV systolic function, PASP 37 mmHg.    Echo 7/25 showed EF 50-55%, RV low/normal   Today he returns for HF follow up with wife. Overall feeling fair.This summer was hard for him with being dizzy in the heat. Has had to slow down because he wears out easily. He is more SOB with manual labor and yardwork. Does OK with ADLs. Not sleeping well x decades. Feels fatigued, naps during the day. Denies palpitations, abnormal bleeding, CP, dizziness, edema, or PND/Orthopnea. Appetite ok. Weight at home 185 pounds. Taking all medications  Past Surgical History: Past Surgical History:  Procedure Laterality Date   CARDIOVERSION N/A 10/21/2015   Procedure: CARDIOVERSION;  Surgeon: Ezra GORMAN Shuck, MD;  Location[facility] ENDOSCOPY;  Service: Cardiovascular;  Laterality: N/A;   COLONOSCOPY N/A 04/20/2012   Procedure: COLONOSCOPY;  Surgeon: Claudis RAYMOND Rivet, MD;  Location: [facility] ENDO SUITE;  Service: Endoscopy;  Laterality: N/A;  225   COLONOSCOPY N/A 06/14/2013   Procedure: COLONOSCOPY;  Surgeon: Claudis RAYMOND Rivet, MD;  Location: [facility] ENDO SUITE;  Service: Endoscopy;  Laterality: N/A;  1245   COLOSTOMY REVISION N/A 01/17/2013   Procedure: COLOSTOMY REVISION;  Surgeon: Bernarda Ned, MD;  Location: [facility] OR;  Service: General;  Laterality: N/A;   CORONARY ANGIOPLASTY WITH STENT PLACEMENT     before bypass   CORONARY ARTERY BYPASS GRAFT     2005   EUS N/A 05/03/2012   Procedure: LOWER ENDOSCOPIC ULTRASOUND (EUS);  Surgeon: Toribio SHAUNNA Cedar, MD;  Location: [facility] ENDOSCOPY;  Service: Endoscopy;  Laterality: N/A;   INSERTION OF MESH N/A 10/25/2013   Procedure: INSERTION OF STRATTICE MESH;  Surgeon: Bernarda Ned, MD;  Location: [facility] ORS;  Service: General;  Laterality: N/A;   LAPAROSCOPIC ASSISTED ABDOMINAL PERINEAL RESECTION  N/A 06/11/2012   Procedure: LAPAROSCOPIC ASSISTED ABDOMINAL PERINEAL RESECTION;  Surgeon: Bernarda Ned, MD;  Location: [facility] ORS;  Service: General;  Laterality: N/A;   LAPAROSCOPIC PARASTOMAL HERNIA N/A 10/25/2013   Procedure: ROBOTIC PARASTOMAL HERNIA REPAIR WITH STRATTICE MESH;  Surgeon: Bernarda Ned, MD;  Location: [facility] ORS;  Service: General;  Laterality: N/A;   PARASTOMAL HERNIA REPAIR N/A 12/14/2016   Procedure: PREPARE OF RECURRENT PERISTOMAL HERNIA WITH MESH WITH LAPAROSCOPIC LYSIS OF ADHESIONS, AND INCISIONAL HERNIA REPAIR WITH MESH;  Surgeon: Sheldon Standing, MD;  Location: [facility] ORS;  Service: General;  Laterality: N/A;   TEE WITHOUT CARDIOVERSION N/A 01/17/2018   Procedure: TRANSESOPHAGEAL ECHOCARDIOGRAM (TEE);  Surgeon: Rolan Ezra RAMAN, MD;  Location: [facility] ENDOSCOPY;  Service: Cardiovascular;  Laterality: N/A;   TONSILLECTOMY       Family History: Family History  Problem Relation Age of Onset   Congestive Heart Failure Mother    Stroke Father    Coronary artery disease Father    Coronary artery disease Brother    Colon cancer Neg Hx     Social History: Social History   Socioeconomic History   Marital status: Married    Spouse name: Not on file   Number of children: Not on file   Years of education: Not on file   Highest education level: Not on file  Occupational History   Not on file  Tobacco Use   Smoking status: Former    Current packs/day: 0.00    Average packs/day: 1 pack/day for 30.0 years (30.0 ttl pk-yrs)    Types: Cigarettes    Start date: 02/01/1972    Quit date: 01/31/2002    Years since quitting: 21.9   Smokeless tobacco: Never  Vaping Use   Vaping status: Never Used  Substance and Sexual Activity   Alcohol use: Yes    Comment: rarely   Drug use: No   Sexual activity: Never  Other Topics Concern   Not on file  Social History Narrative   Lives with wife.  Drives, no assist devices.   Social Drivers of Corporate Investment Banker Strain: Not on file   Food Insecurity: No Food Insecurity (12/27/2023)   Hunger Vital Sign    Worried About Running Out of Food in the Last Year: Never true    Ran Out of Food in the Last Year: Never true  Transportation Needs: No Transportation Needs (12/27/2023)   PRAPARE - Administrator, Civil Service (Medical): No    Lack of Transportation (Non-Medical): No  Physical Activity: Not on file  Stress: Not on file  Social Connections: Not on file    Allergies: Allergies  Allergen Reactions   Nsaids Other (See Comments)  No NSAIDs while patient is on Xerelto anticoagulation    Outpatient Meds: Current Outpatient Medications  Medication Sig Dispense Refill   acetaminophen  (TYLENOL ) 500 MG tablet Take 1,000 mg by mouth every 6 (six) hours as needed for mild pain (pain score 1-3).     albuterol  (PROVENTIL ) (2.5 MG/3ML) 0.083% nebulizer solution Take 2.5 mg by nebulization every 4 (four) hours as needed for wheezing or shortness of breath.     ALPRAZolam  (XANAX ) 1 MG tablet Take 0.5-1 mg by mouth 4 (four) times daily as needed for anxiety or sleep.     atorvastatin  (LIPITOR) 40 MG tablet TAKE 1 TABLET BY MOUTH DAILY 30 tablet 4   carvedilol  (COREG ) 3.125 MG tablet TAKE ONE TABLET BY MOUTH TWICE DAILY 60 tablet 1   Cholecalciferol (VITAMIN D) 50 MCG (2000 UT) CAPS Take 1 capsule by mouth daily.     empagliflozin  (JARDIANCE ) 10 MG TABS tablet Take 1 tablet (10 mg total) by mouth daily before breakfast. 90 tablet 1   furosemide  (LASIX ) 40 MG tablet Take 2.5 tablets (100 mg total) by mouth 2 (two) times daily. 150 tablet 5   losartan  (COZAAR ) 25 MG tablet Take 1 tablet (25 mg total) by mouth daily. 90 tablet 3   Na Sulfate-K Sulfate-Mg Sulfate concentrate (SUPREP) 17.5-3.13-1.6 GM/177ML SOLN Take 1 kit (354 mLs total) by mouth once for 1 dose. 354 mL 0   potassium chloride  SA (KLOR-CON  M) 20 MEQ tablet Take 1 tablet (20 mEq total) by mouth 2 (two) times daily. 60 tablet 5   rivaroxaban  (XARELTO ) 20  MG TABS tablet Take 1 tablet (20 mg total) by mouth daily with supper. 30 tablet 2   tamsulosin  (FLOMAX ) 0.4 MG CAPS Take 0.4 mg by mouth daily after breakfast.      ferrous sulfate 325 (65 FE) MG EC tablet Take 325 mg by mouth daily with breakfast. (Patient not taking: Reported on 01/05/2024)     torsemide  (DEMADEX ) 20 MG tablet Take 20 mg by mouth daily. (Patient not taking: Reported on 01/05/2024)     traZODone (DESYREL) 100 MG tablet Take 100 mg by mouth at bedtime. (Patient not taking: Reported on 01/05/2024)     zolpidem (AMBIEN) 10 MG tablet Take 10 mg by mouth at bedtime as needed for sleep. (Patient not taking: Reported on 01/05/2024)     No current facility-administered medications for this visit.      ___________________________________________________________________ Objective   Exam:  BP 100/70   Pulse 62   Ht 5' 8 (1.727 m)   Wt 187 lb 8 oz (85 kg)   BMI 28.51 kg/m  Wt Readings from Last 3 Encounters:  01/05/24 187 lb 8 oz (85 kg)  12/29/23 190 lb 8 oz (86.4 kg)  12/25/23 190 lb 9.6 oz (86.5 kg)   Wife present for entire visit General: Well-appearing, speaking full sentences, ambulatory, pleasant and conversational Eyes: sclera anicteric, no redness ENT: oral mucosa moist without lesions, no cervical or supraclavicular lymphadenopathy CV: Regular, no JVD, no peripheral edema Resp: clear to auscultation bilaterally, normal RR and effort noted GI: soft, no focal tenderness, with active bowel sounds. No guarding or palpable organomegaly noted.  Left-sided ostomy without apparent parastomal hernia supine or standing.  Dark brown stool in the bag Skin; warm and dry, no rash or jaundice noted Neuro: awake, alert and oriented x 3. Normal gross motor function and fluent speech   Labs:     Latest Ref Rng & Units 12/29/2023   11:23 AM 12/25/2023  9:41 AM 03/21/2023    3:10 PM  CBC  WBC 4.0 - 10.5 K/uL 5.1  5.1  6.8   Hemoglobin 13.0 - 17.0 g/dL 7.9  7.8  86.0    Hematocrit 39.0 - 52.0 % 26.0  26.2  42.1   Platelets 150 - 400 K/uL 185  185  143       Latest Ref Rng & Units 12/25/2023    9:41 AM 06/23/2023    3:56 PM 03/21/2023    3:10 PM  CMP  Glucose 70 - 99 mg/dL 93  94  895   BUN 8 - 23 mg/dL 28  14  30    Creatinine 0.61 - 1.24 mg/dL 8.56  8.95  8.82   Sodium 135 - 145 mmol/L 140  139  142   Potassium 3.5 - 5.1 mmol/L 4.2  4.0  3.9   Chloride 98 - 111 mmol/L 103  98  101   CO2 22 - 32 mmol/L 27  29  27    Calcium  8.9 - 10.3 mg/dL 8.7  9.1  9.6   Total Protein 6.5 - 8.1 g/dL   8.1   Total Bilirubin 0.0 - 1.2 mg/dL   1.0   Alkaline Phos 38 - 126 U/L   54   AST 15 - 41 U/L   47   ALT 0 - 44 U/L   36      Radiologic Studies:  July 2025 echocardiogram  1. Left ventricular ejection fraction, by estimation, is 50 to 55%. The  left ventricle has low normal function. The left ventricle demonstrates  regional wall motion abnormalities (see scoring diagram/findings for  description). Left ventricular diastolic   function could not be evaluated. There is hypokinesis of the left  ventricular, basal inferolateral wall.   2. Right ventricular systolic function is low normal. The right  ventricular size is mildly enlarged.   3. Left atrial size was severely dilated.   4. Right atrial size was moderately dilated.   5. The mitral valve is normal in structure. Trivial mitral valve  regurgitation. No evidence of mitral stenosis.   6. The aortic valve is tricuspid. There is mild calcification of the  aortic valve. Aortic valve regurgitation is not visualized. No aortic  stenosis is present.   7. The inferior vena cava is dilated in size with >50% respiratory  variability, suggesting right atrial pressure of 8 mmHg.   Comparison(s): No significant change from prior study.    Encounter Diagnoses  Name Primary?   Iron deficiency anemia due to chronic blood loss Yes   History of rectal cancer    Chronic systolic CHF (congestive heart failure)      Chronic atrial fibrillation (HCC)     Assessment and Plan Assessment & Plan Iron deficiency anemia Chronic iron deficiency anemia likely due to chronic gastrointestinal blood loss, multiple potential causes.  Long overdue for surveillance colonoscopy with history of rectal cancer.  No localizing GI symptoms or overt GI blood loss.  Recent transfusion and IV iron improved symptoms. Hemoglobin critically low by November. - Scheduled upper endoscopy and colonoscopy for next available slot (which is in January) to identify blood loss source. - Ensure blood counts improve before procedures due to anesthesia risks, considering heart history. - Coordinate with cardiologist on holding blood thinners before procedures. - Consider pill camera study if endoscopy and colonoscopy are inconclusive. - Monitor blood counts with cardiology labs and adjust procedure timing.  He was agreeable to an EGD and colonoscopy after  a thorough discussion of procedure and risks  The benefits and risks of the planned procedure(s) were described in detail with the patient or (when appropriate) their health care proxy.  Risks were outlined as including, but not limited to, bleeding, infection, perforation, adverse medication reaction leading to cardiac or pulmonary decompensation, pancreatitis (if ERCP).  The limitation of incomplete mucosal visualization was also discussed.  No guarantees or warranties were given. Patient at high risk for cardiopulmonary complications of procedure due to medical comorbidities.    2-day hold of Xarelto  needed, and I expect his cardiologist will likely be agreeable.  Will check with them for clearance.  He understands the small but real risk of stroke during the brief OAC hold. He is appropriate for outpatient endoscopic care and we will have this chart reviewed by anesthesia per usual protocol.   Thank you for the courtesy of this consult.  Please call me with any questions or  concerns.  Will communicate today's findings and note to his cardiology and hematology teams.  (He has not yet identified a new PCP since that physician's recent retirement)  Victory LITTIE Brand III  CC: Referring provider noted above

## 2024-01-05 NOTE — Patient Instructions (Addendum)
 You have been scheduled for an endoscopy and colonoscopy. Please follow the written instructions given to you at your visit today.  If you use inhalers (even only as needed), please bring them with you on the day of your procedure.  DO NOT TAKE 7 DAYS PRIOR TO TEST- Trulicity (dulaglutide) Ozempic, Wegovy (semaglutide) Mounjaro, Zepbound (tirzepatide) Bydureon Bcise (exanatide extended release)  DO NOT TAKE 1 DAY PRIOR TO YOUR TEST Rybelsus (semaglutide) Adlyxin (lixisenatide) Victoza (liraglutide) Byetta (exanatide) ___________________________________________________________________________  Rosine will be contacted by our office prior to your procedure for directions on holding your Xarelto .  If you do not hear from our office 1 week prior to your scheduled procedure, please call [phone] to discuss.    Thank you for trusting me with your gastrointestinal care!    Dr. Victory Legrand [facility]

## 2024-01-05 NOTE — Telephone Encounter (Signed)
 [facility] Pre-operative Risk Assessment     Request for surgical clearance:     Endoscopy Procedure  What type of surgery is being performed?     ECL  When is this surgery scheduled?     02/13/24  What type of clearance is required ?   Pharmacy  Are there any medications that need to be held prior to surgery and how long? Xarelto  2 days  Practice name and name of physician performing surgery?      [facility]  What is your office phone and fax number?      Phone- [phone]  Fax- [phone]  Anesthesia type (None, local, MAC, general) ?       MAC   Please route your response to Corean [place], [facility]

## 2024-01-05 NOTE — Telephone Encounter (Signed)
 Pt informed to hold Xarelto  and states understanding.

## 2024-01-05 NOTE — Telephone Encounter (Signed)
   Patient Name: Cody Coleman  DOB: 12/13/1945 MRN: [id number]  Primary Cardiologist: Dr. Rolan with [facility]  Chart reviewed as part of pre-operative protocol coverage. Received reply from [place], FNP, OK to proceed with GI procedures as planned from CV standpoint. She confirms they plan to follow BMET 12/8 and will note CrCl at that time as well (CrCl 52.7 by 11/24 lab). I also confirmed 2 day Xarelto  hold is OK from pharm standpoint. Will route to requesting Epic user and remove from preop box. Please call with questions.  Lasalle Abee N Lariza Cothron, PA-C 01/05/2024, 2:39 PM

## 2024-01-05 NOTE — Telephone Encounter (Signed)
 Will route to pharm for input on holding Xarelto .  Last CHF visit from 12/2023 reviewed, somewhat volume up at that time, Lasix  increased with plan for 6 month follow-up. Labs 11/24 demonstrated new worsening anemia and AKI. Anemia is what prompted GI eval, pending EGD and colonoscopy as requested. OV indicated plan for follow-up BMET but do not see completed by the patient. Will route to [facility] on medical aspect of proceeding with EGD/colonoscopy and input on getting patient back for BMET with their clinic. Jessica - - Please route response to P CV DIV PREOP (the pre-op pool). Thank you.

## 2024-01-08 ENCOUNTER — Telehealth: Payer: Self-pay | Admitting: Adult Health

## 2024-01-08 ENCOUNTER — Ambulatory Visit (HOSPITAL_COMMUNITY)
Admission: RE | Admit: 2024-01-08 | Discharge: 2024-01-08 | Disposition: A | Discharge status: Home / Self Care | Source: Ambulatory Visit | Attending: Internal Medicine | Admitting: Internal Medicine

## 2024-01-08 DIAGNOSIS — I5022 Chronic systolic (congestive) heart failure: Secondary | ICD-10-CM

## 2024-01-08 LAB — BASIC METABOLIC PANEL WITH GFR
Anion gap: 9 (ref 5–15)
BUN: 20 mg/dL (ref 8–23)
CO2: 31 mmol/L (ref 22–32)
Calcium: 9.1 mg/dL (ref 8.9–10.3)
Chloride: 102 mmol/L (ref 98–111)
Creatinine, Ser: 1.34 mg/dL — ABNORMAL HIGH (ref 0.61–1.24)
GFR, Estimated: 55 mL/min — ABNORMAL LOW (ref >60)
Glucose, Bld: 95 mg/dL (ref 70–99)
Potassium: 3.9 mmol/L (ref 3.5–5.1)
Sodium: 142 mmol/L (ref 135–145)

## 2024-01-08 NOTE — Telephone Encounter (Signed)
 I spoke with patient and due to weather, he states he will not be traveling on 01/09/2024 so I scheduled him for 01/15/2024.

## 2024-01-12 ENCOUNTER — Encounter (HOSPITAL_COMMUNITY)

## 2024-01-15 ENCOUNTER — Ambulatory Visit: Payer: Self-pay

## 2024-01-15 ENCOUNTER — Inpatient Hospital Stay

## 2024-01-15 ENCOUNTER — Encounter: Payer: Self-pay | Admitting: Adult Health

## 2024-01-15 ENCOUNTER — Inpatient Hospital Stay: Admitting: Adult Health

## 2024-01-15 ENCOUNTER — Other Ambulatory Visit: Payer: Self-pay

## 2024-01-15 DIAGNOSIS — D509 Iron deficiency anemia, unspecified: Secondary | ICD-10-CM

## 2024-01-15 LAB — CMP (CANCER CENTER ONLY)
ALT: 16 U/L (ref 0–44)
AST: 27 U/L (ref 15–41)
Albumin: 4.7 g/dL (ref 3.5–5.0)
Alkaline Phosphatase: 57 U/L (ref 38–126)
Anion gap: 10 (ref 5–15)
BUN: 22 mg/dL (ref 8–23)
CO2: 30 mmol/L (ref 22–32)
Calcium: 9.3 mg/dL (ref 8.9–10.3)
Chloride: 99 mmol/L (ref 98–111)
Creatinine: 1.31 mg/dL — ABNORMAL HIGH (ref 0.61–1.24)
GFR, Estimated: 56 mL/min — ABNORMAL LOW (ref >60)
Glucose, Bld: 95 mg/dL (ref 70–99)
Potassium: 4.9 mmol/L (ref 3.5–5.1)
Sodium: 139 mmol/L (ref 135–145)
Total Bilirubin: 0.5 mg/dL (ref 0.0–1.2)
Total Protein: 7.2 g/dL (ref 6.5–8.1)

## 2024-01-15 LAB — CBC WITH DIFFERENTIAL (CANCER CENTER ONLY)
Abs Immature Granulocytes: 0.01 K/uL (ref 0.00–0.07)
Basophils Absolute: 0 K/uL (ref 0.0–0.1)
Basophils Relative: 1 %
Eosinophils Absolute: 0.3 K/uL (ref 0.0–0.5)
Eosinophils Relative: 6 %
HCT: 32.6 % — ABNORMAL LOW (ref 39.0–52.0)
Hemoglobin: 10.2 g/dL — ABNORMAL LOW (ref 13.0–17.0)
Immature Granulocytes: 0 %
Lymphocytes Relative: 19 %
Lymphs Abs: 1 K/uL (ref 0.7–4.0)
MCH: 26.1 pg (ref 26.0–34.0)
MCHC: 31.3 g/dL (ref 30.0–36.0)
MCV: 83.4 fL (ref 80.0–100.0)
Monocytes Absolute: 0.7 K/uL (ref 0.1–1.0)
Monocytes Relative: 13 %
Neutro Abs: 3.1 K/uL (ref 1.7–7.7)
Neutrophils Relative %: 61 %
Platelet Count: 158 K/uL (ref 150–400)
RBC: 3.91 MIL/uL — ABNORMAL LOW (ref 4.22–5.81)
RDW: 21.7 % — ABNORMAL HIGH (ref 11.5–15.5)
WBC Count: 5.1 K/uL (ref 4.0–10.5)
nRBC: 0 % (ref 0.0–0.2)

## 2024-01-15 LAB — SAMPLE TO BLOOD BANK

## 2024-01-15 MED ORDER — FERROUS SULFATE 325 (65 FE) MG PO TBEC: 325.0000 mg | DELAYED_RELEASE_TABLET | Freq: Three times a day (TID) | ORAL | 1 refills | Status: DC

## 2024-01-15 NOTE — Progress Notes (Unsigned)
 [facility] Cancer Follow up:    [name], MD [address] [place] [address]   DIAGNOSIS: Iron deficiency anemia   SUMMARY OF HEMATOLOGIC HISTORY: History of rectal cancer, status post resection with colostomy Rectal cancer resection with colostomy in 2014. Current anemia likely related to gastrointestinal bleeding  - Coordinated with gastroenterology for evaluation of potential bleeding source.  Severe iron deficiency anemia, likely secondary to GI bleeding.  Evaluated by Dr. Loretha on 12/29/2023 Feraheme  and blood transfusion given on 01/01/2024 GI evaluation with Dr. Legrand on 01/05/2024, endoscopy and colonoscopy scheduled 02/13/2023   CURRENT THERAPY: oral iron, intermittent IV iron  INTERVAL HISTORY:  Discussed the use of AI scribe software for clinical note transcription with the patient, who gave verbal consent to proceed.  History of Present Illness NATHANAL Coleman is a 78 year old male with iron deficiency anemia and rectal cancer status post colostomy who presents for follow-up and management of persistent anemia after recent transfusion.  He was evaluated for iron deficiency anemia on December 29, 2023, and received a blood transfusion on January 01, 2024, for a hemoglobin of 7.9 g/dL. He had only brief symptom relief after transfusion and still has significant fatigue and intermittent orthostatic lightheadedness. He takes oral iron twice daily, has previously tolerated three times daily dosing without issues, and denies intolerance to iron.  He has not seen hematochezia or blood in colostomy output. He notes only minor bleeding around the colostomy flange from local irritation. His CEA was not elevated at rectal cancer diagnosis, and he has not had recent surveillance since his prior oncologist retired.  He has chronic insomnia with decades-long sleep disturbance and persistent fatigue, which he and his husband relate partly to poor  sleep and health-related anxiety. A prior sleep study was normal. He does not snore, reports peaceful sleep, but wakes easily and cannot fall back asleep regardless of total sleep time.     Patient Active Problem List   Diagnosis Date Noted   IDA (iron deficiency anemia) 06/23/2020   Recurrent incisional hernia with incarceration s/p lap repair w mesh 12/15/2016 12/15/2016   Chronic systolic CHF (congestive heart failure)  12/15/2016   Atrial fibrillation (HCC) 12/15/2016   Bradycardia - intermittent 12/15/2016   Chronic anticoagulation 12/15/2016   Sleep difficulties    CAD (coronary artery disease)    BPH (benign prostatic hyperplasia)    Parastomal hernia without obstruction or gangrene 12/14/2016   Recurrent Parastomal & Incisional hernias s/p lap repair w mesh 12/15/2016 11/22/2016   COPD exacerbation (HCC) 01/21/2013   Constipation, chronic 01/20/2013   Rectal cancer s/p APR/colostomy 06/11/2012 05/03/2012   High cholesterol 04/12/2012   CAD (coronary artery disease) of artery bypass graft 04/12/2012    is allergic to nsaids.  MEDICAL HISTORY: Past Medical History:  Diagnosis Date   Atrial fibrillation (HCC) 12/15/2016   BPH (benign prostatic hyperplasia)    CAD (coronary artery disease)    Cancer (HCC)    RECTAL CANCER--SOME RECTAL BLEEDING   COPD (chronic obstructive pulmonary disease) (HCC)    Emphysema lung (HCC)    Former tobacco use    High cholesterol    MI (myocardial infarction) (HCC)    x 2  AT AGE 47 AND AT AGE 44  DR. MCLEAN IS PT'S MEDICAL DOCTOR   Pain    RIGHT KNEE PAIN AND OCCAS OTHER JOINT PAINS   Sleep difficulties    NEVER SLEEPS WELL - WAKES UP AFTER SEVERAL HOURS AND NOT  ABLE TO GO BACK TO SLEEP--DID SLEEP STUDY AND TOLD HE DID NOT HAVE SLEEP APNEA   Urgency-frequency syndrome    FLOMAX  HAS HELPED    SURGICAL HISTORY: Past Surgical History:  Procedure Laterality Date   CARDIOVERSION N/A 10/21/2015   Procedure:  CARDIOVERSION;  Surgeon: Ezra GORMAN Shuck, MD;  Location: [facility] ENDOSCOPY;  Service: Cardiovascular;  Laterality: N/A;   COLONOSCOPY N/A 04/20/2012   Procedure: COLONOSCOPY;  Surgeon: Claudis RAYMOND Rivet, MD;  Location: [facility] ENDO SUITE;  Service: Endoscopy;  Laterality: N/A;  225   COLONOSCOPY N/A 06/14/2013   Procedure: COLONOSCOPY;  Surgeon: Claudis RAYMOND Rivet, MD;  Location: [facility] ENDO SUITE;  Service: Endoscopy;  Laterality: N/A;  1245   COLOSTOMY REVISION N/A 01/17/2013   Procedure: COLOSTOMY REVISION;  Surgeon: Bernarda Ned, MD;  Location: [facility] OR;  Service: General;  Laterality: N/A;   CORONARY ANGIOPLASTY WITH STENT PLACEMENT     before bypass   CORONARY ARTERY BYPASS GRAFT     2005   EUS N/A 05/03/2012   Procedure: LOWER ENDOSCOPIC ULTRASOUND (EUS);  Surgeon: Toribio SHAUNNA Cedar, MD;  Location: [facility];  Service: Endoscopy;  Laterality: N/A;   INSERTION OF MESH N/A 10/25/2013   Procedure: INSERTION OF STRATTICE MESH;  Surgeon: Bernarda Ned, MD;  Location: [facility] ORS;  Service: General;  Laterality: N/A;   LAPAROSCOPIC ASSISTED ABDOMINAL PERINEAL RESECTION N/A 06/11/2012   Procedure: LAPAROSCOPIC ASSISTED ABDOMINAL PERINEAL RESECTION;  Surgeon: Bernarda Ned, MD;  Location: [facility] ORS;  Service: General;  Laterality: N/A;   LAPAROSCOPIC PARASTOMAL HERNIA N/A 10/25/2013   Procedure: ROBOTIC PARASTOMAL HERNIA REPAIR WITH STRATTICE MESH;  Surgeon: Bernarda Ned, MD;  Location: [facility] ORS;  Service: General;  Laterality: N/A;   PARASTOMAL HERNIA REPAIR N/A 12/14/2016   Procedure: PREPARE OF RECURRENT PERISTOMAL HERNIA WITH MESH WITH LAPAROSCOPIC LYSIS OF ADHESIONS, AND INCISIONAL HERNIA REPAIR WITH MESH;  Surgeon: Sheldon Standing, MD;  Location: [facility] ORS;  Service: General;  Laterality: N/A;   TEE WITHOUT CARDIOVERSION N/A 01/17/2018   Procedure: TRANSESOPHAGEAL ECHOCARDIOGRAM (TEE);  Surgeon: Shuck Ezra GORMAN, MD;  Location: [facility] ENDOSCOPY;  Service: Cardiovascular;  Laterality: N/A;   TONSILLECTOMY      SOCIAL  HISTORY: Social History   Socioeconomic History   Marital status: Married    Spouse name: Not on file   Number of children: Not on file   Years of education: Not on file   Highest education level: Not on file  Occupational History   Not on file  Tobacco Use   Smoking status: Former    Current packs/day: 0.00    Average packs/day: 1 pack/day for 30.0 years (30.0 ttl pk-yrs)    Types: Cigarettes    Start date: 02/01/1972    Quit date: 01/31/2002    Years since quitting: 21.9   Smokeless tobacco: Never  Vaping Use   Vaping status: Never Used  Substance and Sexual Activity   Alcohol use: Yes    Comment: rarely   Drug use: No   Sexual activity: Never  Other Topics Concern   Not on file  Social History Narrative   Lives with wife.  Drives, no assist devices.   Social Drivers of Health   Tobacco Use: Medium Risk (12/25/2023)   Patient History    Smoking Tobacco Use: Former    Smokeless Tobacco Use: Never    Passive Exposure: Not on Actuary Strain: Not on file  Food Insecurity: No Food Insecurity (12/27/2023)   Epic    Worried About Running  Out of Food in the Last Year: Never true    Ran Out of Food in the Last Year: Never true  Transportation Needs: No Transportation Needs (12/27/2023)   Epic    Lack of Transportation (Medical): No    Lack of Transportation (Non-Medical): No  Physical Activity: Not on file  Stress: Not on file  Social Connections: Not on file  Intimate Partner Violence: Not on file  Depression (PHQ2-9): Low Risk (01/01/2024)   Depression (PHQ2-9)    PHQ-2 Score: 0  Alcohol Screen: Not on file  Housing: Low Risk (12/27/2023)   Epic    Unable to Pay for Housing in the Last Year: No    Number of Times Moved in the Last Year: 0    Homeless in the Last Year: No  Utilities: Not At Risk (12/27/2023)   Epic    Threatened with loss of utilities: No  Health Literacy: Not on file    FAMILY HISTORY: Family History   Problem Relation Age of Onset   Congestive Heart Failure Mother    Stroke Father    Coronary artery disease Father    Coronary artery disease Brother    Colon cancer Neg Hx     Review of Systems - Oncology    PHYSICAL EXAMINATION    There were no vitals filed for this visit.  Physical Exam  LABORATORY DATA:  CBC    Component Value Date/Time   WBC 5.1 12/29/2023 1123   RBC 3.27 (L) 12/29/2023 1123   HGB 7.9 (L) 12/29/2023 1123   HCT 26.0 (L) 12/29/2023 1123   PLT 185 12/29/2023 1123   MCV 79.5 (L) 12/29/2023 1123   MCH 24.2 (L) 12/29/2023 1123   MCHC 30.4 12/29/2023 1123   RDW 15.8 (H) 12/29/2023 1123   LYMPHSABS 0.8 12/29/2023 1123   MONOABS 0.7 12/29/2023 1123   EOSABS 0.3 12/29/2023 1123   BASOSABS 0.0 12/29/2023 1123    CMP     Component Value Date/Time   NA 142 01/08/2024 0857   K 3.9 01/08/2024 0857   CL 102 01/08/2024 0857   CO2 31 01/08/2024 0857   GLUCOSE 95 01/08/2024 0857   BUN 20 01/08/2024 0857   CREATININE 1.34 (H) 01/08/2024 0857   CREATININE 0.77 11/18/2015 1454   CALCIUM  9.1 01/08/2024 0857   PROT 8.1 03/21/2023 1510   ALBUMIN 4.8 03/21/2023 1510   AST 47 (H) 03/21/2023 1510   ALT 36 03/21/2023 1510   ALKPHOS 54 03/21/2023 1510   BILITOT 1.0 03/21/2023 1510   GFRNONAA 55 (L) 01/08/2024 0857   GFRAA >60 06/09/2018 1314     ASSESSMENT and THERAPY PLAN:   No problem-specific Assessment & Plan notes found for this encounter.   Assessment and Plan Assessment & Plan Iron deficiency anemia Chronic iron deficiency anemia with recent hemoglobin of 7.9 g/dL, requiring transfusion. Suspected ongoing gastrointestinal blood loss under investigation. Partial improvement post-transfusion. Objective: maintain hemoglobin stability prior to endoscopic evaluation. - Ordered repeat CBC to assess current hemoglobin. - Instructed to titrate oral iron to three times daily as tolerated, with option to increase from twice daily if well  tolerated. - Advised to monitor for worsening symptoms of anemia and seek laboratory evaluation if symptoms worsen. - Planned to communicate lab results and determine further management based on findings. - Deferred iron studies due to recent IV iron administration. - Goal: maintain hematologic stability and avoid hospitalization prior to scheduled endoscopy and colonoscopy on February 13, 2024.  History of rectal cancer with  colostomy Rectal cancer with colostomy. No current rectal bleeding or concerning colostomy symptoms. No history of elevated CEA at diagnosis. Surveillance and coordination with other providers ongoing. - Confirmed absence of rectal bleeding or concerning colostomy symptoms. - Reviewed that CEA was not elevated at diagnosis; no indication for repeat testing at this time. - Reinforced ongoing communication with primary care and specialty providers and ensured records are available.      All questions were answered. The patient knows to call the clinic with any problems, questions or concerns. We can certainly see the patient much sooner if necessary.  Total encounter time:*** minutes*in face-to-face visit time, chart review, lab review, care coordination, order entry, and documentation of the encounter time.    Morna Kendall, NP 01/15/2024 12:54 PM Medical Oncology and Hematology [facility] [address] [place], [place] [address] Tel. [phone]    Fax. [phone]  *Total Encounter Time as defined by the Centers for Medicare and Medicaid Services includes, in addition to the face-to-face time of a patient visit (documented in the note above) non-face-to-face time: obtaining and reviewing outside history, ordering and reviewing medications, tests or procedures, care coordination (communications with other health care professionals or caregivers) and documentation in the medical record.

## 2024-01-17 ENCOUNTER — Encounter: Payer: Self-pay | Admitting: Hematology and Oncology

## 2024-01-17 ENCOUNTER — Encounter (HOSPITAL_COMMUNITY): Payer: Self-pay | Admitting: Family Medicine

## 2024-01-22 ENCOUNTER — Inpatient Hospital Stay

## 2024-01-22 DIAGNOSIS — D509 Iron deficiency anemia, unspecified: Secondary | ICD-10-CM

## 2024-01-22 LAB — CBC WITH DIFFERENTIAL (CANCER CENTER ONLY)
Abs Immature Granulocytes: 0.01 K/uL (ref 0.00–0.07)
Basophils Absolute: 0 K/uL (ref 0.0–0.1)
Basophils Relative: 1 %
Eosinophils Absolute: 0.2 K/uL (ref 0.0–0.5)
Eosinophils Relative: 4 %
HCT: 34.2 % — ABNORMAL LOW (ref 39.0–52.0)
Hemoglobin: 11 g/dL — ABNORMAL LOW (ref 13.0–17.0)
Immature Granulocytes: 0 %
Lymphocytes Relative: 16 %
Lymphs Abs: 1 K/uL (ref 0.7–4.0)
MCH: 27 pg (ref 26.0–34.0)
MCHC: 32.2 g/dL (ref 30.0–36.0)
MCV: 83.8 fL (ref 80.0–100.0)
Monocytes Absolute: 0.8 K/uL (ref 0.1–1.0)
Monocytes Relative: 13 %
Neutro Abs: 4.1 K/uL (ref 1.7–7.7)
Neutrophils Relative %: 66 %
Platelet Count: 177 K/uL (ref 150–400)
RBC: 4.08 MIL/uL — ABNORMAL LOW (ref 4.22–5.81)
RDW: 21.8 % — ABNORMAL HIGH (ref 11.5–15.5)
WBC Count: 6.1 K/uL (ref 4.0–10.5)
nRBC: 0 % (ref 0.0–0.2)

## 2024-02-01 ENCOUNTER — Encounter: Payer: Self-pay | Admitting: Hematology and Oncology

## 2024-02-01 ENCOUNTER — Encounter (HOSPITAL_COMMUNITY): Payer: Self-pay | Admitting: Family Medicine

## 2024-02-05 ENCOUNTER — Encounter: Payer: Self-pay | Admitting: Hematology and Oncology

## 2024-02-05 ENCOUNTER — Encounter (HOSPITAL_COMMUNITY): Payer: Self-pay | Admitting: Family Medicine

## 2024-02-05 ENCOUNTER — Encounter: Payer: Self-pay | Admitting: Adult Health

## 2024-02-05 ENCOUNTER — Inpatient Hospital Stay

## 2024-02-05 ENCOUNTER — Inpatient Hospital Stay: Attending: Adult Health | Admitting: Adult Health

## 2024-02-05 VITALS — BP 114/58 | HR 98 | Temp 97.9°F | Resp 16 | Ht 68.0 in | Wt 183.9 lb

## 2024-02-05 DIAGNOSIS — C20 Malignant neoplasm of rectum: Secondary | ICD-10-CM

## 2024-02-05 DIAGNOSIS — D509 Iron deficiency anemia, unspecified: Secondary | ICD-10-CM

## 2024-02-05 LAB — CBC WITH DIFFERENTIAL (CANCER CENTER ONLY)
Abs Immature Granulocytes: 0.01 K/uL (ref 0.00–0.07)
Basophils Absolute: 0 K/uL (ref 0.0–0.1)
Basophils Relative: 1 %
Eosinophils Absolute: 0.3 K/uL (ref 0.0–0.5)
Eosinophils Relative: 5 %
HCT: 35.9 % — ABNORMAL LOW (ref 39.0–52.0)
Hemoglobin: 11.4 g/dL — ABNORMAL LOW (ref 13.0–17.0)
Immature Granulocytes: 0 %
Lymphocytes Relative: 18 %
Lymphs Abs: 1 K/uL (ref 0.7–4.0)
MCH: 27 pg (ref 26.0–34.0)
MCHC: 31.8 g/dL (ref 30.0–36.0)
MCV: 84.9 fL (ref 80.0–100.0)
Monocytes Absolute: 0.9 K/uL (ref 0.1–1.0)
Monocytes Relative: 16 %
Neutro Abs: 3.3 K/uL (ref 1.7–7.7)
Neutrophils Relative %: 60 %
Platelet Count: 160 K/uL (ref 150–400)
RBC: 4.23 MIL/uL (ref 4.22–5.81)
RDW: 21.1 % — ABNORMAL HIGH (ref 11.5–15.5)
WBC Count: 5.4 K/uL (ref 4.0–10.5)
nRBC: 0 % (ref 0.0–0.2)

## 2024-02-05 LAB — SAMPLE TO BLOOD BANK

## 2024-02-05 NOTE — Progress Notes (Signed)
 [facility] Cancer Follow up:    Cody Satterfield, MD [address] [place] [address]   DIAGNOSIS: h/o rectal cancer, iron deficiency anemia   SUMMARY OF ONCOLOGIC HISTORY: History of rectal cancer, status post resection with colostomy Rectal cancer resection with colostomy in 2014. Current anemia likely related to gastrointestinal bleeding  - Coordinated with gastroenterology for evaluation of potential bleeding source.   Severe iron deficiency anemia, likely secondary to GI bleeding.  Evaluated by Dr. Loretha on 12/29/2023 Feraheme  and blood transfusion given on 01/01/2024 GI evaluation with Dr. Legrand on 01/05/2024, endoscopy and colonoscopy scheduled 02/13/2023  CURRENT THERAPY:  INTERVAL HISTORY:  Discussed the use of AI scribe software for clinical note transcription with the patient, who gave verbal consent to proceed.  History of Present Illness Cody Coleman is a 79 year old male with rectal cancer status post resection with colostomy who presents for evaluation of severe iron deficiency anemia and gastrointestinal bleeding.  Since hospitalization requiring transfusion and IV iron his hemoglobin has improved. He is scheduled for endoscopy and colonoscopy on February 13, 2024.  He is taking oral iron three times daily without gastrointestinal adverse effects. He is unsure if gastrointestinal bleeding persists but notes improved energy and activity tolerance and no longer needs daytime naps. Hemoglobin has improved to 11.4 g/dL. He follows a diet low in red meat and high in fruit.  Sleep is good, and he denies new or worsening symptoms since the last visit.     Patient Active Problem List   Diagnosis Date Noted   IDA (iron deficiency anemia) 06/23/2020   Recurrent incisional hernia with incarceration s/p lap repair w mesh 12/15/2016 12/15/2016   Chronic systolic CHF (congestive heart failure)  12/15/2016   Atrial fibrillation (HCC) 12/15/2016    Bradycardia - intermittent 12/15/2016   Chronic anticoagulation 12/15/2016   Sleep difficulties    CAD (coronary artery disease)    BPH (benign prostatic hyperplasia)    Parastomal hernia without obstruction or gangrene 12/14/2016   Recurrent Parastomal & Incisional hernias s/p lap repair w mesh 12/15/2016 11/22/2016   COPD exacerbation (HCC) 01/21/2013   Constipation, chronic 01/20/2013   Rectal cancer s/p APR/colostomy 06/11/2012 05/03/2012   High cholesterol 04/12/2012   CAD (coronary artery disease) of artery bypass graft 04/12/2012    is allergic to nsaids.  MEDICAL HISTORY: Past Medical History:  Diagnosis Date   Atrial fibrillation (HCC) 12/15/2016   BPH (benign prostatic hyperplasia)    CAD (coronary artery disease)    Cancer (HCC)    RECTAL CANCER--SOME RECTAL BLEEDING   COPD (chronic obstructive pulmonary disease) (HCC)    Emphysema lung (HCC)    Former tobacco use    High cholesterol    MI (myocardial infarction) (HCC)    x 2  AT AGE 47 AND AT AGE 88  DR. MCLEAN IS PT'S MEDICAL DOCTOR   Pain    RIGHT KNEE PAIN AND OCCAS OTHER JOINT PAINS   Sleep difficulties    NEVER SLEEPS WELL - WAKES UP AFTER SEVERAL HOURS AND NOT ABLE TO GO BACK TO SLEEP--DID SLEEP STUDY AND TOLD HE DID NOT HAVE SLEEP APNEA   Urgency-frequency syndrome    FLOMAX  HAS HELPED    SURGICAL HISTORY: Past Surgical History:  Procedure Laterality Date   CARDIOVERSION N/A 10/21/2015   Procedure: CARDIOVERSION;  Surgeon: Ezra GORMAN Shuck, MD;  Location: [facility] ENDOSCOPY;  Service: Cardiovascular;  Laterality: N/A;   COLONOSCOPY N/A 04/20/2012   Procedure: COLONOSCOPY;  Surgeon:  Claudis RAYMOND Rivet, MD;  Location: [facility] ENDO SUITE;  Service: Endoscopy;  Laterality: N/A;  225   COLONOSCOPY N/A 06/14/2013   Procedure: COLONOSCOPY;  Surgeon: Claudis RAYMOND Rivet, MD;  Location: [facility] ENDO SUITE;  Service: Endoscopy;  Laterality: N/A;  1245   COLOSTOMY REVISION N/A 01/17/2013   Procedure: COLOSTOMY REVISION;  Surgeon:  Bernarda Ned, MD;  Location: [facility] OR;  Service: General;  Laterality: N/A;   CORONARY ANGIOPLASTY WITH STENT PLACEMENT     before bypass   CORONARY ARTERY BYPASS GRAFT     2005   EUS N/A 05/03/2012   Procedure: LOWER ENDOSCOPIC ULTRASOUND (EUS);  Surgeon: Toribio SHAUNNA Cedar, MD;  Location: [facility];  Service: Endoscopy;  Laterality: N/A;   INSERTION OF MESH N/A 10/25/2013   Procedure: INSERTION OF STRATTICE MESH;  Surgeon: Bernarda Ned, MD;  Location: [facility] ORS;  Service: General;  Laterality: N/A;   LAPAROSCOPIC ASSISTED ABDOMINAL PERINEAL RESECTION N/A 06/11/2012   Procedure: LAPAROSCOPIC ASSISTED ABDOMINAL PERINEAL RESECTION;  Surgeon: Bernarda Ned, MD;  Location: [facility] ORS;  Service: General;  Laterality: N/A;   LAPAROSCOPIC PARASTOMAL HERNIA N/A 10/25/2013   Procedure: ROBOTIC PARASTOMAL HERNIA REPAIR WITH STRATTICE MESH;  Surgeon: Bernarda Ned, MD;  Location: [facility] ORS;  Service: General;  Laterality: N/A;   PARASTOMAL HERNIA REPAIR N/A 12/14/2016   Procedure: PREPARE OF RECURRENT PERISTOMAL HERNIA WITH MESH WITH LAPAROSCOPIC LYSIS OF ADHESIONS, AND INCISIONAL HERNIA REPAIR WITH MESH;  Surgeon: Sheldon Standing, MD;  Location: [facility] ORS;  Service: General;  Laterality: N/A;   TEE WITHOUT CARDIOVERSION N/A 01/17/2018   Procedure: TRANSESOPHAGEAL ECHOCARDIOGRAM (TEE);  Surgeon: Rolan Ezra RAMAN, MD;  Location: [facility] ENDOSCOPY;  Service: Cardiovascular;  Laterality: N/A;   TONSILLECTOMY      SOCIAL HISTORY: Social History   Socioeconomic History   Marital status: Married    Spouse name: Not on file   Number of children: Not on file   Years of education: Not on file   Highest education level: Not on file  Occupational History   Not on file  Tobacco Use   Smoking status: Former    Current packs/day: 0.00    Average packs/day: 1 pack/day for 30.0 years (30.0 ttl pk-yrs)    Types: Cigarettes    Start date: 02/01/1972    Quit date: 01/31/2002    Years since quitting: 22.0   Smokeless tobacco: Never   Vaping Use   Vaping status: Never Used  Substance and Sexual Activity   Alcohol use: Yes    Comment: rarely   Drug use: No   Sexual activity: Never  Other Topics Concern   Not on file  Social History Narrative   Lives with wife.  Drives, no assist devices.   Social Drivers of Health   Tobacco Use: Medium Risk (02/05/2024)   Patient History    Smoking Tobacco Use: Former    Smokeless Tobacco Use: Never    Passive Exposure: Not on file  Financial Resource Strain: Low Risk (02/05/2024)   Overall Financial Resource Strain (CARDIA)    Difficulty of Paying Living Expenses: Not hard at all  Food Insecurity: No Food Insecurity (02/05/2024)   Epic    Worried About Programme Researcher, Broadcasting/film/video in the Last Year: Never true    Ran Out of Food in the Last Year: Never true  Transportation Needs: No Transportation Needs (02/05/2024)   Epic    Lack of Transportation (Medical): No    Lack of Transportation (Non-Medical): No  Physical Activity: Inactive (02/05/2024)  Exercise Vital Sign    Days of Exercise per Week: 0 days    Minutes of Exercise per Session: 0 min  Stress: No Stress Concern Present (02/05/2024)   [employer] of Occupational Health - Occupational Stress Questionnaire    Feeling of Stress: Only a little  Social Connections: Moderately Isolated (02/05/2024)   Social Connection and Isolation Panel    Frequency of Communication with Friends and Family: Twice a week    Frequency of Social Gatherings with Friends and Family: Three times a week    Attends Religious Services: Never    Active Member of Clubs or Organizations: No    Attends Banker Meetings: Never    Marital Status: Married  Catering Manager Violence: Not At Risk (02/05/2024)   Epic    Fear of Current or Ex-Partner: No    Emotionally Abused: No    Physically Abused: No    Sexually Abused: No  Depression (PHQ2-9): Low Risk (02/05/2024)   Depression (PHQ2-9)    PHQ-2 Score: 0  Alcohol Screen: Low Risk (02/05/2024)    Alcohol Screen    Last Alcohol Screening Score (AUDIT): 0  Housing: Unknown (02/05/2024)   Epic    Unable to Pay for Housing in the Last Year: No    Number of Times Moved in the Last Year: Not on file    Homeless in the Last Year: No  Utilities: Not At Risk (02/05/2024)   Epic    Threatened with loss of utilities: No  Health Literacy: Adequate Health Literacy (02/05/2024)   B1300 Health Literacy    Frequency of need for help with medical instructions: Never    FAMILY HISTORY: Family History  Problem Relation Age of Onset   Congestive Heart Failure Mother    Stroke Father    Coronary artery disease Father    Coronary artery disease Brother    Colon cancer Neg Hx     Review of Systems  Constitutional:  Negative for appetite change, chills, fatigue, fever and unexpected weight change.  HENT:   Negative for hearing loss, lump/mass and trouble swallowing.   Eyes:  Negative for eye problems and icterus.  Respiratory:  Negative for chest tightness, cough and shortness of breath.   Cardiovascular:  Negative for chest pain, leg swelling and palpitations.  Gastrointestinal:  Negative for abdominal distention, abdominal pain, constipation, diarrhea, nausea and vomiting.  Endocrine: Negative for hot flashes.  Genitourinary:  Negative for difficulty urinating.   Musculoskeletal:  Negative for arthralgias.  Skin:  Negative for itching and rash.  Neurological:  Negative for dizziness, extremity weakness, headaches and numbness.  Hematological:  Negative for adenopathy. Does not bruise/bleed easily.  Psychiatric/Behavioral:  Negative for depression. The patient is not nervous/anxious.       PHYSICAL EXAMINATION   Onc Performance Status - 02/05/24 0937       ECOG Perf Status   ECOG Perf Status Restricted in physically strenuous activity but ambulatory and able to carry out work of a light or sedentary nature, e.g., light house work, office work      KPS SCALE   KPS % SCORE Able to carry  on normal activity, minor s/s of disease          Vitals:   02/05/24 0924  BP: (!) 114/58  Pulse: 98  Resp: 16  Temp: 97.9 F (36.6 C)  SpO2: 97%    Physical Exam Constitutional:      General: He is not in acute distress.  Appearance: Normal appearance. He is not toxic-appearing.  HENT:     Head: Normocephalic and atraumatic.     Mouth/Throat:     Mouth: Mucous membranes are moist.     Pharynx: Oropharynx is clear. No oropharyngeal exudate or posterior oropharyngeal erythema.  Eyes:     General: No scleral icterus. Cardiovascular:     Rate and Rhythm: Normal rate and regular rhythm.     Pulses: Normal pulses.     Heart sounds: Normal heart sounds.  Pulmonary:     Effort: Pulmonary effort is normal.     Breath sounds: Normal breath sounds.  Abdominal:     General: Abdomen is flat. Bowel sounds are normal. There is no distension.     Palpations: Abdomen is soft.     Tenderness: There is no abdominal tenderness.  Musculoskeletal:        General: No swelling.     Cervical back: Neck supple.  Lymphadenopathy:     Cervical: No cervical adenopathy.  Skin:    General: Skin is warm and dry.     Findings: No rash.  Neurological:     General: No focal deficit present.     Mental Status: He is alert.  Psychiatric:        Mood and Affect: Mood normal.        Behavior: Behavior normal.     LABORATORY DATA:  CBC    Component Value Date/Time   WBC 5.4 02/05/2024 0851   WBC 5.1 12/29/2023 1123   RBC 4.23 02/05/2024 0851   HGB 11.4 (L) 02/05/2024 0851   HCT 35.9 (L) 02/05/2024 0851   PLT 160 02/05/2024 0851   MCV 84.9 02/05/2024 0851   MCH 27.0 02/05/2024 0851   MCHC 31.8 02/05/2024 0851   RDW 21.1 (H) 02/05/2024 0851   LYMPHSABS 1.0 02/05/2024 0851   MONOABS 0.9 02/05/2024 0851   EOSABS 0.3 02/05/2024 0851   BASOSABS 0.0 02/05/2024 0851    CMP     Component Value Date/Time   NA 139 01/15/2024 1335   K 4.9 01/15/2024 1335   CL 99 01/15/2024 1335   CO2  30 01/15/2024 1335   GLUCOSE 95 01/15/2024 1335   BUN 22 01/15/2024 1335   CREATININE 1.31 (H) 01/15/2024 1335   CREATININE 0.77 11/18/2015 1454   CALCIUM  9.3 01/15/2024 1335   PROT 7.2 01/15/2024 1335   ALBUMIN 4.7 01/15/2024 1335   AST 27 01/15/2024 1335   ALT 16 01/15/2024 1335   ALKPHOS 57 01/15/2024 1335   BILITOT 0.5 01/15/2024 1335   GFRNONAA 56 (L) 01/15/2024 1335   GFRAA >60 06/09/2018 1314     ASSESSMENT and THERAPY PLAN:    Assessment and Plan Assessment & Plan Iron deficiency anemia secondary to gastrointestinal bleeding Iron deficiency anemia likely due to gastrointestinal bleeding. Hemoglobin improved to 11.4 g/dL post Feraheme and transfusion. Maintained on oral iron without adverse effects. No ongoing bleeding; improved energy and function. Endoscopic evaluation pending.  - Ordered CEA for malignancy surveillance at next lab appt follow up. - Continue oral iron three times daily - Follow-up with gastroenterology post endoscopy and colonoscopy to review results and determine management. - Repeat iron studies and hemoglobin post endoscopic evaluation. - Report any acute changes or symptoms of recurrent bleeding.  Rectal cancer, status post resection with colostomy Rectal cancer post resection with colostomy in 2014. No recurrence symptoms. Ongoing surveillance with CEA testing and endoscopic evaluation appropriate. - Ordered CEA for cancer surveillance. - Scheduled endoscopy and colonoscopy  for gastrointestinal evaluation and cancer surveillance. - Follow-up with oncology post endoscopic procedures to review findings and discuss management.   RTC in 2-3 weeks post endoscopy.     All questions were answered. The patient knows to call the clinic with any problems, questions or concerns. We can certainly see the patient much sooner if necessary.  Total encounter time:20 minutes*in face-to-face visit time, chart review, lab review, care coordination, order entry,  and documentation of the encounter time.    Morna Kendall, NP 02/05/2024 9:48 AM Medical Oncology and Hematology [facility] [address] [place], [place] [address] Tel. [phone]    Fax. [phone]  *Total Encounter Time as defined by the Centers for Medicare and Medicaid Services includes, in addition to the face-to-face time of a patient visit (documented in the note above) non-face-to-face time: obtaining and reviewing outside history, ordering and reviewing medications, tests or procedures, care coordination (communications with other health care professionals or caregivers) and documentation in the medical record.

## 2024-02-06 ENCOUNTER — Encounter: Payer: Self-pay | Admitting: Gastroenterology

## 2024-02-08 ENCOUNTER — Ambulatory Visit: Admitting: Physician Assistant

## 2024-02-13 ENCOUNTER — Encounter: Payer: Self-pay | Admitting: Gastroenterology

## 2024-02-13 ENCOUNTER — Ambulatory Visit: Admitting: Gastroenterology

## 2024-02-13 VITALS — BP 110/49 | HR 53 | Temp 97.9°F | Resp 18 | Ht 68.0 in | Wt 187.0 lb

## 2024-02-13 DIAGNOSIS — Z85048 Personal history of other malignant neoplasm of rectum, rectosigmoid junction, and anus: Secondary | ICD-10-CM | POA: Diagnosis not present

## 2024-02-13 DIAGNOSIS — K5520 Angiodysplasia of colon without hemorrhage: Secondary | ICD-10-CM | POA: Diagnosis not present

## 2024-02-13 DIAGNOSIS — D500 Iron deficiency anemia secondary to blood loss (chronic): Secondary | ICD-10-CM | POA: Diagnosis not present

## 2024-02-13 DIAGNOSIS — D122 Benign neoplasm of ascending colon: Secondary | ICD-10-CM | POA: Diagnosis not present

## 2024-02-13 DIAGNOSIS — D123 Benign neoplasm of transverse colon: Secondary | ICD-10-CM | POA: Diagnosis not present

## 2024-02-13 MED ORDER — SODIUM CHLORIDE 0.9 % IV SOLN: 500.0000 mL | Freq: Once | INTRAVENOUS | Status: DC

## 2024-02-13 NOTE — Op Note (Signed)
 [facility] Patient Name: Cody Coleman Procedure Date: 02/13/2024 1:17 PM MRN: [id number] Endoscopist: Victory L. Legrand , MD, [id number] Age: 79 Referring MD:  Date of Birth: 06-28-1945 Gender: Male Account #: [id number] Procedure:                Upper GI endoscopy Indications:              Unexplained iron deficiency anemia Medicines:                Monitored Anesthesia Care Procedure:                Pre-Anesthesia Assessment:                           - Prior to the procedure, a History and Physical                            was performed, and patient medications and                            allergies were reviewed. The patient's tolerance of                            previous anesthesia was also reviewed. The risks                            and benefits of the procedure and the sedation                            options and risks were discussed with the patient.                            All questions were answered, and informed consent                            was obtained. Prior Anticoagulants: The patient has                            taken Xarelto  (rivaroxaban ), last dose was 2 days                            prior to procedure. ASA Grade Assessment: III - A                            patient with severe systemic disease. After                            reviewing the risks and benefits, the patient was                            deemed in satisfactory condition to undergo the                            procedure.  After obtaining informed consent, the endoscope was                            passed under direct vision. Throughout the                            procedure, the patient's blood pressure, pulse, and                            oxygen saturations were monitored continuously. The                            Olympus scope [id number] was introduced through the                            mouth, and advanced to the second part of  duodenum.                            The upper GI endoscopy was accomplished without                            difficulty. The patient tolerated the procedure                            well. Scope In: Scope Out: Findings:                 The esophagus was normal.                           The stomach was normal.                           The cardia and gastric fundus were normal on                            retroflexion.                           The examined duodenum was normal. Complications:            No immediate complications. Estimated Blood Loss:     Estimated blood loss: none. Impression:               - Normal esophagus.                           - Normal stomach.                           - Normal examined duodenum.                           - No specimens collected. Recommendation:           - Patient has a contact number available for  emergencies. The signs and symptoms of potential                            delayed complications were discussed with the                            patient. Return to normal activities tomorrow.                            Written discharge instructions were provided to the                            patient.                           - Resume previous diet.                           - Resume Xarelto  (rivaroxaban ) at prior dose                            tomorrow.                           - See the other procedure note for documentation of                            additional recommendations.                           - To visualize the small bowel, perform video                            capsule endoscopy at appointment to be scheduled. Pal Shell L. Legrand, MD 02/13/2024 2:18:26 PM This report has been signed electronically.

## 2024-02-13 NOTE — Patient Instructions (Signed)
 Colonoscopy-Resume previous diet. Restart Xarelto  at prior dose tomorrow. No future surveillance colonoscopy recommended due to patients age and current guidelines. Upper Endoscopy-To visualize the small bowel, perform video capsule endoscopy at appointment to be scheduled.  YOU HAD AN ENDOSCOPIC PROCEDURE TODAY AT THE [facility]:   Refer to the procedure report that was given to you for any specific questions about what was found during the examination.  If the procedure report does not answer your questions, please call your gastroenterologist to clarify.  If you requested that your care partner not be given the details of your procedure findings, then the procedure report has been included in a sealed envelope for you to review at your convenience later.  YOU SHOULD EXPECT: Some feelings of bloating in the abdomen. Passage of more gas than usual.  Walking can help get rid of the air that was put into your GI tract during the procedure and reduce the bloating. If you had a lower endoscopy (such as a colonoscopy or flexible sigmoidoscopy) you may notice spotting of blood in your stool or on the toilet paper. If you underwent a bowel prep for your procedure, you may not have a normal bowel movement for a few days.  Please Note:  You might notice some irritation and congestion in your nose or some drainage.  This is from the oxygen used during your procedure.  There is no need for concern and it should clear up in a day or so.  SYMPTOMS TO REPORT IMMEDIATELY:  Following lower endoscopy (colonoscopy or flexible sigmoidoscopy):  Excessive amounts of blood in the stool  Significant tenderness or worsening of abdominal pains  Swelling of the abdomen that is new, acute  Fever of 100F or higher  Following upper endoscopy (EGD)  Vomiting of blood or coffee ground material  New chest pain or pain under the shoulder blades  Painful or persistently difficult swallowing  New shortness of  breath  Fever of 100F or higher  Black, tarry-looking stools  For urgent or emergent issues, a gastroenterologist can be reached at any hour by calling [phone]. Do not use MyChart messaging for urgent concerns.    DIET:  We do recommend a small meal at first, but then you may proceed to your regular diet.  Drink plenty of fluids but you should avoid alcoholic beverages for 24 hours.  ACTIVITY:  You should plan to take it easy for the rest of today and you should NOT DRIVE or use heavy machinery until tomorrow (because of the sedation medicines used during the test).    FOLLOW UP: Our staff will call the number listed on your records the next business day following your procedure.  We will call around 7:15- 8:00 am to check on you and address any questions or concerns that you may have regarding the information given to you following your procedure. If we do not reach you, we will leave a message.     If any biopsies were taken you will be contacted by phone or by letter within the next 1-3 weeks.  Please call us  at [phone] if you have not heard about the biopsies in 3 weeks.    SIGNATURES/CONFIDENTIALITY: You and/or your care partner have signed paperwork which will be entered into your electronic medical record.  These signatures attest to the fact that that the information above on your After Visit Summary has been reviewed and is understood.  Full responsibility of the confidentiality of this discharge information  lies with you and/or your care-partner.

## 2024-02-13 NOTE — Progress Notes (Signed)
 Called to room to assist during endoscopic procedure.  Patient ID and intended procedure confirmed with present staff. Received instructions for my participation in the procedure from the performing physician.

## 2024-02-13 NOTE — Progress Notes (Signed)
 Sedate, gd SR, tolerated procedure well, VSS, report to RN

## 2024-02-13 NOTE — Op Note (Signed)
 [facility] Patient Name: Cody Coleman Procedure Date: 02/13/2024 1:26 PM MRN: [id number] Endoscopist: Victory L. Legrand , MD, [id number] Age: 79 Referring MD:  Date of Birth: 1945-04-29 Gender: Male Account #: [id number] Procedure:                Colonoscopy Indications:              Unexplained iron deficiency anemia                           History of rectal cancer (clinical details in GI                            office note dated 01/05/2024) Procedure:                Pre-Anesthesia Assessment:                           - Prior to the procedure, a History and Physical                            was performed, and patient medications and                            allergies were reviewed. The patient's tolerance of                            previous anesthesia was also reviewed. The risks                            and benefits of the procedure and the sedation                            options and risks were discussed with the patient.                            All questions were answered, and informed consent                            was obtained. Prior Anticoagulants: The patient has                            taken Xarelto  (rivaroxaban ), last dose was 2 days                            prior to procedure. ASA Grade Assessment: III - A                            patient with severe systemic disease. After                            reviewing the risks and benefits, the patient was                            deemed in satisfactory condition to undergo the  procedure.                           After obtaining informed consent, the colonoscope                            was passed under direct vision. Throughout the                            procedure, the patient's blood pressure, pulse, and                            oxygen saturations were monitored continuously. The                            Colonoscope was introduced through the  descending                            colostomy and advanced to the the cecum, identified                            by appendiceal orifice and ileocecal valve. The                            colonoscopy was somewhat difficult due to a                            redundant colon. Successful completion of the                            procedure was aided by using manual pressure and                            straightening and shortening the scope to obtain                            bowel loop reduction. The patient tolerated the                            procedure well. The quality of the bowel                            preparation was adequate after extensive lavage of                            opaque liquid and debris. The ileocecal valve,                            appendiceal orifice and other areas were                            photographed. The terminal ileum could not be  intubated due to scope looping. Cecum was reached                            with some difficulty due to the anatomy it was                            challenging to maintain the scope position there. Scope In: 1:36:31 PM Scope Out: 1:56:42 PM Scope Withdrawal Time: 0 hours 13 minutes 24 seconds  Total Procedure Duration: 0 hours 20 minutes 11 seconds  Findings:                 A single nonbleeding angioectasia was seen in the                            cecum.                           A 4 mm polyp was found in the ascending colon. The                            polyp was sessile. The polyp was removed with a                            cold snare. Resection and retrieval were complete.                           A 2 mm polyp was found in the transverse colon. The                            polyp was sessile. The polyp was removed with a                            cold snare. Resection and retrieval were complete.                           Colostomy appeared healthy and was  photographed Complications:            No immediate complications. Estimated Blood Loss:     Estimated blood loss was minimal. Impression:               - A single non-bleeding colonic angioectasia.                           - One 4 mm polyp in the ascending colon, removed                            with a cold snare. Resected and retrieved.                           - One 2 mm polyp in the transverse colon, removed                            with a cold snare. Resected and retrieved.  Not clear to what extent a cecal AVM is                            contributing to iron-deficiency anemia. It is not                            friable and has no stigmata of bleeding. Recommendation:           - Patient has a contact number available for                            emergencies. The signs and symptoms of potential                            delayed complications were discussed with the                            patient. Return to normal activities tomorrow.                            Written discharge instructions were provided to the                            patient.                           - Resume previous diet.                           - Resume Xarelto  (rivaroxaban ) at prior dose                            tomorrow.                           - No future surveillance colonoscopy recommended                            due to patient's age and current guidelines.                           - See the other procedure note for documentation of                            additional recommendations. Dreshawn Hendershott L. Legrand, MD 02/13/2024 2:15:49 PM This report has been signed electronically.

## 2024-02-13 NOTE — Progress Notes (Signed)
 Pt reports that he was started on 50000IU of Vitamin D at his last PCP visit.

## 2024-02-13 NOTE — Progress Notes (Signed)
 History and Physical:  This patient presents for endoscopic testing for: Encounter Diagnoses  Name Primary?   Iron deficiency anemia due to chronic blood loss Yes   History of rectal cancer     79 year old man here today for endoscopic evaluation of iron deficiency anemia.  Patient is otherwise without complaints or active issues today.  This was outlined in my extensive office note dated 01/05/2024 with no significant clinical changes since then.  He has a history of surgery for rectal cancer and has a permanent left-sided colostomy.  Hematology/oncology has been following and treating this patient for his IDA.  He last saw them in the office on 02/05/2024, at which time blood work was done and his hemoglobin was 11.4, having been 11.0 on 01/22/2024 and 10.2 on 01/15/2024.   Past Medical History: Past Medical History:  Diagnosis Date   Atrial fibrillation (HCC) 12/15/2016   BPH (benign prostatic hyperplasia)    CAD (coronary artery disease)    Cancer (HCC)    RECTAL CANCER--SOME RECTAL BLEEDING   COPD (chronic obstructive pulmonary disease) (HCC)    Emphysema lung (HCC)    Former tobacco use    High cholesterol    MI (myocardial infarction) (HCC)    x 2  AT AGE 22 AND AT AGE 75  DR. MCLEAN IS PT'S MEDICAL DOCTOR   Pain    RIGHT KNEE PAIN AND OCCAS OTHER JOINT PAINS   Sleep difficulties    NEVER SLEEPS WELL - WAKES UP AFTER SEVERAL HOURS AND NOT ABLE TO GO BACK TO SLEEP--DID SLEEP STUDY AND TOLD HE DID NOT HAVE SLEEP APNEA   Urgency-frequency syndrome    FLOMAX  HAS HELPED     Past Surgical History: Past Surgical History:  Procedure Laterality Date   CARDIOVERSION N/A 10/21/2015   Procedure: CARDIOVERSION;  Surgeon: Ezra GORMAN Shuck, MD;  Location: [facility] ENDOSCOPY;  Service: Cardiovascular;  Laterality: N/A;   COLONOSCOPY N/A 04/20/2012   Procedure: COLONOSCOPY;  Surgeon: Claudis RAYMOND Rivet, MD;  Location: [facility] ENDO SUITE;  Service: Endoscopy;  Laterality: N/A;  225   COLONOSCOPY N/A  06/14/2013   Procedure: COLONOSCOPY;  Surgeon: Claudis RAYMOND Rivet, MD;  Location: [facility] ENDO SUITE;  Service: Endoscopy;  Laterality: N/A;  1245   COLOSTOMY REVISION N/A 01/17/2013   Procedure: COLOSTOMY REVISION;  Surgeon: Bernarda Ned, MD;  Location: [facility] OR;  Service: General;  Laterality: N/A;   CORONARY ANGIOPLASTY WITH STENT PLACEMENT     before bypass   CORONARY ARTERY BYPASS GRAFT     2005   EUS N/A 05/03/2012   Procedure: LOWER ENDOSCOPIC ULTRASOUND (EUS);  Surgeon: Toribio SHAUNNA Cedar, MD;  Location: [facility];  Service: Endoscopy;  Laterality: N/A;   INSERTION OF MESH N/A 10/25/2013   Procedure: INSERTION OF STRATTICE MESH;  Surgeon: Bernarda Ned, MD;  Location: [facility] ORS;  Service: General;  Laterality: N/A;   LAPAROSCOPIC ASSISTED ABDOMINAL PERINEAL RESECTION N/A 06/11/2012   Procedure: LAPAROSCOPIC ASSISTED ABDOMINAL PERINEAL RESECTION;  Surgeon: Bernarda Ned, MD;  Location: [facility] ORS;  Service: General;  Laterality: N/A;   LAPAROSCOPIC PARASTOMAL HERNIA N/A 10/25/2013   Procedure: ROBOTIC PARASTOMAL HERNIA REPAIR WITH STRATTICE MESH;  Surgeon: Bernarda Ned, MD;  Location: [facility] ORS;  Service: General;  Laterality: N/A;   PARASTOMAL HERNIA REPAIR N/A 12/14/2016   Procedure: PREPARE OF RECURRENT PERISTOMAL HERNIA WITH MESH WITH LAPAROSCOPIC LYSIS OF ADHESIONS, AND INCISIONAL HERNIA REPAIR WITH MESH;  Surgeon: Sheldon Standing, MD;  Location: [facility] ORS;  Service: General;  Laterality: N/A;   TEE  WITHOUT CARDIOVERSION N/A 01/17/2018   Procedure: TRANSESOPHAGEAL ECHOCARDIOGRAM (TEE);  Surgeon: Rolan Ezra RAMAN, MD;  Location: [facility] ENDOSCOPY;  Service: Cardiovascular;  Laterality: N/A;   TONSILLECTOMY      Allergies: Allergies[1]  Outpatient Meds: Current Outpatient Medications  Medication Sig Dispense Refill   ALPRAZolam  (XANAX ) 1 MG tablet Take 0.5-1 mg by mouth 4 (four) times daily as needed for anxiety or sleep.     atorvastatin  (LIPITOR) 40 MG tablet TAKE 1 TABLET BY MOUTH DAILY 30 tablet 4    carvedilol  (COREG ) 3.125 MG tablet TAKE ONE TABLET BY MOUTH TWICE DAILY 60 tablet 1   Cholecalciferol (VITAMIN D) 50 MCG (2000 UT) CAPS Take 1 capsule by mouth daily.     empagliflozin  (JARDIANCE ) 10 MG TABS tablet Take 1 tablet (10 mg total) by mouth daily before breakfast. 90 tablet 1   furosemide  (LASIX ) 40 MG tablet Take 2.5 tablets (100 mg total) by mouth 2 (two) times daily. 150 tablet 5   losartan  (COZAAR ) 25 MG tablet Take 1 tablet (25 mg total) by mouth daily. 90 tablet 3   potassium chloride  SA (KLOR-CON  M) 20 MEQ tablet Take 1 tablet (20 mEq total) by mouth 2 (two) times daily. 60 tablet 5   rivaroxaban  (XARELTO ) 20 MG TABS tablet Take 1 tablet (20 mg total) by mouth daily with supper. 30 tablet 2   tamsulosin  (FLOMAX ) 0.4 MG CAPS Take 0.4 mg by mouth daily after breakfast.      acetaminophen  (TYLENOL ) 500 MG tablet Take 1,000 mg by mouth every 6 (six) hours as needed for mild pain (pain score 1-3).     albuterol  (PROVENTIL ) (2.5 MG/3ML) 0.083% nebulizer solution Take 2.5 mg by nebulization every 4 (four) hours as needed for wheezing or shortness of breath.     ferrous sulfate  325 (65 FE) MG EC tablet Take 1 tablet (325 mg total) by mouth 3 (three) times daily with meals. 90 tablet 1   Current Facility-Administered Medications  Medication Dose Route Frequency Provider Last Rate Last Admin   0.9 %  sodium chloride  infusion  500 mL Intravenous Once Danis, Victory CROME III, MD          ___________________________________________________________________ Objective   Exam:  BP 131/60   Pulse (!) 49   Temp 97.9 F (36.6 C)   Ht 5' 8 (1.727 m)   Wt 187 lb (84.8 kg)   SpO2 97%   BMI 28.43 kg/m   CV: regular , S1/S2 Resp: clear to auscultation bilaterally, normal RR and effort noted GI: soft, no tenderness, with active bowel sounds. Left-sided colostomy     Latest Ref Rng & Units 02/05/2024    8:51 AM 01/22/2024   10:12 AM 01/15/2024    1:35 PM  CBC  WBC 4.0 - 10.5 K/uL 5.4   6.1  5.1   Hemoglobin 13.0 - 17.0 g/dL 88.5  88.9  89.7   Hematocrit 39.0 - 52.0 % 35.9  34.2  32.6   Platelets 150 - 400 K/uL 160  177  158     Assessment: Encounter Diagnoses  Name Primary?   Iron deficiency anemia due to chronic blood loss Yes   History of rectal cancer      Plan: Colonoscopy EGD  The benefits and risks of the planned procedure(s) were described in detail with the patient or (when appropriate) their health care proxy.  Risks were outlined as including, but not limited to, bleeding, infection, perforation, adverse medication reaction leading to cardiac or pulmonary decompensation, pancreatitis (  if ERCP).  The limitation of incomplete mucosal visualization was also discussed.  No guarantees or warranties were given.  The patient was provided an opportunity to ask questions and all were answered. The patient agreed with the plan.   The patient is appropriate for an endoscopic procedure in the ambulatory setting.   - Victory Brand, MD        [1]  Allergies Allergen Reactions   Nsaids Other (See Comments)    No NSAIDs while patient is on Xerelto anticoagulation

## 2024-02-14 ENCOUNTER — Telehealth: Payer: Self-pay

## 2024-02-14 NOTE — Telephone Encounter (Signed)
" °  Follow up Call-     02/13/2024   12:50 PM  Call back number  Post procedure Call Back phone  # [phone]  Permission to leave phone message Yes     Patient questions:  Do you have a fever, pain , or abdominal swelling? No. Pain Score  0 *  Have you tolerated food without any problems? Yes.    Have you been able to return to your normal activities? Yes.    Do you have any questions about your discharge instructions: Diet   No. Medications  No. Follow up visit  No.  Do you have questions or concerns about your Care? No.  Actions: * If pain score is 4 or above: No action needed, pain <4.   "

## 2024-02-15 ENCOUNTER — Other Ambulatory Visit: Payer: Self-pay | Admitting: Adult Health

## 2024-02-16 LAB — SURGICAL PATHOLOGY

## 2024-02-20 ENCOUNTER — Ambulatory Visit: Payer: Self-pay | Admitting: Gastroenterology

## 2024-02-29 ENCOUNTER — Inpatient Hospital Stay

## 2024-02-29 ENCOUNTER — Inpatient Hospital Stay (HOSPITAL_BASED_OUTPATIENT_CLINIC_OR_DEPARTMENT_OTHER): Admitting: Hematology and Oncology

## 2024-02-29 VITALS — BP 129/61 | HR 63 | Temp 98.1°F | Resp 18 | Wt 187.5 lb

## 2024-02-29 DIAGNOSIS — D509 Iron deficiency anemia, unspecified: Secondary | ICD-10-CM | POA: Diagnosis not present

## 2024-02-29 DIAGNOSIS — C20 Malignant neoplasm of rectum: Secondary | ICD-10-CM | POA: Diagnosis not present

## 2024-02-29 LAB — CBC WITH DIFFERENTIAL (CANCER CENTER ONLY)
Abs Immature Granulocytes: 0.02 10*3/uL (ref 0.00–0.07)
Basophils Absolute: 0 10*3/uL (ref 0.0–0.1)
Basophils Relative: 1 %
Eosinophils Absolute: 0.3 10*3/uL (ref 0.0–0.5)
Eosinophils Relative: 6 %
HCT: 35.9 % — ABNORMAL LOW (ref 39.0–52.0)
Hemoglobin: 11.7 g/dL — ABNORMAL LOW (ref 13.0–17.0)
Immature Granulocytes: 0 %
Lymphocytes Relative: 15 %
Lymphs Abs: 0.9 10*3/uL (ref 0.7–4.0)
MCH: 27.7 pg (ref 26.0–34.0)
MCHC: 32.6 g/dL (ref 30.0–36.0)
MCV: 85.1 fL (ref 80.0–100.0)
Monocytes Absolute: 0.7 10*3/uL (ref 0.1–1.0)
Monocytes Relative: 12 %
Neutro Abs: 3.9 10*3/uL (ref 1.7–7.7)
Neutrophils Relative %: 66 %
Platelet Count: 173 10*3/uL (ref 150–400)
RBC: 4.22 MIL/uL (ref 4.22–5.81)
RDW: 19.3 % — ABNORMAL HIGH (ref 11.5–15.5)
WBC Count: 5.9 10*3/uL (ref 4.0–10.5)
nRBC: 0 % (ref 0.0–0.2)

## 2024-02-29 LAB — CMP (CANCER CENTER ONLY)
ALT: 166 U/L — ABNORMAL HIGH (ref 0–44)
AST: 140 U/L — ABNORMAL HIGH (ref 15–41)
Albumin: 4.5 g/dL (ref 3.5–5.0)
Alkaline Phosphatase: 140 U/L — ABNORMAL HIGH (ref 38–126)
Anion gap: 11 (ref 5–15)
BUN: 16 mg/dL (ref 8–23)
CO2: 28 mmol/L (ref 22–32)
Calcium: 9 mg/dL (ref 8.9–10.3)
Chloride: 103 mmol/L (ref 98–111)
Creatinine: 1.03 mg/dL (ref 0.61–1.24)
GFR, Estimated: >60 mL/min
Glucose, Bld: 98 mg/dL (ref 70–99)
Potassium: 4.3 mmol/L (ref 3.5–5.1)
Sodium: 142 mmol/L (ref 135–145)
Total Bilirubin: 0.6 mg/dL (ref 0.0–1.2)
Total Protein: 7 g/dL (ref 6.5–8.1)

## 2024-02-29 LAB — IRON AND IRON BINDING CAPACITY (CC-WL,HP ONLY)
Iron: 86 ug/dL (ref 45–182)
Saturation Ratios: 24 % (ref 17.9–39.5)
TIBC: 364 ug/dL (ref 250–450)
UIBC: 279 ug/dL

## 2024-02-29 LAB — CEA (ACCESS): CEA (CHCC): 2.98 ng/mL (ref 0.00–5.00)

## 2024-02-29 LAB — FERRITIN: Ferritin: 62 ng/mL (ref 24–336)

## 2024-02-29 LAB — SAMPLE TO BLOOD BANK

## 2024-02-29 NOTE — Progress Notes (Signed)
 [facility] Cancer Follow up:    [name], MD [address] [place] [address]   DIAGNOSIS: h/o rectal cancer, iron deficiency anemia   SUMMARY OF ONCOLOGIC HISTORY: History of rectal cancer, status post resection with colostomy Rectal cancer resection with colostomy in 2014. Current anemia likely related to gastrointestinal bleeding  - Coordinated with gastroenterology for evaluation of potential bleeding source.   Severe iron deficiency anemia, likely secondary to GI bleeding.  Evaluated by Dr. Loretha on 12/29/2023 Feraheme  and blood transfusion given on 01/01/2024 GI evaluation with Dr. Legrand on 01/05/2024, endoscopy and colonoscopy scheduled 02/13/2023  CURRENT THERAPY:  INTERVAL HISTORY:  Discussed the use of AI scribe software for clinical note transcription with the patient, who gave verbal consent to proceed.  History of Present Illness Cody Coleman is a 79 year old male with rectal cancer status post resection with colostomy who presents for evaluation of severe iron deficiency anemia and gastrointestinal bleeding.  He is feeling much better since his last visit. He denies any complaints at all. His energy levels are better. He has not noted any hematochezia or melena.  Rest of the pertinent 10 point ROS reviewed and neg.   Patient Active Problem List   Diagnosis Date Noted   IDA (iron deficiency anemia) 06/23/2020   Recurrent incisional hernia with incarceration s/p lap repair w mesh 12/15/2016 12/15/2016   Chronic systolic CHF (congestive heart failure)  12/15/2016   Atrial fibrillation (HCC) 12/15/2016   Bradycardia - intermittent 12/15/2016   Chronic anticoagulation 12/15/2016   Sleep difficulties    CAD (coronary artery disease)    BPH (benign prostatic hyperplasia)    Parastomal hernia without obstruction or gangrene 12/14/2016   Recurrent Parastomal & Incisional hernias s/p lap repair w mesh 12/15/2016 11/22/2016   COPD exacerbation  (HCC) 01/21/2013   Constipation, chronic 01/20/2013   Rectal cancer s/p APR/colostomy 06/11/2012 05/03/2012   High cholesterol 04/12/2012   CAD (coronary artery disease) of artery bypass graft 04/12/2012    is allergic to nsaids.  MEDICAL HISTORY: Past Medical History:  Diagnosis Date   Atrial fibrillation (HCC) 12/15/2016   BPH (benign prostatic hyperplasia)    CAD (coronary artery disease)    Cancer (HCC)    RECTAL CANCER--SOME RECTAL BLEEDING   COPD (chronic obstructive pulmonary disease) (HCC)    Emphysema lung (HCC)    Former tobacco use    High cholesterol    MI (myocardial infarction) (HCC)    x 2  AT AGE 7 AND AT AGE 37  DR. MCLEAN IS PT'S MEDICAL DOCTOR   Pain    RIGHT KNEE PAIN AND OCCAS OTHER JOINT PAINS   Sleep difficulties    NEVER SLEEPS WELL - WAKES UP AFTER SEVERAL HOURS AND NOT ABLE TO GO BACK TO SLEEP--DID SLEEP STUDY AND TOLD HE DID NOT HAVE SLEEP APNEA   Urgency-frequency syndrome    FLOMAX  HAS HELPED    SURGICAL HISTORY: Past Surgical History:  Procedure Laterality Date   CARDIOVERSION N/A 10/21/2015   Procedure: CARDIOVERSION;  Surgeon: Ezra GORMAN Shuck, MD;  Location: [facility] ENDOSCOPY;  Service: Cardiovascular;  Laterality: N/A;   COLONOSCOPY N/A 04/20/2012   Procedure: COLONOSCOPY;  Surgeon: Claudis RAYMOND Rivet, MD;  Location: [facility] ENDO SUITE;  Service: Endoscopy;  Laterality: N/A;  225   COLONOSCOPY N/A 06/14/2013   Procedure: COLONOSCOPY;  Surgeon: Claudis RAYMOND Rivet, MD;  Location: [facility] ENDO SUITE;  Service: Endoscopy;  Laterality: N/A;  1245   COLOSTOMY REVISION N/A  01/17/2013   Procedure: COLOSTOMY REVISION;  Surgeon: Bernarda Ned, MD;  Location: [facility] OR;  Service: General;  Laterality: N/A;   CORONARY ANGIOPLASTY WITH STENT PLACEMENT     before bypass   CORONARY ARTERY BYPASS GRAFT     2005   EUS N/A 05/03/2012   Procedure: LOWER ENDOSCOPIC ULTRASOUND (EUS);  Surgeon: Toribio SHAUNNA Cedar, MD;  Location: [facility];  Service: Endoscopy;  Laterality: N/A;    INSERTION OF MESH N/A 10/25/2013   Procedure: INSERTION OF STRATTICE MESH;  Surgeon: Bernarda Ned, MD;  Location: [facility] ORS;  Service: General;  Laterality: N/A;   LAPAROSCOPIC ASSISTED ABDOMINAL PERINEAL RESECTION N/A 06/11/2012   Procedure: LAPAROSCOPIC ASSISTED ABDOMINAL PERINEAL RESECTION;  Surgeon: Bernarda Ned, MD;  Location: [facility] ORS;  Service: General;  Laterality: N/A;   LAPAROSCOPIC PARASTOMAL HERNIA N/A 10/25/2013   Procedure: ROBOTIC PARASTOMAL HERNIA REPAIR WITH STRATTICE MESH;  Surgeon: Bernarda Ned, MD;  Location: [facility] ORS;  Service: General;  Laterality: N/A;   PARASTOMAL HERNIA REPAIR N/A 12/14/2016   Procedure: PREPARE OF RECURRENT PERISTOMAL HERNIA WITH MESH WITH LAPAROSCOPIC LYSIS OF ADHESIONS, AND INCISIONAL HERNIA REPAIR WITH MESH;  Surgeon: Sheldon Standing, MD;  Location: [facility] ORS;  Service: General;  Laterality: N/A;   TEE WITHOUT CARDIOVERSION N/A 01/17/2018   Procedure: TRANSESOPHAGEAL ECHOCARDIOGRAM (TEE);  Surgeon: Rolan Ezra RAMAN, MD;  Location: [facility] ENDOSCOPY;  Service: Cardiovascular;  Laterality: N/A;   TONSILLECTOMY      SOCIAL HISTORY: Social History   Socioeconomic History   Marital status: Married    Spouse name: Not on file   Number of children: Not on file   Years of education: Not on file   Highest education level: Not on file  Occupational History   Not on file  Tobacco Use   Smoking status: Former    Current packs/day: 0.00    Average packs/day: 1 pack/day for 30.0 years (30.0 ttl pk-yrs)    Types: Cigarettes    Start date: 02/01/1972    Quit date: 01/31/2002    Years since quitting: 22.0   Smokeless tobacco: Never  Vaping Use   Vaping status: Never Used  Substance and Sexual Activity   Alcohol use: Yes    Comment: rarely   Drug use: No   Sexual activity: Never  Other Topics Concern   Not on file  Social History Narrative   Lives with wife.  Drives, no assist devices.   Social Drivers of Health   Tobacco Use: Medium Risk (02/13/2024)   Patient  History    Smoking Tobacco Use: Former    Smokeless Tobacco Use: Never    Passive Exposure: Not on file  Financial Resource Strain: Low Risk (02/05/2024)   Overall Financial Resource Strain (CARDIA)    Difficulty of Paying Living Expenses: Not hard at all  Food Insecurity: No Food Insecurity (02/05/2024)   Epic    Worried About Programme Researcher, Broadcasting/film/video in the Last Year: Never true    Ran Out of Food in the Last Year: Never true  Transportation Needs: No Transportation Needs (02/05/2024)   Epic    Lack of Transportation (Medical): No    Lack of Transportation (Non-Medical): No  Physical Activity: Inactive (02/05/2024)   Exercise Vital Sign    Days of Exercise per Week: 0 days    Minutes of Exercise per Session: 0 min  Stress: No Stress Concern Present (02/05/2024)   [employer] of Occupational Health - Occupational Stress Questionnaire    Feeling of Stress: Only a little  Social Connections: Moderately Isolated (02/05/2024)   Social Connection and Isolation Panel    Frequency of Communication with Friends and Family: Twice a week    Frequency of Social Gatherings with Friends and Family: Three times a week    Attends Religious Services: Never    Active Member of Clubs or Organizations: No    Attends Banker Meetings: Never    Marital Status: Married  Catering Manager Violence: Not At Risk (02/05/2024)   Epic    Fear of Current or Ex-Partner: No    Emotionally Abused: No    Physically Abused: No    Sexually Abused: No  Depression (PHQ2-9): Low Risk (02/05/2024)   Depression (PHQ2-9)    PHQ-2 Score: 0  Alcohol Screen: Low Risk (02/05/2024)   Alcohol Screen    Last Alcohol Screening Score (AUDIT): 0  Housing: Unknown (02/05/2024)   Epic    Unable to Pay for Housing in the Last Year: No    Number of Times Moved in the Last Year: Not on file    Homeless in the Last Year: No  Utilities: Not At Risk (02/05/2024)   Epic    Threatened with loss of utilities: No  Health Literacy:  Adequate Health Literacy (02/05/2024)   B1300 Health Literacy    Frequency of need for help with medical instructions: Never    FAMILY HISTORY: Family History  Problem Relation Age of Onset   Congestive Heart Failure Mother    Stroke Father    Coronary artery disease Father    Coronary artery disease Brother    Colon cancer Neg Hx     Review of Systems  Constitutional:  Negative for appetite change, chills, fatigue, fever and unexpected weight change.  HENT:   Negative for hearing loss, lump/mass and trouble swallowing.   Eyes:  Negative for eye problems and icterus.  Respiratory:  Negative for chest tightness, cough and shortness of breath.   Cardiovascular:  Negative for chest pain, leg swelling and palpitations.  Gastrointestinal:  Negative for abdominal distention, abdominal pain, constipation, diarrhea, nausea and vomiting.  Endocrine: Negative for hot flashes.  Genitourinary:  Negative for difficulty urinating.   Musculoskeletal:  Negative for arthralgias.  Skin:  Negative for itching and rash.  Neurological:  Negative for dizziness, extremity weakness, headaches and numbness.  Hematological:  Negative for adenopathy. Does not bruise/bleed easily.  Psychiatric/Behavioral:  Negative for depression. The patient is not nervous/anxious.       PHYSICAL EXAMINATION   Vitals:   02/29/24 1022  BP: 129/61  Pulse: 63  Resp: 18  Temp: 98.1 F (36.7 C)  SpO2: 97%     Physical Exam Constitutional:      General: He is not in acute distress.    Appearance: Normal appearance. He is not toxic-appearing.  HENT:     Head: Normocephalic and atraumatic.     Mouth/Throat:     Mouth: Mucous membranes are moist.     Pharynx: Oropharynx is clear. No oropharyngeal exudate or posterior oropharyngeal erythema.  Eyes:     General: No scleral icterus. Cardiovascular:     Rate and Rhythm: Normal rate and regular rhythm.     Pulses: Normal pulses.     Heart sounds: Normal heart  sounds.  Pulmonary:     Effort: Pulmonary effort is normal.     Breath sounds: Normal breath sounds.  Abdominal:     General: Abdomen is flat. Bowel sounds are normal. There is no distension.  Palpations: Abdomen is soft.     Tenderness: There is no abdominal tenderness.  Musculoskeletal:        General: No swelling.     Cervical back: Neck supple.  Lymphadenopathy:     Cervical: No cervical adenopathy.  Skin:    General: Skin is warm and dry.     Findings: No rash.  Neurological:     General: No focal deficit present.     Mental Status: He is alert.  Psychiatric:        Mood and Affect: Mood normal.        Behavior: Behavior normal.     LABORATORY DATA:  CBC    Component Value Date/Time   WBC 5.9 02/29/2024 0951   WBC 5.1 12/29/2023 1123   RBC 4.22 02/29/2024 0951   HGB 11.7 (L) 02/29/2024 0951   HCT 35.9 (L) 02/29/2024 0951   PLT 173 02/29/2024 0951   MCV 85.1 02/29/2024 0951   MCH 27.7 02/29/2024 0951   MCHC 32.6 02/29/2024 0951   RDW 19.3 (H) 02/29/2024 0951   LYMPHSABS 0.9 02/29/2024 0951   MONOABS 0.7 02/29/2024 0951   EOSABS 0.3 02/29/2024 0951   BASOSABS 0.0 02/29/2024 0951    CMP     Component Value Date/Time   NA 139 01/15/2024 1335   K 4.9 01/15/2024 1335   CL 99 01/15/2024 1335   CO2 30 01/15/2024 1335   GLUCOSE 95 01/15/2024 1335   BUN 22 01/15/2024 1335   CREATININE 1.31 (H) 01/15/2024 1335   CREATININE 0.77 11/18/2015 1454   CALCIUM  9.3 01/15/2024 1335   PROT 7.2 01/15/2024 1335   ALBUMIN 4.7 01/15/2024 1335   AST 27 01/15/2024 1335   ALT 16 01/15/2024 1335   ALKPHOS 57 01/15/2024 1335   BILITOT 0.5 01/15/2024 1335   GFRNONAA 56 (L) 01/15/2024 1335   GFRAA >60 06/09/2018 1314     ASSESSMENT and THERAPY PLAN:    Assessment and Plan Assessment & Plan Iron deficiency anemia secondary to gastrointestinal bleeding Iron deficiency anemia likely due to gastrointestinal bleeding. Hemoglobin improved to 11.4 g/dL post Feraheme and  transfusion. Maintained on oral iron without adverse effects- Continue oral BID - Recent endoscopy and colonoscopy showed AVM in the cecum which may or may not have contributed to his anemia. - CBC today showed improvement, Ok to continue iron - Continue surveillance with labs - Pt encouraged to contact us  immediately.  Rectal cancer, status post resection with colostomy Rectal cancer post resection with colostomy in 2014. No recurrence symptoms.    RTC as scheduled.  All questions were answered. The patient knows to call the clinic with any problems, questions or concerns. We can certainly see the patient much sooner if necessary.  Total encounter time:30 minutes*in face-to-face visit time, chart review, lab review, care coordination, order entry, and documentation of the encounter time.   *Total Encounter Time as defined by the Centers for Medicare and Medicaid Services includes, in addition to the face-to-face time of a patient visit (documented in the note above) non-face-to-face time: obtaining and reviewing outside history, ordering and reviewing medications, tests or procedures, care coordination (communications with other health care professionals or caregivers) and documentation in the medical record.

## 2024-03-01 ENCOUNTER — Ambulatory Visit: Payer: Self-pay | Admitting: Hematology and Oncology

## 2024-03-01 ENCOUNTER — Telehealth: Payer: Self-pay

## 2024-03-01 NOTE — Telephone Encounter (Signed)
 Lmtcb and schedule capsule endo

## 2024-03-03 ENCOUNTER — Encounter (HOSPITAL_COMMUNITY): Payer: Self-pay | Admitting: Family Medicine

## 2024-03-03 ENCOUNTER — Encounter: Payer: Self-pay | Admitting: Hematology and Oncology

## 2024-03-05 ENCOUNTER — Encounter (HOSPITAL_COMMUNITY): Payer: Self-pay | Admitting: Family Medicine

## 2024-03-05 ENCOUNTER — Encounter: Payer: Self-pay | Admitting: Hematology and Oncology

## 2024-03-05 NOTE — Telephone Encounter (Signed)
 Per [place] MD rn attempted to call pt x1, LVM stating that his liver enzymes were elevated and that his GI was notified and should be reaching out to him within the next few days to f/u and to call us  if he doesn't hear from them.

## 2024-03-05 NOTE — Telephone Encounter (Signed)
-----   Message from Amber Stalls, MD sent at 03/01/2024  4:49 PM EST ----- His liver enzymes are elevated, please forward it to his gastroenterologist.  Thanks, ----- Message ----- From: Crawford Morna Pickle, NP Sent: 03/01/2024   7:13 AM EST To: Amber Stalls, MD

## 2024-03-06 NOTE — Addendum Note (Signed)
 Addended by: Brenee Gajda E on: 03/06/2024 02:24 PM   Modules accepted: Orders

## 2024-03-07 ENCOUNTER — Other Ambulatory Visit: Payer: Self-pay | Admitting: Gastroenterology

## 2024-03-07 DIAGNOSIS — R7989 Other specified abnormal findings of blood chemistry: Secondary | ICD-10-CM

## 2024-04-25 ENCOUNTER — Inpatient Hospital Stay

## 2024-04-25 ENCOUNTER — Inpatient Hospital Stay: Admitting: Adult Health
